# Patient Record
Sex: Female | Born: 1995 | Race: Black or African American | Hispanic: No | Smoking: Never smoker
Health system: Southern US, Community
[De-identification: ages and names within clinical notes are randomized; demographics above are authoritative.]

## PROBLEM LIST (undated history)

## (undated) ENCOUNTER — Ambulatory Visit (HOSPITAL_COMMUNITY): Admission: EM | Payer: Medicaid Other

## (undated) ENCOUNTER — Inpatient Hospital Stay (HOSPITAL_COMMUNITY): Payer: Self-pay

## (undated) DIAGNOSIS — Z8249 Family history of ischemic heart disease and other diseases of the circulatory system: Secondary | ICD-10-CM

## (undated) DIAGNOSIS — M549 Dorsalgia, unspecified: Secondary | ICD-10-CM

## (undated) DIAGNOSIS — F909 Attention-deficit hyperactivity disorder, unspecified type: Secondary | ICD-10-CM

## (undated) DIAGNOSIS — B009 Herpesviral infection, unspecified: Secondary | ICD-10-CM

## (undated) DIAGNOSIS — Z789 Other specified health status: Secondary | ICD-10-CM

## (undated) HISTORY — DX: Attention-deficit hyperactivity disorder, unspecified type: F90.9

## (undated) HISTORY — PX: NO PAST SURGERIES: SHX2092

## (undated) HISTORY — DX: Family history of ischemic heart disease and other diseases of the circulatory system: Z82.49

## (undated) HISTORY — DX: Dorsalgia, unspecified: M54.9

---
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
## 2016-05-01 ENCOUNTER — Encounter (HOSPITAL_COMMUNITY): Payer: Self-pay | Admitting: *Deleted

## 2016-05-01 ENCOUNTER — Emergency Department (HOSPITAL_COMMUNITY)
Admission: EM | Admit: 2016-05-01 | Discharge: 2016-05-01 | Disposition: A | Discharge status: Home / Self Care | Payer: Medicaid Other | Attending: Emergency Medicine | Admitting: Emergency Medicine

## 2016-05-01 DIAGNOSIS — N390 Urinary tract infection, site not specified: Secondary | ICD-10-CM

## 2016-05-01 DIAGNOSIS — Z3A01 Less than 8 weeks gestation of pregnancy: Secondary | ICD-10-CM | POA: Diagnosis not present

## 2016-05-01 DIAGNOSIS — O2341 Unspecified infection of urinary tract in pregnancy, first trimester: Secondary | ICD-10-CM | POA: Diagnosis not present

## 2016-05-01 DIAGNOSIS — O26891 Other specified pregnancy related conditions, first trimester: Secondary | ICD-10-CM | POA: Diagnosis present

## 2016-05-01 DIAGNOSIS — R531 Weakness: Secondary | ICD-10-CM | POA: Insufficient documentation

## 2016-05-01 DIAGNOSIS — Z3201 Encounter for pregnancy test, result positive: Secondary | ICD-10-CM | POA: Diagnosis not present

## 2016-05-01 LAB — CBC
HEMATOCRIT: 39.4 % (ref 36.0–46.0)
Hemoglobin: 13.1 g/dL (ref 12.0–15.0)
MCH: 28.4 pg (ref 26.0–34.0)
MCHC: 33.2 g/dL (ref 30.0–36.0)
MCV: 85.3 fL (ref 78.0–100.0)
Platelets: 249 10*3/uL (ref 150–400)
RBC: 4.62 MIL/uL (ref 3.87–5.11)
RDW: 14.3 % (ref 11.5–15.5)
WBC: 8.8 10*3/uL (ref 4.0–10.5)

## 2016-05-01 LAB — BASIC METABOLIC PANEL
Anion gap: 7 (ref 5–15)
BUN: 8 mg/dL (ref 6–20)
CO2: 22 mmol/L (ref 22–32)
Calcium: 9.5 mg/dL (ref 8.9–10.3)
Chloride: 105 mmol/L (ref 101–111)
Creatinine, Ser: 0.63 mg/dL (ref 0.44–1.00)
GFR calc Af Amer: >60 mL/min (ref >60)
GLUCOSE: 92 mg/dL (ref 65–99)
POTASSIUM: 3.6 mmol/L (ref 3.5–5.1)
Sodium: 134 mmol/L — ABNORMAL LOW (ref 135–145)

## 2016-05-01 LAB — URINALYSIS, ROUTINE W REFLEX MICROSCOPIC
BILIRUBIN URINE: NEGATIVE
GLUCOSE, UA: NEGATIVE mg/dL
HGB URINE DIPSTICK: NEGATIVE
KETONES UR: 15 mg/dL — AB
Nitrite: NEGATIVE
PH: 6.5 (ref 5.0–8.0)
Protein, ur: NEGATIVE mg/dL
Specific Gravity, Urine: 1.026 (ref 1.005–1.030)

## 2016-05-01 LAB — CBG MONITORING, ED: Glucose-Capillary: 92 mg/dL (ref 65–99)

## 2016-05-01 LAB — POC URINE PREG, ED: Preg Test, Ur: POSITIVE — AB

## 2016-05-01 LAB — URINE MICROSCOPIC-ADD ON: RBC / HPF: NONE SEEN RBC/hpf (ref 0–5)

## 2016-05-01 MED ORDER — CEPHALEXIN 500 MG PO CAPS: 500.0000 mg | ORAL_CAPSULE | Freq: Four times a day (QID) | ORAL | 0 refills | Status: DC

## 2016-05-01 MED ORDER — SODIUM CHLORIDE 0.9 % IV BOLUS (SEPSIS)
1000.0000 mL | Freq: Once | INTRAVENOUS | Status: AC
  Administered 2016-05-01: 1000 mL via INTRAVENOUS

## 2016-05-01 MED ORDER — DEXTROSE 5 % IV SOLN
1.0000 g | Freq: Once | INTRAVENOUS | Status: AC
  Administered 2016-05-01: 1 g via INTRAVENOUS
  Filled 2016-05-01: qty 10

## 2016-05-01 MED ORDER — ONDANSETRON HCL 4 MG/2ML IJ SOLN
4.0000 mg | Freq: Once | INTRAMUSCULAR | Status: AC
  Administered 2016-05-01: 4 mg via INTRAVENOUS
  Filled 2016-05-01: qty 2

## 2016-05-01 MED ORDER — PROMETHAZINE HCL 25 MG PO TABS: 25.0000 mg | ORAL_TABLET | Freq: Four times a day (QID) | ORAL | 0 refills | Status: DC | PRN

## 2016-05-01 NOTE — ED Notes (Signed)
Pt ambulatory and independent at discharge.  Verbalized understanding of discharge instructions 

## 2016-05-01 NOTE — Discharge Instructions (Signed)
Pregnancy test is positive. Follow-up with OB/GYN. Prescription for antibiotic for your urine. Also prescription for nausea medication.  Increase fluids.

## 2016-05-01 NOTE — ED Provider Notes (Signed)
[facility]-EMERGENCY DEPT Provider Note   CSN: [id number] Arrival date & time: 05/01/16  1742     History   Chief Complaint Chief Complaint  Patient presents with  . Weakness    HPI Chelsea Burgess is a 20 y.o. female.  Generalized weakness, blurred vision, mouth numbness, decreased appetite since yesterday. Last menstrual period September 17. No chronic medical conditions. She takes no medications. Patient reports no neurological deficits today. No fever, sweats, chills, vaginal bleeding, vaginal discharge, dysuria. Nothing makes symptoms better or worse.      History reviewed. No pertinent past medical history.  There are no active problems to display for this patient.   History reviewed. No pertinent surgical history.  OB History    No data available       Home Medications    Prior to Admission medications   Medication Sig Start Date End Date Taking? Authorizing Provider  cephALEXin (KEFLEX) 500 MG capsule Take 1 capsule (500 mg total) by mouth 4 (four) times daily. 05/01/16   Donnetta HutchingBrian Abria Vannostrand, MD  promethazine (PHENERGAN) 25 MG tablet Take 1 tablet (25 mg total) by mouth every 6 (six) hours as needed for nausea or vomiting. 05/01/16   Donnetta HutchingBrian Abdullahi Vallone, MD    Family History No family history on file.  Social History Social History  Substance Use Topics  . Smoking status: Never Smoker  . Smokeless tobacco: Never Used  . Alcohol use No     Allergies   Patient has no known allergies.   Review of Systems Review of Systems  All other systems reviewed and are negative.    Physical Exam Updated Vital Signs BP 110/82 (BP Location: Right Arm)   Pulse 76   Temp 97.9 F (36.6 C) (Oral)   Resp 21   Ht 5\' 7"  (1.702 m)   Wt 123 lb 6 oz (56 kg)   LMP 03/11/2016   SpO2 96%   BMI 19.32 kg/m   Physical Exam  Constitutional: She is oriented to person, place, and time. She appears well-developed and well-nourished.  Patient is not toxic appearing.  HENT:  Head:  Normocephalic and atraumatic.  Eyes: Conjunctivae are normal.  Neck: Neck supple.  Cardiovascular: Normal rate and regular rhythm.   Pulmonary/Chest: Effort normal and breath sounds normal.  Abdominal: Soft. Bowel sounds are normal.  Musculoskeletal: Normal range of motion.  Neurological: She is alert and oriented to person, place, and time.  Skin: Skin is warm and dry.  Psychiatric: She has a normal mood and affect. Her behavior is normal.  Nursing note and vitals reviewed.    ED Treatments / Results  Labs (all labs ordered are listed, but only abnormal results are displayed) Labs Reviewed  BASIC METABOLIC PANEL - Abnormal; Notable for the following:       Result Value   Sodium 134 (*)    All other components within normal limits  URINALYSIS, ROUTINE W REFLEX MICROSCOPIC (NOT AT [facility]) - Abnormal; Notable for the following:    APPearance CLOUDY (*)    Ketones, ur 15 (*)    Leukocytes, UA MODERATE (*)    All other components within normal limits  URINE MICROSCOPIC-ADD ON - Abnormal; Notable for the following:    Squamous Epithelial / LPF 0-5 (*)    Bacteria, UA FEW (*)    All other components within normal limits  POC URINE PREG, ED - Abnormal; Notable for the following:    Preg Test, Ur POSITIVE (*)    All other components  within normal limits  CBC  CBG MONITORING, ED    EKG  EKG Interpretation None       Radiology No results found.  Procedures Procedures (including critical care time)  Medications Ordered in ED Medications  sodium chloride 0.9 % bolus 1,000 mL (not administered)  ondansetron (ZOFRAN) injection 4 mg (4 mg Intravenous Given 05/01/16 2127)  cefTRIAXone (ROCEPHIN) 1 g in dextrose 5 % 50 mL IVPB (0 g Intravenous Stopped 05/01/16 2203)  sodium chloride 0.9 % bolus 1,000 mL (1,000 mLs Intravenous New Bag/Given 05/01/16 2128)     Initial Impression / Assessment and Plan / ED Course  I have reviewed the triage vital signs and the nursing  notes.  Pertinent labs & imaging results that were available during my care of the patient were reviewed by me and considered in my medical decision making (see chart for details).  Clinical Course    Patient is nontoxic-appearing. No neurological deficits. She feels better after IV fluids. Discussed her pregnancy test.  Discharge medications Keflex 500 mg qid and Phenergan 25 mg. She will get OB/GYN follow-up.   Final Clinical Impressions(s) / ED Diagnoses   Final diagnoses:  Less than [redacted] weeks gestation of pregnancy  Urinary tract infection without hematuria, site unspecified    New Prescriptions New Prescriptions   CEPHALEXIN (KEFLEX) 500 MG CAPSULE    Take 1 capsule (500 mg total) by mouth 4 (four) times daily.   PROMETHAZINE (PHENERGAN) 25 MG TABLET    Take 1 tablet (25 mg total) by mouth every 6 (six) hours as needed for nausea or vomiting.     Donnetta HutchingBrian Narcissus Detwiler, MD 05/01/16 2242

## 2016-05-01 NOTE — Progress Notes (Addendum)
Patient listed as having Medicaid insurance without a pcp.  Pcp listed on patient's insurance card is located at [facility], [phone]  [phone].  System updated.  EDCM spoke to patient at bedside who confirms her pcp is [facility] at [facility].

## 2016-05-01 NOTE — ED Triage Notes (Signed)
Pt reports she felt numbness in her right hand and entire mouth, blurred vision confused speech, and fatigue last night. Pt states she woke up today and her symptoms had resolved. Pt states she does feel weak and has a lack of appetite.

## 2016-10-03 ENCOUNTER — Encounter (HOSPITAL_COMMUNITY): Payer: Self-pay | Admitting: *Deleted

## 2016-10-03 ENCOUNTER — Emergency Department (HOSPITAL_COMMUNITY)
Admission: EM | Admit: 2016-10-03 | Discharge: 2016-10-03 | Disposition: A | Discharge status: Home / Self Care | Payer: Medicaid Other | Attending: Emergency Medicine | Admitting: Emergency Medicine

## 2016-10-03 DIAGNOSIS — Z79899 Other long term (current) drug therapy: Secondary | ICD-10-CM | POA: Insufficient documentation

## 2016-10-03 DIAGNOSIS — K0889 Other specified disorders of teeth and supporting structures: Secondary | ICD-10-CM | POA: Insufficient documentation

## 2016-10-03 MED ORDER — PENICILLIN V POTASSIUM 500 MG PO TABS: 500.0000 mg | ORAL_TABLET | Freq: Four times a day (QID) | ORAL | 0 refills | Status: AC

## 2016-10-03 MED ORDER — IBUPROFEN 800 MG PO TABS: 800.0000 mg | ORAL_TABLET | Freq: Three times a day (TID) | ORAL | 0 refills | Status: DC | PRN

## 2016-10-03 MED ORDER — PENICILLIN V POTASSIUM 500 MG PO TABS: 500.0000 mg | ORAL_TABLET | Freq: Once | ORAL | Status: DC

## 2016-10-03 NOTE — ED Provider Notes (Signed)
[facility]-EMERGENCY DEPT Provider Note   CSN: [id number] Arrival date & time: 10/03/16  2115  By signing my name below, I, Linna Darner, attest that this documentation has been prepared under the direction and in the presence of [facility], PA-C. Electronically Signed: Linna Darner, Scribe. 10/03/2016. 9:56 PM.  History   Chief Complaint Chief Complaint  Patient presents with  . Dental Pain    The history is provided by the patient. No language interpreter was used.     HPI Comments: Chelsea Burgess is a 21 y.o. female who presents to the Emergency Department complaining of constant, aching, throbbing, 8/10 dental pain beginning three days ago. She states she developed pain in her right lower teeth three days ago and pain in her right upper teeth yesterday. Patient tried Tylenol yesterday with no improvement of her pain and has not tried any medications or treatments today. She endorses pain exacerbation with applied pressure to several of her right teeth. No h/o the same. Her last dental cleaning was this past January and she was told she had no cavities. Pt states she is now without dental insurance and will not be able to attend her next scheduled cleaning appointment in three months. She is a non-smoker. Patient denies fevers, chills, trouble swallowing, or any other associated symptoms.  History reviewed. No pertinent past medical history.  There are no active problems to display for this patient.   History reviewed. No pertinent surgical history.  OB History    No data available       Home Medications    Prior to Admission medications   Medication Sig Start Date End Date Taking? Authorizing Provider  cephALEXin (KEFLEX) 500 MG capsule Take 1 capsule (500 mg total) by mouth 4 (four) times daily. 05/01/16   Donnetta Hutching, MD  ibuprofen (ADVIL,MOTRIN) 800 MG tablet Take 1 tablet (800 mg total) by mouth every 8 (eight) hours as needed. 10/03/16   Chase Picket Ward, PA-C    penicillin v potassium (VEETID) 500 MG tablet Take 1 tablet (500 mg total) by mouth 4 (four) times daily. 10/03/16 10/10/16  Chase Picket Ward, PA-C  promethazine (PHENERGAN) 25 MG tablet Take 1 tablet (25 mg total) by mouth every 6 (six) hours as needed for nausea or vomiting. 05/01/16   Donnetta Hutching, MD    Family History No family history on file.  Social History Social History  Substance Use Topics  . Smoking status: Never Smoker  . Smokeless tobacco: Never Used  . Alcohol use No     Allergies   Patient has no known allergies.   Review of Systems Review of Systems  Constitutional: Negative for chills and fever.  HENT: Positive for dental problem. Negative for trouble swallowing.    Physical Exam Updated Vital Signs BP 131/81 (BP Location: Right Arm)   Pulse 69   Temp 98.1 F (36.7 C) (Oral)   Resp 16   Ht  (1.702 m)   Wt 54.4 kg   SpO2 99%   BMI 18.79 kg/m   Physical Exam  Constitutional: She is oriented to person, place, and time. She appears well-developed and well-nourished. No distress.  HENT:  Head: Normocephalic and atraumatic.  Mouth/Throat:    Dental cavities and poor oral dentition noted, pain along tooth as depicted in image, midline uvula, no trismus, oropharynx moist and clear, no abscess noted, no oropharyngeal erythema or edema, neck supple and no tenderness. No facial edema  Cardiovascular: Normal rate, regular rhythm and normal heart  sounds.   No murmur heard. Pulmonary/Chest: Effort normal and breath sounds normal. No respiratory distress.  Musculoskeletal: She exhibits no edema.  Neurological: She is alert and oriented to person, place, and time.  Skin: Skin is warm and dry.  Nursing note and vitals reviewed.  ED Treatments / Results  Labs (all labs ordered are listed, but only abnormal results are displayed) Labs Reviewed - No data to display  EKG  EKG Interpretation None       Radiology No results  found.  Procedures Procedures (including critical care time)  DIAGNOSTIC STUDIES: Oxygen Saturation is 99% on RA, normal by my interpretation.    COORDINATION OF CARE: 10:00 PM Discussed treatment plan with pt at bedside and pt agreed to plan.  Medications Ordered in ED Medications  penicillin v potassium (VEETID) tablet 500 mg (not administered)     Initial Impression / Assessment and Plan / ED Course  I have reviewed the triage vital signs and the nursing notes.  Pertinent labs & imaging results that were available during my care of the patient were reviewed by me and considered in my medical decision making (see chart for details).    Patient with dentalgia. No abscess requiring immediate incision and drainage. Patient is afebrile, non toxic appearing, and swallowing secretions well. Exam not concerning for Ludwig's angina or pharyngeal abscess. Will treat with PenVK. I provided dental resource guide and stressed the importance of dental follow up for ultimate management of dental pain. Patient voices understanding and is agreeable to plan.   Final Clinical Impressions(s) / ED Diagnoses   Final diagnoses:  Pain, dental    New Prescriptions Discharge Medication List as of 10/03/2016 10:14 PM    START taking these medications   Details  ibuprofen (ADVIL,MOTRIN) 800 MG tablet Take 1 tablet (800 mg total) by mouth every 8 (eight) hours as needed., Starting Wed 10/03/2016, Print    penicillin v potassium (VEETID) 500 MG tablet Take 1 tablet (500 mg total) by mouth 4 (four) times daily., Starting Wed 10/03/2016, Until Wed 10/10/2016, Print      I personally performed the services described in this documentation, which was scribed in my presence. The recorded information has been reviewed and is accurate.    [facility], PA-C 10/03/16 2226    Nira Conn, MD 10/04/16 [phone]

## 2016-10-03 NOTE — ED Triage Notes (Signed)
Right upper and lower dental pain for 3 days. No meds just PTA

## 2016-10-03 NOTE — Discharge Instructions (Signed)
You have a dental infection. It is very important that you get evaluated by a dentist as soon as possible. Call tomorrow to schedule an appointment. Ibuprofen as needed for pain. Take your full course of antibiotics. Read the instructions below. ° °Eat a soft or liquid diet and rinse your mouth out after meals with warm water. You should see a dentist or return here at once if you have increased swelling, increased pain or uncontrolled bleeding from the site of your injury. ° °SEEK MEDICAL CARE IF:  °You have increased pain not controlled with medicines.  °You have swelling around your tooth, in your face or neck.  °You have bleeding which starts, continues, or gets worse.  °You have a fever >101 °If you are unable to open your mouth °

## 2017-05-17 ENCOUNTER — Other Ambulatory Visit: Payer: Self-pay

## 2017-05-17 ENCOUNTER — Emergency Department (HOSPITAL_COMMUNITY)
Admission: EM | Admit: 2017-05-17 | Discharge: 2017-05-17 | Disposition: A | Discharge status: Home / Self Care | Payer: Self-pay | Attending: Emergency Medicine | Admitting: Emergency Medicine

## 2017-05-17 ENCOUNTER — Encounter (HOSPITAL_COMMUNITY): Payer: Self-pay | Admitting: Emergency Medicine

## 2017-05-17 DIAGNOSIS — A6004 Herpesviral vulvovaginitis: Secondary | ICD-10-CM | POA: Insufficient documentation

## 2017-05-17 DIAGNOSIS — Z79899 Other long term (current) drug therapy: Secondary | ICD-10-CM | POA: Insufficient documentation

## 2017-05-17 LAB — URINALYSIS, ROUTINE W REFLEX MICROSCOPIC
Bilirubin Urine: NEGATIVE
Glucose, UA: NEGATIVE mg/dL
HGB URINE DIPSTICK: NEGATIVE
Ketones, ur: NEGATIVE mg/dL
NITRITE: NEGATIVE
Protein, ur: NEGATIVE mg/dL
SPECIFIC GRAVITY, URINE: 1.017 (ref 1.005–1.030)
pH: 5 (ref 5.0–8.0)

## 2017-05-17 LAB — PREGNANCY, URINE: PREG TEST UR: NEGATIVE

## 2017-05-17 MED ORDER — VALACYCLOVIR HCL 500 MG PO TABS
500.0000 mg | ORAL_TABLET | Freq: Once | ORAL | Status: AC
  Administered 2017-05-17: 500 mg via ORAL
  Filled 2017-05-17: qty 1

## 2017-05-17 MED ORDER — LIDOCAINE 5 % EX OINT: 1.0000 "application " | TOPICAL_OINTMENT | CUTANEOUS | 0 refills | Status: DC | PRN

## 2017-05-17 MED ORDER — VALACYCLOVIR HCL 500 MG PO TABS: 500.0000 mg | ORAL_TABLET | Freq: Two times a day (BID) | ORAL | 0 refills | Status: DC

## 2017-05-17 MED ORDER — TRAMADOL HCL 50 MG PO TABS: 50.0000 mg | ORAL_TABLET | Freq: Four times a day (QID) | ORAL | 0 refills | Status: DC | PRN

## 2017-05-17 NOTE — ED Provider Notes (Signed)
[facility] Provider Burgess   CSN: [id number] Arrival date & time: 05/17/17  0428     History   Chief Complaint Chief Complaint  Patient presents with  . Vaginal Pain    HPI Chelsea Burgess is a 21 y.o. female.  Patient presents with complaints of a 2-day history of painful blisters on her labia.  She reports a history of genital herpes with similar symptoms.  Patient denies any recent unprotected sex.  She has not had any vaginal discharge.  She reports constant pain at the site of the lesions.      History reviewed. No pertinent past medical history.  There are no active problems to display for this patient.   History reviewed. No pertinent surgical history.  OB History    No data available       Home Medications    Prior to Admission medications   Medication Sig Start Date End Date Taking? Authorizing Provider  cephALEXin (KEFLEX) 500 MG capsule Take 1 capsule (500 mg total) by mouth 4 (four) times daily. 05/01/16   Donnetta Hutchingook, Brian, MD  ibuprofen (ADVIL,MOTRIN) 800 MG tablet Take 1 tablet (800 mg total) by mouth every 8 (eight) hours as needed. 10/03/16   Ward, Chase PicketJaime Pilcher, PA-C  lidocaine (XYLOCAINE) 5 % ointment Apply 1 application topically as needed. For painful lesion 05/17/17   Gilda CreasePollina, Sherleen Pangborn J, MD  promethazine (PHENERGAN) 25 MG tablet Take 1 tablet (25 mg total) by mouth every 6 (six) hours as needed for nausea or vomiting. 05/01/16   Donnetta Hutchingook, Brian, MD  traMADol (ULTRAM) 50 MG tablet Take 1 tablet (50 mg total) by mouth every 6 (six) hours as needed. 05/17/17   Gilda CreasePollina, Donelle Baba J, MD  valACYclovir (VALTREX) 500 MG tablet Take 1 tablet (500 mg total) by mouth 2 (two) times daily. 05/17/17   Gilda CreasePollina, Kaysen Sefcik J, MD    Family History No family history on file.  Social History Social History   Tobacco Use  . Smoking status: Never Smoker  . Smokeless tobacco: Never Used  Substance Use Topics    . Alcohol use: No  . Drug use: No     Allergies   Patient has no known allergies.   Review of Systems Review of Systems  Genitourinary: Positive for genital sores.  All other systems reviewed and are negative.    Physical Exam Updated Vital Signs BP 126/73 (BP Location: Left Arm)   Pulse 80   Temp 97.6 F (36.4 C) (Oral)   Resp 18   LMP 05/07/2017   SpO2 100%   Physical Exam  Constitutional: She is oriented to person, place, and time. She appears well-developed and well-nourished. No distress.  HENT:  Head: Normocephalic and atraumatic.  Right Ear: Hearing normal.  Left Ear: Hearing normal.  Nose: Nose normal.  Mouth/Throat: Oropharynx is clear and moist and mucous membranes are normal.  Eyes: Conjunctivae and EOM are normal. Pupils are equal, round, and reactive to light.  Neck: Normal range of motion. Neck supple.  Cardiovascular: Regular rhythm, S1 normal and S2 normal. Exam reveals no gallop and no friction rub.  No murmur heard. Pulmonary/Chest: Effort normal and breath sounds normal. No respiratory distress. She exhibits no tenderness.  Abdominal: Soft. Normal appearance and bowel sounds are normal. There is no hepatosplenomegaly. There is no tenderness. There is no rebound, no guarding, no tenderness at McBurney's point and negative Murphy's sign. No hernia.  Genitourinary:  Genitourinary Comments: Physical examination reveals ulcerated area  of bilateral labia consistent with herpetic lesions.  Speculum exam deferred secondary to external pain.  Musculoskeletal: Normal range of motion.  Neurological: She is alert and oriented to person, place, and time. She has normal strength. No cranial nerve deficit or sensory deficit. Coordination normal. GCS eye subscore is 4. GCS verbal subscore is 5. GCS motor subscore is 6.  Skin: Skin is warm, dry and intact. No rash noted. No cyanosis.  Psychiatric: She has a normal mood and affect. Her speech is normal and behavior is  normal. Thought content normal.  Nursing Burgess and vitals reviewed.    ED Treatments / Results  Labs (all labs ordered are listed, but only abnormal results are displayed) Labs Reviewed  URINALYSIS, ROUTINE W REFLEX MICROSCOPIC - Abnormal; Notable for the following components:      Result Value   APPearance HAZY (*)    Leukocytes, UA SMALL (*)    Bacteria, UA RARE (*)    Squamous Epithelial / LPF 6-30 (*)    All other components within normal limits  PREGNANCY, URINE  GC/CHLAMYDIA PROBE AMP ([place]) NOT [facility]    EKG  EKG Interpretation None       Radiology No results found.  Procedures Procedures (including critical care time)  Medications Ordered in ED Medications  valACYclovir (VALTREX) tablet 500 mg (not administered)     Initial Impression / Assessment and Plan / ED Course  I have reviewed the triage vital signs and the nursing notes.  Pertinent labs & imaging results that were available during my care of the patient were reviewed by me and considered in my medical decision making (see chart for details).     Patient presents with genital lesions consistent with genital herpes.  Full examination not possible secondary to multiple lesions and pain.  She denies any recent sexual activity, no signs of PID or other STD.  Will add GC and chlamydia to urine collected.  Final Clinical Impressions(s) / ED Diagnoses   Final diagnoses:  Herpes simplex vulvovaginitis    ED Discharge Orders        Ordered    valACYclovir (VALTREX) 500 MG tablet  2 times daily     05/17/17 0634    traMADol (ULTRAM) 50 MG tablet  Every 6 hours PRN     05/17/17 0634    lidocaine (XYLOCAINE) 5 % ointment  As needed     05/17/17 0634       Gilda CreasePollina, Dhruvan Gullion J, MD 05/17/17 [phone]

## 2017-05-17 NOTE — ED Triage Notes (Signed)
Pt complaint of vaginal blisters for 2 days; verbalizes "I think I have an outbreak and need medicine."

## 2017-05-20 LAB — GC/CHLAMYDIA PROBE AMP (~~LOC~~) NOT AT ARMC
CHLAMYDIA, DNA PROBE: POSITIVE — AB
Neisseria Gonorrhea: NEGATIVE

## 2017-05-23 ENCOUNTER — Telehealth: Payer: Self-pay | Admitting: Student

## 2017-05-23 DIAGNOSIS — A749 Chlamydial infection, unspecified: Secondary | ICD-10-CM

## 2017-05-23 MED ORDER — AZITHROMYCIN 500 MG PO TABS: 1000.0000 mg | ORAL_TABLET | Freq: Once | ORAL | 0 refills | Status: AC

## 2017-05-23 NOTE — Telephone Encounter (Addendum)
Chelsea Burgess tested positive for  Chlamydia. Patient was called by RN and allergies and pharmacy confirmed. Rx sent to pharmacy of choice.   Judeth HornLawrence, Torsha Lemus, NP 05/23/2017 2:15 PM       ----- Message from Kathe BectonLori S Berdik, RN sent at 05/23/2017 11:38 AM EST ----- This patient tested positive for :  chlamydia   She :"has NKDA", I have informed the patient of her results and confirmed her pharmacy is correct in her chart. Please send Rx.   Thank you,   Kathe BectonBerdik, Lori S, RN   [facility]

## 2017-12-21 ENCOUNTER — Emergency Department (HOSPITAL_COMMUNITY): Payer: Self-pay

## 2017-12-21 ENCOUNTER — Emergency Department (HOSPITAL_COMMUNITY)
Admission: EM | Admit: 2017-12-21 | Discharge: 2017-12-21 | Disposition: A | Discharge status: Home / Self Care | Payer: Self-pay | Attending: Emergency Medicine | Admitting: Emergency Medicine

## 2017-12-21 ENCOUNTER — Other Ambulatory Visit: Payer: Self-pay

## 2017-12-21 ENCOUNTER — Encounter (HOSPITAL_COMMUNITY): Payer: Self-pay | Admitting: Emergency Medicine

## 2017-12-21 DIAGNOSIS — N390 Urinary tract infection, site not specified: Secondary | ICD-10-CM | POA: Insufficient documentation

## 2017-12-21 DIAGNOSIS — N83201 Unspecified ovarian cyst, right side: Secondary | ICD-10-CM | POA: Insufficient documentation

## 2017-12-21 DIAGNOSIS — R52 Pain, unspecified: Secondary | ICD-10-CM

## 2017-12-21 DIAGNOSIS — B373 Candidiasis of vulva and vagina: Secondary | ICD-10-CM | POA: Insufficient documentation

## 2017-12-21 DIAGNOSIS — B3731 Acute candidiasis of vulva and vagina: Secondary | ICD-10-CM

## 2017-12-21 DIAGNOSIS — N72 Inflammatory disease of cervix uteri: Secondary | ICD-10-CM | POA: Insufficient documentation

## 2017-12-21 LAB — URINALYSIS, ROUTINE W REFLEX MICROSCOPIC
Bilirubin Urine: NEGATIVE
GLUCOSE, UA: NEGATIVE mg/dL
KETONES UR: NEGATIVE mg/dL
NITRITE: POSITIVE — AB
PH: 6 (ref 5.0–8.0)
Protein, ur: NEGATIVE mg/dL
Specific Gravity, Urine: 1.012 (ref 1.005–1.030)

## 2017-12-21 LAB — COMPREHENSIVE METABOLIC PANEL
ALT: 12 U/L (ref 0–44)
AST: 14 U/L — AB (ref 15–41)
Albumin: 3.8 g/dL (ref 3.5–5.0)
Alkaline Phosphatase: 59 U/L (ref 38–126)
Anion gap: 7 (ref 5–15)
BILIRUBIN TOTAL: 0.2 mg/dL — AB (ref 0.3–1.2)
BUN: 8 mg/dL (ref 6–20)
CO2: 24 mmol/L (ref 22–32)
CREATININE: 0.69 mg/dL (ref 0.44–1.00)
Calcium: 9.4 mg/dL (ref 8.9–10.3)
Chloride: 107 mmol/L (ref 98–111)
GFR calc Af Amer: >60 mL/min (ref >60)
GFR calc non Af Amer: >60 mL/min (ref >60)
Glucose, Bld: 116 mg/dL — ABNORMAL HIGH (ref 70–99)
Potassium: 3.4 mmol/L — ABNORMAL LOW (ref 3.5–5.1)
Sodium: 138 mmol/L (ref 135–145)
TOTAL PROTEIN: 6.7 g/dL (ref 6.5–8.1)

## 2017-12-21 LAB — CBC WITH DIFFERENTIAL/PLATELET
ABS IMMATURE GRANULOCYTES: 0 10*3/uL (ref 0.0–0.1)
Basophils Absolute: 0 10*3/uL (ref 0.0–0.1)
Basophils Relative: 0 %
EOS ABS: 0.1 10*3/uL (ref 0.0–0.7)
Eosinophils Relative: 2 %
HEMATOCRIT: 41 % (ref 36.0–46.0)
Hemoglobin: 13.2 g/dL (ref 12.0–15.0)
IMMATURE GRANULOCYTES: 0 %
LYMPHS ABS: 1.7 10*3/uL (ref 0.7–4.0)
Lymphocytes Relative: 21 %
MCH: 28.6 pg (ref 26.0–34.0)
MCHC: 32.2 g/dL (ref 30.0–36.0)
MCV: 88.7 fL (ref 78.0–100.0)
MONO ABS: 0.6 10*3/uL (ref 0.1–1.0)
MONOS PCT: 7 %
NEUTROS PCT: 70 %
Neutro Abs: 5.7 10*3/uL (ref 1.7–7.7)
Platelets: 255 10*3/uL (ref 150–400)
RBC: 4.62 MIL/uL (ref 3.87–5.11)
RDW: 13 % (ref 11.5–15.5)
WBC: 8.1 10*3/uL (ref 4.0–10.5)

## 2017-12-21 LAB — POC URINE PREG, ED: Preg Test, Ur: NEGATIVE

## 2017-12-21 LAB — WET PREP, GENITAL
Sperm: NONE SEEN
Trich, Wet Prep: NONE SEEN

## 2017-12-21 LAB — LIPASE, BLOOD: LIPASE: 32 U/L (ref 11–51)

## 2017-12-21 MED ORDER — KETOROLAC TROMETHAMINE 60 MG/2ML IM SOLN
60.0000 mg | Freq: Once | INTRAMUSCULAR | Status: AC
  Administered 2017-12-21: 60 mg via INTRAMUSCULAR
  Filled 2017-12-21: qty 2

## 2017-12-21 MED ORDER — AZITHROMYCIN 250 MG PO TABS
500.0000 mg | ORAL_TABLET | Freq: Once | ORAL | Status: AC
  Administered 2017-12-21: 500 mg via ORAL
  Filled 2017-12-21: qty 2

## 2017-12-21 MED ORDER — CEFTRIAXONE SODIUM 250 MG IJ SOLR
250.0000 mg | Freq: Once | INTRAMUSCULAR | Status: AC
  Administered 2017-12-21: 250 mg via INTRAMUSCULAR
  Filled 2017-12-21: qty 250

## 2017-12-21 MED ORDER — IBUPROFEN 600 MG PO TABS: 600.0000 mg | ORAL_TABLET | Freq: Four times a day (QID) | ORAL | 0 refills | Status: DC | PRN

## 2017-12-21 MED ORDER — CEPHALEXIN 500 MG PO CAPS: 500.0000 mg | ORAL_CAPSULE | Freq: Three times a day (TID) | ORAL | 0 refills | Status: DC

## 2017-12-21 MED ORDER — METRONIDAZOLE 500 MG PO TABS: 500.0000 mg | ORAL_TABLET | Freq: Two times a day (BID) | ORAL | 0 refills | Status: DC

## 2017-12-21 MED ORDER — FLUCONAZOLE 150 MG PO TABS
150.0000 mg | ORAL_TABLET | Freq: Once | ORAL | Status: AC
  Administered 2017-12-21: 150 mg via ORAL
  Filled 2017-12-21: qty 1

## 2017-12-21 NOTE — ED Triage Notes (Signed)
Pt presents to ED for assessment of 3 days of right flank pain with some radiation to her right abdomen.  Denies changes in urination

## 2017-12-21 NOTE — ED Notes (Signed)
Pt ambulatory and verbalizes understanding of dc instructions. No s/sx distress noted.

## 2017-12-21 NOTE — ED Provider Notes (Signed)
[facility] EMERGENCY DEPARTMENT Provider Note   CSN: [id number] Arrival date & time: 12/21/17  1514     History   Chief Complaint Chief Complaint  Patient presents with  . Flank Pain    HPI Chelsea Burgess is a 22 y.o. female.  HPI Patient presents with 3 days of right-sided abdominal pain radiating to her right flank.  States the pain is been episodic but then became constant.  Not associated with eating.  No nausea or vomiting.  No vaginal discharge or bleeding.  Denies dysuria, hematuria, frequency or urgency.  No previous abdominal surgeries.  Patient is sexually active and is inconsistent with using barrier protection. History reviewed. No pertinent past medical history.  There are no active problems to display for this patient.   History reviewed. No pertinent surgical history.   OB History   None      Home Medications    Prior to Admission medications   Medication Sig Start Date End Date Taking? Authorizing Provider  cephALEXin (KEFLEX) 500 MG capsule Take 1 capsule (500 mg total) by mouth 3 (three) times daily. 12/21/17   Loren Racer, MD  ibuprofen (ADVIL,MOTRIN) 600 MG tablet Take 1 tablet (600 mg total) by mouth every 6 (six) hours as needed. 12/21/17   Loren Racer, MD  metroNIDAZOLE (FLAGYL) 500 MG tablet Take 1 tablet (500 mg total) by mouth 2 (two) times daily. One po bid x 7 days 12/21/17   Loren Racer, MD    Family History History reviewed. No pertinent family history.  Social History Social History   Tobacco Use  . Smoking status: Never Smoker  . Smokeless tobacco: Never Used  Substance Use Topics  . Alcohol use: Not Currently  . Drug use: Never     Allergies   Patient has no known allergies.   Review of Systems Review of Systems  Constitutional: Negative for chills and fever.  Eyes: Negative for visual disturbance.  Respiratory: Negative for cough and shortness of breath.   Cardiovascular: Negative for  chest pain.  Gastrointestinal: Positive for abdominal pain. Negative for constipation, diarrhea, nausea and vomiting.  Genitourinary: Positive for flank pain. Negative for difficulty urinating, dyspareunia, dysuria, frequency, menstrual problem, vaginal bleeding and vaginal discharge.  Musculoskeletal: Positive for back pain and myalgias. Negative for neck pain and neck stiffness.  Skin: Negative for rash and wound.  Neurological: Negative for dizziness, weakness, numbness and headaches.  All other systems reviewed and are negative.    Physical Exam Updated Vital Signs BP 133/87 (BP Location: Right Arm)   Pulse 67   Temp 98.5 F (36.9 C) (Oral)   Resp 18   LMP 12/09/2017   SpO2 100%   Physical Exam  Constitutional: She is oriented to person, place, and time. She appears well-developed and well-nourished.  HENT:  Head: Normocephalic and atraumatic.  Mouth/Throat: Oropharynx is clear and moist.  Eyes: Pupils are equal, round, and reactive to light. EOM are normal.  Neck: Normal range of motion. Neck supple.  Cardiovascular: Normal rate and regular rhythm.  Pulmonary/Chest: Effort normal and breath sounds normal.  Abdominal: Soft. Bowel sounds are normal. There is tenderness. There is no rebound and no guarding.  Patient with mild right upper and lower quadrant tenderness to palpation without rebound or guarding.    Genitourinary:  Genitourinary Comments: Red cervix with moderate amount of frothy yellow discharge.  No cervical motion tenderness.  No adnexal or fundal tenderness.  Musculoskeletal: Normal range of motion. She exhibits no edema  or tenderness.  Question mild left CVA tenderness.  No midline thoracic or lumbar tenderness.  No lower extremity swelling, asymmetry or tenderness.  Neurological: She is alert and oriented to person, place, and time.  Moves all extremities without focal deficit.  Sensation intact.  Skin: Skin is warm and dry. Capillary refill takes less than 2  seconds. No rash noted. No erythema.  Psychiatric: She has a normal mood and affect. Her behavior is normal.  Nursing note and vitals reviewed.    ED Treatments / Results  Labs (all labs ordered are listed, but only abnormal results are displayed) Labs Reviewed  WET PREP, GENITAL - Abnormal; Notable for the following components:      Result Value   Yeast Wet Prep HPF POC PRESENT (*)    Clue Cells Wet Prep HPF POC PRESENT (*)    WBC, Wet Prep HPF POC MANY (*)    All other components within normal limits  URINALYSIS, ROUTINE W REFLEX MICROSCOPIC - Abnormal; Notable for the following components:   APPearance HAZY (*)    Hgb urine dipstick SMALL (*)    Nitrite POSITIVE (*)    Leukocytes, UA MODERATE (*)    WBC, UA >50 (*)    Bacteria, UA FEW (*)    All other components within normal limits  COMPREHENSIVE METABOLIC PANEL - Abnormal; Notable for the following components:   Potassium 3.4 (*)    Glucose, Bld 116 (*)    AST 14 (*)    Total Bilirubin 0.2 (*)    All other components within normal limits  URINE CULTURE  CBC WITH DIFFERENTIAL/PLATELET  LIPASE, BLOOD  POC URINE PREG, ED  GC/CHLAMYDIA PROBE AMP ([place]    EKG None  Radiology [place] Pelvis Transvanginal Non-ob (tv Only)  Result Date: 12/21/2017 CLINICAL DATA:  22 year old female with right lower quadrant abdominal pain. EXAM: TRANSABDOMINAL AND TRANSVAGINAL ULTRASOUND OF PELVIS TECHNIQUE: Both transabdominal and transvaginal ultrasound examinations of the pelvis were performed. Transabdominal technique was performed for global imaging of the pelvis including uterus, ovaries, adnexal regions, and pelvic cul-de-sac. It was necessary to proceed with endovaginal exam following the transabdominal exam to visualize the endometrium and ovaries. COMPARISON:  Abdominal CT dated 12/21/2017 FINDINGS: Uterus Measurements: 7.1 x 4.2 x 4.7 cm. The uterus is retroverted. Normal echogenicity. No mass. Endometrium Thickness:  10 mm.  No focal abnormality visualized. Right ovary Measurements: 3.3 x 1.7 x 2.2 cm. There is a 1.4 x 0.8 x 1.3 cm hemorrhagic corpus luteum in the right ovary. Left ovary Measurements: 2.8 x 1.0 x 1.1 cm. Normal appearance/no adnexal mass. Other findings Small amount of minimally complex fluid within the pelvis IMPRESSION: 1. Right ovarian complex/hemorrhagic corpus luteum with a small free fluid within the pelvis. 2. Unremarkable uterus and the left ovary. Electronically Signed   By: Elgie CollardArash  Radparvar M.D.   On: 12/21/2017 21:16   [place] Pelvis Complete  Result Date: 12/21/2017 CLINICAL DATA:  22 year old female with right lower quadrant abdominal pain. EXAM: TRANSABDOMINAL AND TRANSVAGINAL ULTRASOUND OF PELVIS TECHNIQUE: Both transabdominal and transvaginal ultrasound examinations of the pelvis were performed. Transabdominal technique was performed for global imaging of the pelvis including uterus, ovaries, adnexal regions, and pelvic cul-de-sac. It was necessary to proceed with endovaginal exam following the transabdominal exam to visualize the endometrium and ovaries. COMPARISON:  Abdominal CT dated 12/21/2017 FINDINGS: Uterus Measurements: 7.1 x 4.2 x 4.7 cm. The uterus is retroverted. Normal echogenicity. No mass. Endometrium Thickness: 10 mm.  No focal  abnormality visualized. Right ovary Measurements: 3.3 x 1.7 x 2.2 cm. There is a 1.4 x 0.8 x 1.3 cm hemorrhagic corpus luteum in the right ovary. Left ovary Measurements: 2.8 x 1.0 x 1.1 cm. Normal appearance/no adnexal mass. Other findings Small amount of minimally complex fluid within the pelvis IMPRESSION: 1. Right ovarian complex/hemorrhagic corpus luteum with a small free fluid within the pelvis. 2. Unremarkable uterus and the left ovary. Electronically Signed   By: Elgie Collard M.D.   On: 12/21/2017 21:16   Ct Renal Stone Study  Result Date: 12/21/2017 CLINICAL DATA:  Right flank pain. EXAM: CT ABDOMEN AND PELVIS WITHOUT CONTRAST TECHNIQUE:  Multidetector CT imaging of the abdomen and pelvis was performed following the standard protocol without IV contrast. COMPARISON:  None. FINDINGS: Lower chest: No acute abnormality. Hepatobiliary: No focal liver abnormality is seen. No gallstones, gallbladder wall thickening, or biliary dilatation. Pancreas: Unremarkable. No pancreatic ductal dilatation or surrounding inflammatory changes. Spleen: Normal in size without focal abnormality. Adrenals/Urinary Tract: Adrenal glands are unremarkable. Kidneys are normal, without renal calculi, focal lesion, or hydronephrosis. Bladder is unremarkable. Stomach/Bowel: The stomach and small bowel are normal. The colon and visualized portions of the appendix are normal. No secondary evidence of appendicitis. Vascular/Lymphatic: No significant vascular findings are present. No enlarged abdominal or pelvic lymph nodes. Reproductive: The uterus and left ovary are normal. No right ovarian mass noted. There is a small amount of fluid posteriorly in the right pelvis, likely posterior to the right ovary. Other: There is a small amount of fluid in the pelvis, likely posterior to the ovary on the right. Musculoskeletal: No acute or significant osseous findings. IMPRESSION: 1. There is a small amount of fluid in the right posterior pelvis, likely posterior to the ovary. The patient's pain could be adnexal in the appropriate clinical setting. If clinically warranted, a pelvic ultrasound could better evaluate the ovaries. 2. Visualized portions of the appendix are normal. No evidence of appendicitis. 3. No other acute abnormalities. Electronically Signed   By: Gerome Sam III M.D   On: 12/21/2017 19:36    Procedures Procedures (including critical care time)  Medications Ordered in ED Medications  ketorolac (TORADOL) injection 60 mg (60 mg Intramuscular Given 12/21/17 1907)  cefTRIAXone (ROCEPHIN) injection 250 mg (250 mg Intramuscular Given 12/21/17 2252)  azithromycin  (ZITHROMAX) tablet 500 mg (500 mg Oral Given 12/21/17 2252)  fluconazole (DIFLUCAN) tablet 150 mg (150 mg Oral Given 12/21/17 2308)     Initial Impression / Assessment and Plan / ED Course  I have reviewed the triage vital signs and the nursing notes.  Pertinent labs & imaging results that were available during my care of the patient were reviewed by me and considered in my medical decision making (see chart for details).     CT abdomen pelvis with small amount of pelvic free fluid in the right pelvis.  No renal stone disease.  No evidence of appendicitis.  Pelvic ultrasound with right complex ovarian cyst.  Appears to have UTI.  Pelvic exam with moderate amount of yellow cervical discharge.  Wet prep with yeast, clue cells and white blood cells.  Given IM dose of ceftriaxone and single dose of azithromycin and Diflucan.  Will discharge home with a course of Keflex and Flagyl.  She understands all sexual partner to be evaluated and treated.  She will follow-up with the health department or at [facility] clinic.  Return precautions given. Final Clinical Impressions(s) / ED Diagnoses   Final diagnoses:  Cervicitis  Urinary tract infection without hematuria, site unspecified  Cyst of right ovary  Candida vaginitis    ED Discharge Orders        Ordered    cephALEXin (KEFLEX) 500 MG capsule  3 times daily     12/21/17 2242    metroNIDAZOLE (FLAGYL) 500 MG tablet  2 times daily     12/21/17 2242    ibuprofen (ADVIL,MOTRIN) 600 MG tablet  Every 6 hours PRN     12/21/17 2242       Loren Racer, MD 12/22/17 1646

## 2017-12-21 NOTE — ED Notes (Signed)
Patient made aware of need for urine sample.  States she just urinated.

## 2017-12-23 ENCOUNTER — Encounter (HOSPITAL_COMMUNITY): Payer: Self-pay | Admitting: Emergency Medicine

## 2017-12-23 LAB — GC/CHLAMYDIA PROBE AMP (~~LOC~~) NOT AT ARMC
CHLAMYDIA, DNA PROBE: NEGATIVE
Neisseria Gonorrhea: NEGATIVE

## 2017-12-24 LAB — URINE CULTURE: Culture: >=100000 — AB

## 2017-12-25 ENCOUNTER — Telehealth: Payer: Self-pay | Admitting: *Deleted

## 2017-12-25 NOTE — Telephone Encounter (Signed)
Post ED Visit - Positive Culture Follow-up  Culture report reviewed by antimicrobial stewardship pharmacist:  []  Chelsea Burgess, Pharm.D. []  Chelsea Burgess, Pharm.D., BCPS AQ-ID []  Chelsea Burgess, Pharm.D., BCPS []  Chelsea Burgess, Pharm.D., BCPS []  Chelsea Burgess, [address] BCPS, AAHIVP []  Chelsea Burgess, Pharm.D., BCPS, AAHIVP [x]  Chelsea Burgess, PharmD, BCPS []  Chelsea Burgess, PharmD, BCPS []  Chelsea Burgess, PharmD, BCPS []  Chelsea Burgess, PharmD  Positive urine culture Treated with Cephalexin, organism sensitive to the same and no further patient follow-up is required at this time.  Chelsea Burgess, [facility]ess [facility] 12/25/2017, 9:42 AM

## 2018-01-24 ENCOUNTER — Encounter (HOSPITAL_COMMUNITY): Payer: Self-pay | Admitting: *Deleted

## 2018-01-24 ENCOUNTER — Inpatient Hospital Stay (HOSPITAL_COMMUNITY)
Admission: AD | Admit: 2018-01-24 | Discharge: 2018-01-24 | Disposition: A | Discharge status: Home / Self Care | Payer: Medicaid Other | Source: Ambulatory Visit | Attending: Family Medicine | Admitting: Family Medicine

## 2018-01-24 DIAGNOSIS — Z6741 Type O blood, Rh negative: Secondary | ICD-10-CM | POA: Insufficient documentation

## 2018-01-24 DIAGNOSIS — O4690 Antepartum hemorrhage, unspecified, unspecified trimester: Secondary | ICD-10-CM

## 2018-01-24 DIAGNOSIS — O200 Threatened abortion: Secondary | ICD-10-CM | POA: Insufficient documentation

## 2018-01-24 DIAGNOSIS — Z3A01 Less than 8 weeks gestation of pregnancy: Secondary | ICD-10-CM

## 2018-01-24 DIAGNOSIS — Z6791 Unspecified blood type, Rh negative: Secondary | ICD-10-CM

## 2018-01-24 DIAGNOSIS — O4691 Antepartum hemorrhage, unspecified, first trimester: Secondary | ICD-10-CM

## 2018-01-24 HISTORY — DX: Other specified health status: Z78.9

## 2018-01-24 LAB — URINALYSIS, ROUTINE W REFLEX MICROSCOPIC
BILIRUBIN URINE: NEGATIVE
Glucose, UA: 50 mg/dL — AB
Ketones, ur: 20 mg/dL — AB
LEUKOCYTES UA: NEGATIVE
NITRITE: NEGATIVE
PH: 5 (ref 5.0–8.0)
PROTEIN: NEGATIVE mg/dL
Specific Gravity, Urine: 1.016 (ref 1.005–1.030)

## 2018-01-24 LAB — HCG, QUANTITATIVE, PREGNANCY: hCG, Beta Chain, Quant, S: 10 m[IU]/mL — ABNORMAL HIGH (ref <5)

## 2018-01-24 LAB — POCT PREGNANCY, URINE: Preg Test, Ur: NEGATIVE

## 2018-01-24 LAB — HCG, SERUM, QUALITATIVE: Preg, Serum: POSITIVE — AB

## 2018-01-24 MED ORDER — RHO D IMMUNE GLOBULIN 1500 UNIT/2ML IJ SOSY
300.0000 ug | PREFILLED_SYRINGE | Freq: Once | INTRAMUSCULAR | Status: AC
  Administered 2018-01-24: 300 ug via INTRAMUSCULAR
  Filled 2018-01-24: qty 2

## 2018-01-24 MED ORDER — IBUPROFEN 100 MG/5ML PO SUSP
600.0000 mg | Freq: Once | ORAL | Status: AC
  Administered 2018-01-24: 600 mg via ORAL
  Filled 2018-01-24: qty 30

## 2018-01-24 NOTE — Discharge Instructions (Signed)
Pelvic Rest Pelvic rest may be recommended if:  Your placenta is partially or completely covering the opening of your cervix (placenta previa).  There is bleeding between the wall of the uterus and the amniotic sac in the first trimester of pregnancy (subchorionic hemorrhage).  You went into labor too early (preterm labor).  Based on your overall health and the health of your baby, your health care provider will decide if pelvic rest is right for you. How do I rest my pelvis? For as long as told by your health care provider:  Do not have sex, sexual stimulation, or an orgasm.  Do not use tampons. Do not douche. Do not put anything in your vagina.  Do not lift anything that is heavier than 10 lb (4.5 kg).  Avoid activities that take a lot of effort (are strenuous).  Avoid any activity in which your pelvic muscles could become strained.  When should I seek medical care? Seek medical care if you have:  Cramping pain in your lower abdomen.  Vaginal discharge.  A low, dull backache.  Regular contractions.  Uterine tightening.  When should I seek immediate medical care? Seek immediate medical care if:  You have vaginal bleeding and you are pregnant.  This information is not intended to replace advice given to you by your health care provider. Make sure you discuss any questions you have with your health care provider. Document Released: 10/06/2010 Document Revised: 11/17/2015 Document Reviewed: 12/13/2014 Elsevier Interactive Patient Education  2018 [employer] Rh Incompatibility Rh incompatibility is a condition that occurs during pregnancy if a woman has Rh-negative blood and her baby has Rh-positive blood. "Rh-negative" and "Rh-positive" refer to whether or not the blood has an Rh factor. An Rh factor is a specific protein found on the surface of red blood cells. If a woman has Rh factor, she is Rh-positive. If she does not have an Rh factor, she is Rh-negative. Having or  not having an Rh factor does not affect the mothers general health. However, it can cause problems during pregnancy. What kind of problems can Rh incompatibility cause? During pregnancy, blood from the baby can cross into the mothers bloodstream, especially during delivery. If a mother is Rh-negative and the baby is Rh-positive, the mothers defense system will react to the baby's blood as if it was a foreign substance and will create proteins (antibodies). This is called sensitization. Once the mother is sensitized, her Rh antibodies will cross the placenta to the baby and attack the babys Rh-positive blood as if it is a harmful substance. Rh incompatibility can also happen if the Rh-negative pregnant woman is exposed to the Rh factor during a blood transfusion with Rh-positive blood. How does this condition affect my baby? The Rh antibodies that attack and destroy the babys red blood cells can lead to hemolytic disease in the baby. Hemolytic disease is when the red blood cells break down. This can cause:  Yellowing of the skin and eyes (jaundice).  The body to not have enough healthy red blood cells (anemia).  Brain damage.  Heart failure.  Death.  These antibodies usually do not cause problems during a first pregnancy. This is because the blood from the baby often times crosses into the mothers bloodstream during delivery, and the baby is born before many of the antibodies can develop. However, the antibodies stay in your body once they have formed. Because of this, Rh incompatibility is more likely to cause problems in second or later pregnancies (if  the baby is Rh-positive). How is this diagnosed? When a woman becomes pregnant, blood tests may be done to find out her blood type and Rh factor. If the woman is Rh-negative, she also may have another blood test called an antibody screen. The antibody screen shows whether she has Rh antibodies in her blood. If she does, it means she was  exposed to Rh-positive blood before, and she is at risk for Rh incompatibility. To find out whether the baby is developing hemolytic anemia and how serious it is, caregivers may use more advanced tests, such as ultrasonography (commonly known as ultrasound). How is Rh incompatibility treated? Rh incompatibility is treated with a shot of medicine called Rho (D) immune globulin. This medicine keeps the woman's body from making antibodies that can cause serious problems in the baby or future babies. Two shots will be given, one at around your seventh month of pregnancy and the other within 72 hours of your baby being born. If you are Rh-negative, you will need this medicine every time you have a baby with Rh-positive blood. If you already have antibodies in your blood, Rho (D) immune globulin will not help. Your doctor will not give you this medicine, but will watch your pregnancy closely for problems instead. This shot may also be given to an Rh-negative woman when the risk of blood transfer between the mom and baby is high. The risk is high with:  An amniocentesis.  A miscarriage or an abortion.  An ectopic pregnancy.  Any vaginal bleeding during pregnancy.  This information is not intended to replace advice given to you by your health care provider. Make sure you discuss any questions you have with your health care provider. Document Released: 12/01/2001 Document Revised: 11/17/2015 Document Reviewed: 09/23/2012 Elsevier Interactive Patient Education  2017 [employer]vier Inc.  Threatened Miscarriage A threatened miscarriage is when you have vaginal bleeding during your first 20 weeks of pregnancy but the pregnancy has not ended. Your doctor will do tests to make sure you are still pregnant. The cause of the bleeding may not be known. This condition does not mean your pregnancy will end. It does increase the risk of it ending (complete miscarriage). Follow these instructions at home:  Make sure you  keep all your doctor visits for prenatal care.  Get plenty of rest.  Do not have sex or use tampons if you have vaginal bleeding.  Do not douche.  Do not smoke or use drugs.  Do not drink alcohol.  Avoid caffeine. Contact a doctor if:  You have light bleeding from your vagina.  You have belly pain or cramping.  You have a fever. Get help right away if:  You have heavy bleeding from your vagina.  You have clots of blood coming from your vagina.  You have bad pain or cramps in your low back or belly.  You have fever, chills, and bad belly pain. This information is not intended to replace advice given to you by your health care provider. Make sure you discuss any questions you have with your health care provider. Document Released: 05/24/2008 Document Revised: 11/17/2015 Document Reviewed: 04/07/2013 Elsevier Interactive Patient Education  [employer].

## 2018-01-24 NOTE — MAU Note (Signed)
Positive upt yesterday. Spotting today and feels like going to start period with mild back pain.

## 2018-01-24 NOTE — MAU Provider Note (Signed)
S:   22 y.o. G1P0 @ 4778w5d by LMP presents to MAU for pregnancy confirmation.  She denies abdominal pain. She has been spotting as of yesterday. The spotting is similar to a menstrual cycle. Says she had 2 positive pregnancy tests at home yesterday.    O: BP (!) 116/52 (BP Location: Right Arm)   Pulse 63   Temp 98.3 F (36.8 C) (Oral)   Resp 18   Ht 5\' 7"  (1.702 m)   Wt 139 lb 0.1 oz (63.1 kg)   LMP 12/23/2017 (Approximate)   SpO2 98%   BMI 21.77 kg/m  Physical Examination: General appearance - alert, well appearing, and in no distress, oriented to person, place, and time and acyanotic, in no respiratory distress ABD: Soft, non tender  GU: Cervix closed, anterior. Small amount of dark red blood noted on exam glove.   Results for orders placed or performed during the hospital encounter of 01/24/18 (from the past 48 hour(s))  Urinalysis, Routine w reflex microscopic     Status: Abnormal   Collection Time: 01/24/18  5:01 PM  Result Value Ref Range   Color, Urine STRAW (A) YELLOW   APPearance CLEAR CLEAR   Specific Gravity, Urine 1.016 1.005 - 1.030   pH 5.0 5.0 - 8.0   Glucose, UA 50 (A) NEGATIVE mg/dL   Hgb urine dipstick MODERATE (A) NEGATIVE   Bilirubin Urine NEGATIVE NEGATIVE   Ketones, ur 20 (A) NEGATIVE mg/dL   Protein, ur NEGATIVE NEGATIVE mg/dL   Nitrite NEGATIVE NEGATIVE   Leukocytes, UA NEGATIVE NEGATIVE   RBC / HPF 0-5 0 - 5 RBC/hpf   WBC, UA 0-5 0 - 5 WBC/hpf   Bacteria, UA RARE (A) NONE SEEN   Squamous Epithelial / LPF 0-5 0 - 5    Comment: Performed at [facility], [address] [place], [place] [address]  Pregnancy, urine POC     Status: None   Collection Time: 01/24/18  5:08 PM  Result Value Ref Range   Preg Test, Ur NEGATIVE NEGATIVE    Comment:        THE SENSITIVITY OF THIS METHODOLOGY IS >24 mIU/mL   hCG, serum, qualitative     Status: Abnormal   Collection Time: 01/24/18  7:14 PM  Result Value Ref Range   Preg, Serum WEAK POSITIVE (A)  NEGATIVE    Comment: Weak positive results should be confirmed with second sample obtained after 48 hours.  For immediate confirmation, order quantitative hCG.        THE SENSITIVITY OF THIS METHODOLOGY IS >10 mIU/mL. Performed at [facility], [address] [place], [place] [address]   ABO/Rh     Status: None   Collection Time: 01/24/18  7:14 PM  Result Value Ref Range   ABO/RH(D)      [place] NEG Performed at [facility], [address] [place], [place] [address]   hCG, quantitative, pregnancy     Status: Abnormal   Collection Time: 01/24/18  7:14 PM  Result Value Ref Range   hCG, Beta Chain, Quant, S 10 (H) <5 mIU/mL    Comment:          GEST. AGE      CONC.  (mIU/mL)   <=1 WEEK        5 - 50     2 WEEKS       50 - 500     3 WEEKS       100 - 10,000  4 WEEKS     1,000 - 30,000     5 WEEKS     3,500 - 115,000   6-8 WEEKS     12,000 - 270,000    12 WEEKS     15,000 - 220,000        FEMALE AND NON-PREGNANT FEMALE:     LESS THAN 5 mIU/mL Performed at [facility], [address] [place], [place] [address]   Rh IG workup (includes ABO/Rh)     Status: None (Preliminary result)   Collection Time: 01/24/18  7:14 PM  Result Value Ref Range   Gestational Age(Wks) 4    ABO/RH(D) O NEG    Antibody Screen NEG    Unit Number [id number]    Blood Component Type RHIG    Unit division 00    Status of Unit ISSUED    Transfusion Status      OK TO TRANSFUSE Performed at [facility], [address] [place], [place] [address]    MDM: Ibuprofen given 600 mg PO O negative blood type Rhogam given  UA Quantitative blood draw   A:  1. Vaginal bleeding in pregnancy   2. Blood type, Rh negative   3. [redacted] weeks gestation of pregnancy   4. Threatened miscarriage     P: D/C home Pelvic rest Return to the [facility] on Monday at 0900 for repeat blood work.  Return to MAU sooner if symptoms worsen Bleeding precautions   Venia Carbon, NP 8:15 AM

## 2018-01-24 NOTE — Progress Notes (Signed)
Venia CarbonJennifer Rasch NP in earlier to discuss test results and d/c plan. Written and verbal d/c instructions given and understanding voiced. Understands to return Monday am at 0900 to clinic for repeat BHCG or sooner for concerns

## 2018-01-25 LAB — ABO/RH: ABO/RH(D): O NEG

## 2018-01-26 LAB — RH IG WORKUP (INCLUDES ABO/RH)
ABO/RH(D): O NEG
Antibody Screen: NEGATIVE
Gestational Age(Wks): 4
UNIT DIVISION: 0

## 2018-01-27 ENCOUNTER — Ambulatory Visit (INDEPENDENT_AMBULATORY_CARE_PROVIDER_SITE_OTHER): Payer: Self-pay | Admitting: General Practice

## 2018-01-27 DIAGNOSIS — O283 Abnormal ultrasonic finding on antenatal screening of mother: Secondary | ICD-10-CM

## 2018-01-27 DIAGNOSIS — O3680X0 Pregnancy with inconclusive fetal viability, not applicable or unspecified: Secondary | ICD-10-CM

## 2018-01-27 LAB — HCG, QUANTITATIVE, PREGNANCY: hCG, Beta Chain, Quant, S: 6 m[IU]/mL — ABNORMAL HIGH (ref <5)

## 2018-01-27 NOTE — Progress Notes (Signed)
Patient seen and assessed by nursing staff.  Agree with documentation and plan.  

## 2018-01-27 NOTE — Progress Notes (Signed)
Patient presents to office today for stat bhcg. Patient denies pain but reports continued spotting. Discussed with patient we are monitoring your bhcg levels today & asked she wait in lobby for results/updated plan of care. Patient verbalized understanding to all & had no questions at this time.  Reviewed results with Dr Shawnie PonsPratt who finds decreasing bhcg levels indicated SAB. Patient should return in a week for follow up bhcg.  Informed patient of results & recommended follow up. Patient verbalized understanding & had no questions.

## 2018-02-03 ENCOUNTER — Other Ambulatory Visit: Payer: Medicaid Other

## 2018-05-12 ENCOUNTER — Encounter (HOSPITAL_COMMUNITY): Payer: Self-pay | Admitting: Emergency Medicine

## 2018-05-12 ENCOUNTER — Inpatient Hospital Stay (HOSPITAL_COMMUNITY)
Admission: AD | Admit: 2018-05-12 | Discharge: 2018-05-12 | Disposition: A | Discharge status: Home / Self Care | Payer: Medicaid Other | Source: Ambulatory Visit | Attending: Obstetrics and Gynecology | Admitting: Obstetrics and Gynecology

## 2018-05-12 ENCOUNTER — Other Ambulatory Visit: Payer: Self-pay

## 2018-05-12 DIAGNOSIS — O26891 Other specified pregnancy related conditions, first trimester: Secondary | ICD-10-CM | POA: Insufficient documentation

## 2018-05-12 DIAGNOSIS — O210 Mild hyperemesis gravidarum: Secondary | ICD-10-CM | POA: Insufficient documentation

## 2018-05-12 DIAGNOSIS — R51 Headache: Secondary | ICD-10-CM | POA: Insufficient documentation

## 2018-05-12 DIAGNOSIS — Z3A11 11 weeks gestation of pregnancy: Secondary | ICD-10-CM | POA: Insufficient documentation

## 2018-05-12 LAB — URINALYSIS, ROUTINE W REFLEX MICROSCOPIC
BILIRUBIN URINE: NEGATIVE
Glucose, UA: NEGATIVE mg/dL
HGB URINE DIPSTICK: NEGATIVE
Ketones, ur: NEGATIVE mg/dL
NITRITE: NEGATIVE
PROTEIN: 30 mg/dL — AB
Specific Gravity, Urine: 1.02 (ref 1.005–1.030)
pH: 8 (ref 5.0–8.0)

## 2018-05-12 LAB — CBC
HCT: 40.6 % (ref 36.0–46.0)
HEMOGLOBIN: 13.6 g/dL (ref 12.0–15.0)
MCH: 29.2 pg (ref 26.0–34.0)
MCHC: 33.5 g/dL (ref 30.0–36.0)
MCV: 87.1 fL (ref 80.0–100.0)
Platelets: 216 10*3/uL (ref 150–400)
RBC: 4.66 MIL/uL (ref 3.87–5.11)
RDW: 13.2 % (ref 11.5–15.5)
WBC: 7.6 10*3/uL (ref 4.0–10.5)
nRBC: 0 % (ref 0.0–0.2)

## 2018-05-12 MED ORDER — ACETAMINOPHEN 500 MG PO TABS
1000.0000 mg | ORAL_TABLET | Freq: Four times a day (QID) | ORAL | Status: DC | PRN
  Administered 2018-05-12: 1000 mg via ORAL
  Filled 2018-05-12: qty 2

## 2018-05-12 MED ORDER — SCOPOLAMINE 1 MG/3DAYS TD PT72: 1.0000 | MEDICATED_PATCH | TRANSDERMAL | 0 refills | Status: DC

## 2018-05-12 MED ORDER — PROMETHAZINE HCL 25 MG PO TABS: 12.5000 mg | ORAL_TABLET | Freq: Four times a day (QID) | ORAL | 0 refills | Status: DC | PRN

## 2018-05-12 MED ORDER — DIPHENHYDRAMINE HCL 25 MG PO CAPS
25.0000 mg | ORAL_CAPSULE | Freq: Once | ORAL | Status: DC
  Filled 2018-05-12: qty 1

## 2018-05-12 MED ORDER — METOCLOPRAMIDE HCL 10 MG PO TABS
10.0000 mg | ORAL_TABLET | Freq: Once | ORAL | Status: DC
  Filled 2018-05-12: qty 1

## 2018-05-12 NOTE — MAU Note (Addendum)
Pt arrives to MAU with co a HA since last night that she rates a 6 on scale of 0-10.  Denies abd pain or bleeding. No burning w/urineation. Pt states that while she was on her way to work this morning her hands felt numb.   Pt also has co if feeling dizzy.   Pts states she did not take anything for her HA.  FHR 160 bpm

## 2018-05-12 NOTE — Discharge Instructions (Signed)

## 2018-05-12 NOTE — MAU Provider Note (Addendum)
History    CSN: [id number]  Arrival date and time: 05/12/18 1011   First Provider Initiated Contact with Patient 05/12/18 1039    Chief Complaint  Patient presents with  . Numbness  . Headache  . Dizziness   Suriya Kovarik is a 22 y/o G2P0 at [redacted]w[redacted]d with no significant PMHx who presented to MAU today with new onset diffuse headache with associated blurred vision that started this morning. She describes the HA as being diffuse, constant, worse with activity, and similar to "migraines" that she has had in the past. Has not tried taking any medications for symptomatic relief. She feels like lying down and closing her eyes would help her HA.   When pt was on her way to the MAU for evaluation, she began experiencing sudden onset bilateral finger numbness. She has never experienced this before and has no known history of CTS.   She has been experiencing intermittent N/V over the past several weeks that is worsened after drinking water, however, she has been able to maintain normal PO intake and has not noticed any decreased urine output.   Vision changes and bilateral finger numbness self resolved since admission to MAU.   Headache   This is a recurrent problem. The current episode started today. The problem occurs constantly. The problem has been unchanged. The pain is located in the bilateral region. The pain does not radiate. The pain quality is similar to prior headaches. The quality of the pain is described as aching and dull. Associated symptoms include nausea, numbness, photophobia and vomiting. Pertinent negatives include no abdominal pain, fever or neck pain. The symptoms are aggravated by activity and bright light. She has tried nothing for the symptoms.  Dizziness  Associated symptoms include fatigue, headaches, nausea, numbness and vomiting. Pertinent negatives include no abdominal pain, chills, diaphoresis, fever or neck pain.     Past Medical History:  Diagnosis Date  .  Medical history non-contributory     Past Surgical History:  Procedure Laterality Date  . NO PAST SURGERIES      History reviewed. No pertinent family history.  Social History   Tobacco Use  . Smoking status: Never Smoker  . Smokeless tobacco: Never Used  Substance Use Topics  . Alcohol use: Not Currently  . Drug use: Never    Allergies: No Known Allergies  Medications Prior to Admission  Medication Sig Dispense Refill Last Dose  . lidocaine (XYLOCAINE) 5 % ointment Apply 1 application topically as needed. For painful lesion 35.44 g 0 More than a month at Unknown time  . promethazine (PHENERGAN) 25 MG tablet Take 1 tablet (25 mg total) by mouth every 6 (six) hours as needed for nausea or vomiting. 30 tablet 0 More than a month at Unknown time  . valACYclovir (VALTREX) 500 MG tablet Take 1 tablet (500 mg total) by mouth 2 (two) times daily. 6 tablet 0 More than a month at Unknown time    Review of Systems  Constitutional: Positive for fatigue. Negative for appetite change, chills, diaphoresis and fever.  Eyes: Positive for photophobia and visual disturbance.  Gastrointestinal: Positive for nausea and vomiting. Negative for abdominal pain and rectal pain.  Endocrine: Negative for polyuria.  Genitourinary: Negative for decreased urine volume, difficulty urinating, dysuria, frequency, hematuria and urgency.  Musculoskeletal: Negative for neck pain.  Neurological: Positive for numbness and headaches.   Physical Exam   Blood pressure (!) 117/96, pulse 90, temperature 98.4 F (36.9 C), temperature source Oral, resp. rate 17,  height 5\' 7"  (1.702 m), weight 60.8 kg, last menstrual period 02/19/2018, SpO2 100 %, unknown if currently breastfeeding.  Physical Exam  Constitutional: She is oriented to person, place, and time. Vital signs are normal. No distress.  Eyes: Pupils are equal, round, and reactive to light. EOM are normal.  Neck: Normal range of motion. Neck supple.   Cardiovascular: Normal rate, regular rhythm and normal heart sounds.  No murmur heard. Respiratory: Effort normal and breath sounds normal.  GI: Soft. She exhibits no distension. There is no tenderness. There is no rebound and no guarding.  Neurological: She is alert and oriented to person, place, and time. No cranial nerve deficit. Coordination normal.  Skin: She is not diaphoretic.    MAU Course  Procedures  MDM Labs ordered and reviewed. Neuro exam intact. Planned to provide pt with HA cocktail for symptomatic relief but unable to provide 2/2 pt needs to drive and does not have other transportation. Prescribing anti-emetic. Orthostatic BP readings WNL. UA indicates many bacteria. Pt not experiencing any urinary symptoms. Reflex urine culture pending. HA resolved after Tylenol. CBC indicates Hgb 13.6. Stable for d/c home.  Assessment and Plan  Drema DallasKourtney Luera is a 22 y/o G2P0 at 2056w5d who presented to the MAU with new onset constant diffuse headache with associated vision changes that began earlier in the morning prior to presentation c/f migraine vs pregnancy HA vs dehydration vs symptomatic anemia.   Pregnancy Headache in first trimester: New onset headache that worsens with activity and improved after administration of Tylenol while in MAU c/w pregnancy headache during first trimester.   -- Tylenol PRN for symptomatic relief  Morning sickness: Several week history of intermittent N/V that is worsened with PO intake c/w morning sickness.  -- Promethazine 25 mg tablet q6hrs PRN and scopolamine PRN  Pregnancy care: -- Return cautions discussed with pt -- Continue to f[facility]   Hal NeerFrances G Marva Hendryx 05/12/2018, 11:23 AM

## 2018-05-12 NOTE — MAU Provider Note (Signed)
History     CSN: [id number]  Arrival date and time: 05/12/18 1011   First Provider Initiated Contact with Patient 05/12/18 1039      Chief Complaint  Patient presents with  . Numbness  . Headache  . Dizziness   G2P0 @11 .5 wks her with HA. Sx started this am. Associated sx are blurred vision and bilateral finger numbness. She did not take anything but lying down and closing eyes helped. Since her arrival the vision changes and numbness have resolved. Reports similar sx early in pregnancy but was worse then. Endorses intermittent N/V over last several weeks.    OB History    Gravida  2   Para      Term      Preterm      AB      Living  0     SAB      TAB      Ectopic      Multiple      Live Births              Past Medical History:  Diagnosis Date  . Medical history non-contributory     Past Surgical History:  Procedure Laterality Date  . NO PAST SURGERIES      History reviewed. No pertinent family history.  Social History   Tobacco Use  . Smoking status: Never Smoker  . Smokeless tobacco: Never Used  Substance Use Topics  . Alcohol use: Not Currently  . Drug use: Never    Allergies: No Known Allergies  No medications prior to admission.    Review of Systems  Eyes: Positive for visual disturbance.  Gastrointestinal: Positive for nausea and vomiting. Negative for abdominal pain.  Genitourinary: Negative for dysuria, frequency, urgency and vaginal bleeding.  Neurological: Positive for dizziness and headaches. Negative for syncope.   Physical Exam   Blood pressure 113/69, pulse 73, temperature 98.6 F (37 C), temperature source Oral, resp. rate 17, height 5\' 7"  (1.702 m), weight 60.8 kg, last menstrual period 02/19/2018, SpO2 100 %, unknown if currently breastfeeding.  Physical Exam  Constitutional: She is oriented to person, place, and time. She appears well-developed and well-nourished. No distress.  HENT:  Head: Normocephalic and  atraumatic.  Neck: Normal range of motion.  Cardiovascular: Normal rate.  Respiratory: Effort normal. No respiratory distress.  Musculoskeletal: Normal range of motion.  Neurological: She is alert and oriented to person, place, and time. No cranial nerve deficit.  Skin: Skin is warm and dry.  Psychiatric: She has a normal mood and affect.  FHT 160  Results for orders placed or performed during the hospital encounter of 05/12/18 (from the past 24 hour(s))  Urinalysis, Routine w reflex microscopic     Status: Abnormal   Collection Time: 05/12/18 10:37 AM  Result Value Ref Range   Color, Urine YELLOW YELLOW   APPearance HAZY (A) CLEAR   Specific Gravity, Urine 1.020 1.005 - 1.030   pH 8.0 5.0 - 8.0   Glucose, UA NEGATIVE NEGATIVE mg/dL   Hgb urine dipstick NEGATIVE NEGATIVE   Bilirubin Urine NEGATIVE NEGATIVE   Ketones, ur NEGATIVE NEGATIVE mg/dL   Protein, ur 30 (A) NEGATIVE mg/dL   Nitrite NEGATIVE NEGATIVE   Leukocytes, UA TRACE (A) NEGATIVE   RBC / HPF 0-5 0 - 5 RBC/hpf   WBC, UA 6-10 0 - 5 WBC/hpf   Bacteria, UA MANY (A) NONE SEEN   Squamous Epithelial / LPF 0-5 0 - 5   Mucus PRESENT  CBC     Status: None   Collection Time: 05/12/18 11:18 AM  Result Value Ref Range   WBC 7.6 4.0 - 10.5 K/uL   RBC 4.66 3.87 - 5.11 MIL/uL   Hemoglobin 13.6 12.0 - 15.0 g/dL   HCT 16.140.6 09.636.0 - 04.546.0 %   MCV 87.1 80.0 - 100.0 fL   MCH 29.2 26.0 - 34.0 pg   MCHC 33.5 30.0 - 36.0 g/dL   RDW 40.913.2 81.111.5 - 91.415.5 %   Platelets 216 150 - 400 K/uL   nRBC 0.0 0.0 - 0.2 %   MAU Course  Procedures Tylenol  MDM Labs ordered and reviewed. Unable to give HA cocktail d/t pt not having transportation home. Will provide Rx for antiemetic. Many bacteria on UA, pt asymptomatic, will send UC. HA resolved after Tylenol. Normal neuro exam, no thunderclap. Stable for discharge home.   Assessment and Plan   1. [redacted] weeks gestation of pregnancy   2. Pregnancy headache in first trimester   3. Morning sickness     Discharge home [facility] as [facility] Rx Phenergan Rx Scopolamine Tylenol prn Return precautions  Allergies as of 05/12/2018   No Known Allergies     Medication List    STOP taking these medications   lidocaine 5 % ointment Commonly known as:  XYLOCAINE   valACYclovir 500 MG tablet Commonly known as:  VALTREX     TAKE these medications   promethazine 25 MG tablet Commonly known as:  PHENERGAN Take 0.5-1 tablets (12.5-25 mg total) by mouth every 6 (six) hours as needed for nausea or vomiting. What changed:  how much to take   scopolamine 1 MG/3DAYS Commonly known as:  TRANSDERM-SCOP Place 1 patch (1.5 mg total) onto the skin every 3 (three) days.       Chelsea Burgess, CNM 05/12/2018, 1:09 PM

## 2018-05-14 LAB — CULTURE, OB URINE

## 2018-05-15 ENCOUNTER — Telehealth: Payer: Self-pay | Admitting: Advanced Practice Midwife

## 2018-05-15 MED ORDER — CEPHALEXIN 500 MG PO CAPS: 500.0000 mg | ORAL_CAPSULE | Freq: Four times a day (QID) | ORAL | 0 refills | Status: DC

## 2018-05-15 NOTE — Telephone Encounter (Signed)
+  UTI. Rx for Kelfex. Increase water intake. Finish all abx.

## 2018-06-19 ENCOUNTER — Ambulatory Visit (INDEPENDENT_AMBULATORY_CARE_PROVIDER_SITE_OTHER): Payer: BLUE CROSS/BLUE SHIELD | Admitting: Women's Health

## 2018-06-19 ENCOUNTER — Other Ambulatory Visit (HOSPITAL_COMMUNITY)
Admission: RE | Admit: 2018-06-19 | Discharge: 2018-06-19 | Disposition: A | Discharge status: Home / Self Care | Payer: Medicaid Other | Source: Ambulatory Visit | Attending: Obstetrics & Gynecology | Admitting: Obstetrics & Gynecology

## 2018-06-19 ENCOUNTER — Ambulatory Visit: Payer: BLUE CROSS/BLUE SHIELD | Admitting: *Deleted

## 2018-06-19 ENCOUNTER — Encounter: Payer: Self-pay | Admitting: Women's Health

## 2018-06-19 ENCOUNTER — Other Ambulatory Visit: Payer: Self-pay

## 2018-06-19 VITALS — BP 126/79 | HR 71 | Wt 132.0 lb

## 2018-06-19 DIAGNOSIS — B9689 Other specified bacterial agents as the cause of diseases classified elsewhere: Secondary | ICD-10-CM

## 2018-06-19 DIAGNOSIS — Z23 Encounter for immunization: Secondary | ICD-10-CM | POA: Diagnosis not present

## 2018-06-19 DIAGNOSIS — Z1151 Encounter for screening for human papillomavirus (HPV): Secondary | ICD-10-CM | POA: Insufficient documentation

## 2018-06-19 DIAGNOSIS — N898 Other specified noninflammatory disorders of vagina: Secondary | ICD-10-CM

## 2018-06-19 DIAGNOSIS — Z363 Encounter for antenatal screening for malformations: Secondary | ICD-10-CM

## 2018-06-19 DIAGNOSIS — N760 Acute vaginitis: Secondary | ICD-10-CM

## 2018-06-19 DIAGNOSIS — Z124 Encounter for screening for malignant neoplasm of cervix: Secondary | ICD-10-CM | POA: Insufficient documentation

## 2018-06-19 DIAGNOSIS — O26892 Other specified pregnancy related conditions, second trimester: Secondary | ICD-10-CM

## 2018-06-19 DIAGNOSIS — Z331 Pregnant state, incidental: Secondary | ICD-10-CM

## 2018-06-19 DIAGNOSIS — Z3A17 17 weeks gestation of pregnancy: Secondary | ICD-10-CM

## 2018-06-19 DIAGNOSIS — R87610 Atypical squamous cells of undetermined significance on cytologic smear of cervix (ASC-US): Secondary | ICD-10-CM | POA: Diagnosis not present

## 2018-06-19 DIAGNOSIS — Z1389 Encounter for screening for other disorder: Secondary | ICD-10-CM

## 2018-06-19 DIAGNOSIS — Z3482 Encounter for supervision of other normal pregnancy, second trimester: Secondary | ICD-10-CM

## 2018-06-19 DIAGNOSIS — Z3490 Encounter for supervision of normal pregnancy, unspecified, unspecified trimester: Secondary | ICD-10-CM | POA: Insufficient documentation

## 2018-06-19 DIAGNOSIS — Z1379 Encounter for other screening for genetic and chromosomal anomalies: Secondary | ICD-10-CM

## 2018-06-19 LAB — POCT URINALYSIS DIPSTICK OB
Blood, UA: NEGATIVE
GLUCOSE, UA: NEGATIVE
Ketones, UA: NEGATIVE
LEUKOCYTES UA: NEGATIVE
Nitrite, UA: NEGATIVE
PROTEIN: NEGATIVE

## 2018-06-19 LAB — POCT WET PREP (WET MOUNT)
Clue Cells Wet Prep Whiff POC: POSITIVE
Trichomonas Wet Prep HPF POC: ABSENT

## 2018-06-19 MED ORDER — METRONIDAZOLE 500 MG PO TABS: 500.0000 mg | ORAL_TABLET | Freq: Two times a day (BID) | ORAL | 0 refills | Status: DC

## 2018-06-19 NOTE — Progress Notes (Signed)
INITIAL OBSTETRICAL VISIT Patient name: Chelsea Burgess MRN [id number]  Date of birth: Oct 02, 1995 Chief Complaint:   Initial Prenatal Visit (transfer @ 17wk)  History of Present Illness:   Chelsea Burgess is a 22 y.o. 162P0010 African American female at 2574w1d by LMP c/w 7wk u/s, with an Estimated Date of Delivery: 11/26/18 being seen today for her initial obstetrical visit with us, [facility]   Her obstetrical history is significant for SAB x 1.   Today she reports malodorous d/c, no itching/irritation.  Patient's last menstrual period was 02/19/2018 (approximate). Last pap never. Results were: n/a Review of Systems:   Pertinent items are noted in HPI Denies cramping/contractions, leakage of fluid, vaginal bleeding, abnormal vaginal discharge w/ itching/odor/irritation, headaches, visual changes, shortness of breath, chest pain, abdominal pain, severe nausea/vomiting, or problems with urination or bowel movements unless otherwise stated above.  Pertinent History Reviewed:  Reviewed past medical,surgical, social, obstetrical and family history.  Reviewed problem list, medications and allergies. OB History  Gravida Para Term Preterm AB Living  2       1 0  SAB TAB Ectopic Multiple Live Births  1            # Outcome Date GA Lbr Len/2nd Weight Sex Delivery Anes PTL Lv  2 Current           1 SAB 2019           Physical Assessment:   Vitals:   06/19/18 1421  BP: 126/79  Pulse: 71  Weight: 132 lb (59.9 kg)  Body mass index is 20.67 kg/m.       Physical Examination:  General appearance - well appearing, and in no distress  Mental status - alert, oriented to person, place, and time  Psych:  She has a normal mood and affect  Skin - warm and dry, normal color, no suspicious lesions noted  Chest - effort normal, all lung fields clear to auscultation bilaterally  Heart - normal rate and regular rhythm  Abdomen - soft, nontender  Extremities:  No  swelling or varicosities noted  Pelvic - VULVA: normal appearing vulva with no masses, tenderness or lesions  VAGINA: normal appearing vagina with normal color and discharge, no lesions  CERVIX: normal appearing cervix without discharge or lesions, no CMT  Thin prep pap is done w/ reflex HR HPV cotesting  Fetal Heart Rate (bpm): 150 via doppler  Results for orders placed or performed in visit on 06/19/18 (from the past 24 hour(s))  POC Urinalysis Dipstick OB   Collection Time: 06/19/18  2:35 PM  Result Value Ref Range   Color, UA     Clarity, UA     Glucose, UA Negative Negative   Bilirubin, UA     Ketones, UA neg    Spec Grav, UA     Blood, UA neg    pH, UA     POC,PROTEIN,UA Negative Negative, Trace, Small (1+), Moderate (2+), Large (3+), 4+   Urobilinogen, UA     Nitrite, UA neg    Leukocytes, UA Negative Negative   Appearance     Odor    POCT Wet Prep [facility]([place] [place])   Collection Time: 06/19/18  2:57 PM  Result Value Ref Range   Source Wet Prep POC vaginal    WBC, Wet Prep HPF POC few    Bacteria Wet Prep HPF POC Few Few   BACTERIA WET PREP MORPHOLOGY POC     Clue  Cells Wet Prep HPF POC Many (A) None   Clue Cells Wet Prep Whiff POC Positive Whiff    Yeast Wet Prep HPF POC None None   KOH Wet Prep POC     Trichomonas Wet Prep HPF POC Absent Absent    Assessment & Plan:  1) Low-Risk Pregnancy G2P0010 at 7378w1d with an Estimated Date of Delivery: 11/26/18   2) Initial OB visit  3) BV> Rx metronidazole 500mg  BID x 7d for BV, no sex while taking   Meds:  Meds ordered this encounter  Medications  . metroNIDAZOLE (FLAGYL) 500 MG tablet    Sig: Take 1 tablet (500 mg total) by mouth 2 (two) times daily.    Dispense:  14 tablet    Refill:  0    Order Specific Question:   Supervising Provider    Answer:   Duane LopeEURE, LUTHER H [[id number]]    Initial labs obtained Continue prenatal vitamins Reviewed n/v relief measures and warning s/s to report Reviewed recommended weight gain based on  pre-gravid BMI Encouraged well-balanced diet Genetic Screening discussed Quad Screen: requested Cystic fibrosis screening discussed declined Ultrasound discussed; fetal survey: requested CCNC completed>[place] Flu shot today  Follow-up: Return in about 2 weeks (around 07/03/2018) for LROB, ZO:XWRUEAVS:Anatomy.   Orders Placed This Encounter  Procedures  . Urine Culture  . US OB Comp + 14 Wk  . Flu Vaccine QUAD 36+ mos IM  . Obstetric Panel, Including HIV  . Urinalysis, Routine w reflex microscopic  . Sickle cell screen  . Pain Management Screening Profile (10S)  . AFP TETRA  . POC Urinalysis Dipstick OB  . POCT Wet Prep [facility]([place] [place])    Cheral MarkerKimberly R Gaige Fussner CNM, [facility]NP-BC 06/19/2018 3:00 PM

## 2018-06-19 NOTE — Patient Instructions (Signed)
Chelsea Burgess, I greatly value your feedback.  If you receive a survey following your visit with Chelsea Burgess today, we appreciate you taking the time to fill it out.  Thanks, Chelsea Burgess, CNM, WHNP-BC   Second Trimester of Pregnancy The second trimester is from week 14 through week 27 (months 4 through 6). The second trimester is often a time when you feel your best. Your body has adjusted to being pregnant, and you begin to feel better physically. Usually, morning sickness has lessened or quit completely, you may have more energy, and you may have an increase in appetite. The second trimester is also a time when the fetus is growing rapidly. At the end of the sixth month, the fetus is about 9 inches long and weighs about 1 pounds. You will likely begin to feel the baby move (quickening) between 16 and 20 weeks of pregnancy. Body changes during your second trimester Your body continues to go through many changes during your second trimester. The changes vary from woman to woman.  Your weight will continue to increase. You will notice your lower abdomen bulging out.  You may begin to get stretch marks on your hips, abdomen, and breasts.  You may develop headaches that can be relieved by medicines. The medicines should be approved by your health care provider.  You may urinate more often because the fetus is pressing on your bladder.  You may develop or continue to have heartburn as a result of your pregnancy.  You may develop constipation because certain hormones are causing the muscles that push waste through your intestines to slow down.  You may develop hemorrhoids or swollen, bulging veins (varicose veins).  You may have back pain. This is caused by: ? Weight gain. ? Pregnancy hormones that are relaxing the joints in your pelvis. ? A shift in weight and the muscles that support your balance.  Your breasts will continue to grow and they will continue to become tender.  Your gums may bleed  and may be sensitive to brushing and flossing.  Dark spots or blotches (chloasma, mask of pregnancy) may develop on your face. This will likely fade after the baby is born.  A dark line from your belly button to the pubic area (linea nigra) may appear. This will likely fade after the baby is born.  You may have changes in your hair. These can include thickening of your hair, rapid growth, and changes in texture. Some women also have hair loss during or after pregnancy, or hair that feels dry or thin. Your hair will most likely return to normal after your baby is born.  What to expect at prenatal visits During a routine prenatal visit:  You will be weighed to make sure you and the fetus are growing normally.  Your blood pressure will be taken.  Your abdomen will be measured to track your baby's growth.  The fetal heartbeat will be listened to.  Any test results from the previous visit will be discussed.  Your health care provider may ask you:  How you are feeling.  If you are feeling the baby move.  If you have had any abnormal symptoms, such as leaking fluid, bleeding, severe headaches, or abdominal cramping.  If you are using any tobacco products, including cigarettes, chewing tobacco, and electronic cigarettes.  If you have any questions.  Other tests that may be performed during your second trimester include:  Blood tests that check for: ? Low iron levels (anemia). ? High  blood sugar that affects pregnant women (gestational diabetes) between 3 and 28 weeks. ? Rh antibodies. This is to check for a protein on red blood cells (Rh factor).  Urine tests to check for infections, diabetes, or protein in the urine.  An ultrasound to confirm the proper growth and development of the baby.  An amniocentesis to check for possible genetic problems.  Fetal screens for spina bifida and Down syndrome.  HIV (human immunodeficiency virus) testing. Routine prenatal testing includes  screening for HIV, unless you choose not to have this test.  Follow these instructions at home: Medicines  Follow your health care provider's instructions regarding medicine use. Specific medicines may be either safe or unsafe to take during pregnancy.  Take a prenatal vitamin that contains at least 600 micrograms (mcg) of folic acid.  If you develop constipation, try taking a stool softener if your health care provider approves. Eating and drinking  Eat a balanced diet that includes fresh fruits and vegetables, whole grains, good sources of protein such as meat, eggs, or tofu, and low-fat dairy. Your health care provider will help you determine the amount of weight gain that is right for you.  Avoid raw meat and uncooked cheese. These carry germs that can cause birth defects in the baby.  If you have low calcium intake from food, talk to your health care provider about whether you should take a daily calcium supplement.  Limit foods that are high in fat and processed sugars, such as fried and sweet foods.  To prevent constipation: ? Drink enough fluid to keep your urine clear or pale yellow. ? Eat foods that are high in fiber, such as fresh fruits and vegetables, whole grains, and beans. Activity  Exercise only as directed by your health care provider. Most women can continue their usual exercise routine during pregnancy. Try to exercise for 30 minutes at least 5 days a week. Stop exercising if you experience uterine contractions.  Avoid heavy lifting, wear low heel shoes, and practice good posture.  A sexual relationship may be continued unless your health care provider directs you otherwise. Relieving pain and discomfort  Wear a good support bra to prevent discomfort from breast tenderness.  Take warm sitz baths to soothe any pain or discomfort caused by hemorrhoids. Use hemorrhoid cream if your health care provider approves.  Rest with your legs elevated if you have leg cramps  or low back pain.  If you develop varicose veins, wear support hose. Elevate your feet for 15 minutes, 3-4 times a day. Limit salt in your diet. Prenatal Care  Write down your questions. Take them to your prenatal visits.  Keep all your prenatal visits as told by your health care provider. This is important. Safety  Wear your seat belt at all times when driving.  Make a list of emergency phone numbers, including numbers for family, friends, the hospital, and police and fire departments. General instructions  Ask your health care provider for a referral to a local prenatal education class. Begin classes no later than the beginning of month 6 of your pregnancy.  Ask for help if you have counseling or nutritional needs during pregnancy. Your health care provider can offer advice or refer you to specialists for help with various needs.  Do not use hot tubs, steam rooms, or saunas.  Do not douche or use tampons or scented sanitary pads.  Do not cross your legs for long periods of time.  Avoid cat litter boxes and soil  used by cats. These carry germs that can cause birth defects in the baby and possibly loss of the fetus by miscarriage or stillbirth.  Avoid all smoking, herbs, alcohol, and unprescribed drugs. Chemicals in these products can affect the formation and growth of the baby.  Do not use any products that contain nicotine or tobacco, such as cigarettes and e-cigarettes. If you need help quitting, ask your health care provider.  Visit your dentist if you have not gone yet during your pregnancy. Use a soft toothbrush to brush your teeth and be gentle when you floss. Contact a health care provider if:  You have dizziness.  You have mild pelvic cramps, pelvic pressure, or nagging pain in the abdominal area.  You have persistent nausea, vomiting, or diarrhea.  You have a bad smelling vaginal discharge.  You have pain when you urinate. Get help right away if:  You have a  fever.  You are leaking fluid from your vagina.  You have spotting or bleeding from your vagina.  You have severe abdominal cramping or pain.  You have rapid weight gain or weight loss.  You have shortness of breath with chest pain.  You notice sudden or extreme swelling of your face, hands, ankles, feet, or legs.  You have not felt your baby move in over an hour.  You have severe headaches that do not go away when you take medicine.  You have vision changes. Summary  The second trimester is from week 14 through week 27 (months 4 through 6). It is also a time when the fetus is growing rapidly.  Your body goes through many changes during pregnancy. The changes vary from woman to woman.  Avoid all smoking, herbs, alcohol, and unprescribed drugs. These chemicals affect the formation and growth your baby.  Do not use any tobacco products, such as cigarettes, chewing tobacco, and e-cigarettes. If you need help quitting, ask your health care provider.  Contact your health care provider if you have any questions. Keep all prenatal visits as told by your health care provider. This is important. This information is not intended to replace advice given to you by your health care provider. Make sure you discuss any questions you have with your health care provider. Document Released: 06/05/2001 Document Revised: 11/17/2015 Document Reviewed: 08/12/2012 Elsevier Interactive Patient Education  2017 [employer].

## 2018-06-20 ENCOUNTER — Encounter: Payer: Self-pay | Admitting: Women's Health

## 2018-06-20 DIAGNOSIS — O26899 Other specified pregnancy related conditions, unspecified trimester: Secondary | ICD-10-CM

## 2018-06-20 DIAGNOSIS — Z6791 Unspecified blood type, Rh negative: Secondary | ICD-10-CM | POA: Insufficient documentation

## 2018-06-20 LAB — OBSTETRIC PANEL, INCLUDING HIV
ANTIBODY SCREEN: NEGATIVE
BASOS: 0 %
Basophils Absolute: 0 10*3/uL (ref 0.0–0.2)
EOS (ABSOLUTE): 0.1 10*3/uL (ref 0.0–0.4)
EOS: 2 %
HEMATOCRIT: 37.8 % (ref 34.0–46.6)
HEMOGLOBIN: 13 g/dL (ref 11.1–15.9)
HEP B S AG: NEGATIVE
HIV SCREEN 4TH GENERATION: NONREACTIVE
Immature Grans (Abs): 0 10*3/uL (ref 0.0–0.1)
Immature Granulocytes: 1 %
Lymphocytes Absolute: 1.5 10*3/uL (ref 0.7–3.1)
Lymphs: 18 %
MCH: 29.4 pg (ref 26.6–33.0)
MCHC: 34.4 g/dL (ref 31.5–35.7)
MCV: 86 fL (ref 79–97)
Monocytes Absolute: 0.7 10*3/uL (ref 0.1–0.9)
Monocytes: 8 %
NEUTROS ABS: 6 10*3/uL (ref 1.4–7.0)
Neutrophils: 71 %
Platelets: 217 10*3/uL (ref 150–450)
RBC: 4.42 x10E6/uL (ref 3.77–5.28)
RDW: 13.1 % (ref 12.3–15.4)
RH TYPE: NEGATIVE
RPR: NONREACTIVE
Rubella Antibodies, IGG: 11.3 index (ref >0.99)
WBC: 8.4 10*3/uL (ref 3.4–10.8)

## 2018-06-20 LAB — PMP SCREEN PROFILE (10S), URINE
AMPHETAMINE SCREEN URINE: NEGATIVE ng/mL
BARBITURATE SCREEN URINE: NEGATIVE ng/mL
BENZODIAZEPINE SCREEN, URINE: NEGATIVE ng/mL
CANNABINOIDS UR QL SCN: NEGATIVE ng/mL
CREATININE(CRT), U: 142 mg/dL (ref 20.0–300.0)
Cocaine (Metab) Scrn, Ur: NEGATIVE ng/mL
METHADONE SCREEN, URINE: NEGATIVE ng/mL
OPIATE SCREEN URINE: NEGATIVE ng/mL
OXYCODONE+OXYMORPHONE UR QL SCN: NEGATIVE ng/mL
PHENCYCLIDINE QUANTITATIVE URINE: NEGATIVE ng/mL
PROPOXYPHENE SCREEN URINE: NEGATIVE ng/mL
Ph of Urine: 7.9 (ref 4.5–8.9)

## 2018-06-20 LAB — URINALYSIS, ROUTINE W REFLEX MICROSCOPIC
Bilirubin, UA: NEGATIVE
Glucose, UA: NEGATIVE
Ketones, UA: NEGATIVE
LEUKOCYTES UA: NEGATIVE
Nitrite, UA: NEGATIVE
PH UA: 7.5 (ref 5.0–7.5)
Protein, UA: NEGATIVE
RBC UA: NEGATIVE
Specific Gravity, UA: 1.021 (ref 1.005–1.030)
Urobilinogen, Ur: 1 mg/dL (ref 0.2–1.0)

## 2018-06-20 LAB — SICKLE CELL SCREEN: Sickle Cell Screen: NEGATIVE

## 2018-06-21 LAB — AFP TETRA
DIA Mom Value: 1.07
DIA Value (EIA): 190.27 pg/mL
DSR (BY AGE) 1 IN: 1094
DSR (Second Trimester) 1 IN: 5421
Gestational Age: 17.1 WEEKS
MSAFP MOM: 1.29
MSAFP: 59.3 ng/mL
MSHCG MOM: 1.13
MSHCG: 40438 m[IU]/mL
Maternal Age At EDD: 23.3 yr
Osb Risk: 9894
TEST RESULTS AFP: NEGATIVE
WEIGHT: 132 [lb_av]
uE3 Mom: 0.84
uE3 Value: 0.98 ng/mL

## 2018-06-21 LAB — URINE CULTURE: Organism ID, Bacteria: NO GROWTH

## 2018-06-25 NOTE — L&D Delivery Note (Signed)
LABOR COURSE Patient was admitted for SROM last night 11/28/18 at 2100. She progressed without augmentation. Total ruptured time 18 hours. Patient remained afebrile and without fetal tachycardia throughout labor course.  Delivery Note Called to room and patient was complete with urge to push. Head delivered ROT. Loose nuchal cord present x 1, resolved with somersault . Shoulder and body delivered in usual fashion. At 1447 a viable female was delivered via Vaginal, Spontaneous (Presentation: ROT; ROA).  Infant with spontaneous cry, placed on mother's abdomen, dried and stimulated. Cord clamped x 2 after two-minute delay, and cut by FOB. Cord blood drawn. Placenta delivered spontaneously with gentle cord traction. Appears intact. Fundus firm with massage and Pitocin. Labia, perineum, vagina, and cervix inspected with no lacerations noted.    APGAR: 8,9; weight [id number]  .   Cord: 3VC with the following complications: loose nuchal x 1.   Cord pH: not collected  Anesthesia:Epidural Episiotomy: None Lacerations: None Est. Blood Loss (mL): 50  Mom to postpartum.  Baby to Couplet care / Skin to Skin.  Mallie Snooks, CNM 11/29/18  5:25 PM

## 2018-06-27 ENCOUNTER — Encounter: Payer: Self-pay | Admitting: Women's Health

## 2018-06-27 DIAGNOSIS — R87619 Unspecified abnormal cytological findings in specimens from cervix uteri: Secondary | ICD-10-CM | POA: Insufficient documentation

## 2018-06-27 LAB — CYTOLOGY - PAP
Chlamydia: NEGATIVE
DIAGNOSIS: UNDETERMINED — AB
HPV: DETECTED — AB
NEISSERIA GONORRHEA: NEGATIVE

## 2018-07-01 ENCOUNTER — Telehealth: Payer: Self-pay | Admitting: *Deleted

## 2018-07-03 ENCOUNTER — Encounter: Payer: Self-pay | Admitting: Obstetrics & Gynecology

## 2018-07-03 ENCOUNTER — Ambulatory Visit: Payer: Self-pay | Admitting: *Deleted

## 2018-07-03 ENCOUNTER — Encounter: Payer: Self-pay | Admitting: Women's Health

## 2018-07-03 ENCOUNTER — Ambulatory Visit (INDEPENDENT_AMBULATORY_CARE_PROVIDER_SITE_OTHER): Payer: BLUE CROSS/BLUE SHIELD | Admitting: Obstetrics & Gynecology

## 2018-07-03 ENCOUNTER — Ambulatory Visit (INDEPENDENT_AMBULATORY_CARE_PROVIDER_SITE_OTHER): Payer: BLUE CROSS/BLUE SHIELD

## 2018-07-03 VITALS — BP 129/89 | HR 73 | Wt 132.0 lb

## 2018-07-03 DIAGNOSIS — R87810 Cervical high risk human papillomavirus (HPV) DNA test positive: Secondary | ICD-10-CM | POA: Diagnosis not present

## 2018-07-03 DIAGNOSIS — Z3A19 19 weeks gestation of pregnancy: Secondary | ICD-10-CM

## 2018-07-03 DIAGNOSIS — Z1389 Encounter for screening for other disorder: Secondary | ICD-10-CM

## 2018-07-03 DIAGNOSIS — Z363 Encounter for antenatal screening for malformations: Secondary | ICD-10-CM | POA: Diagnosis not present

## 2018-07-03 DIAGNOSIS — R87610 Atypical squamous cells of undetermined significance on cytologic smear of cervix (ASC-US): Secondary | ICD-10-CM | POA: Diagnosis not present

## 2018-07-03 DIAGNOSIS — Z3482 Encounter for supervision of other normal pregnancy, second trimester: Secondary | ICD-10-CM

## 2018-07-03 DIAGNOSIS — Z331 Pregnant state, incidental: Secondary | ICD-10-CM

## 2018-07-03 LAB — POCT URINALYSIS DIPSTICK OB
Blood, UA: NEGATIVE
Glucose, UA: NEGATIVE
Leukocytes, UA: NEGATIVE
NITRITE UA: NEGATIVE
PROTEIN: NEGATIVE

## 2018-07-03 MED ORDER — METRONIDAZOLE 0.75 % VA GEL: VAGINAL | 0 refills | Status: DC

## 2018-07-03 NOTE — Progress Notes (Signed)
[place] 19+1 wks,cephalic,fundal placenta gr 0,cx 2.5 cm,svp of fluid 4.3 cm,fhr 140 bpm,two LVEICF 1.4 mm,efw 261 g 29%,anatomy complete

## 2018-07-03 NOTE — Progress Notes (Signed)
LOW-RISK PREGNANCY VISIT Patient name: BRECKIN BRUN MRN [id number]  Date of birth: 23-Mar-1996 Chief Complaint:   Routine Prenatal Visit (colpo)  History of Present Illness:   Chelsea Burgess is a 23 y.o. G2P0010 female at [redacted]w[redacted]d with an Estimated Date of Delivery: 11/26/18 being seen today for ongoing management of a low-risk pregnancy.  Today she reports no complaints. Contractions: Not present. Vag. Bleeding: None.  Movement: Present. denies leaking of fluid. Review of Systems:   Pertinent items are noted in HPI Denies abnormal vaginal discharge w/ itching/odor/irritation, headaches, visual changes, shortness of breath, chest pain, abdominal pain, severe nausea/vomiting, or problems with urination or bowel movements unless otherwise stated above. Pertinent History Reviewed:  Reviewed past medical,surgical, social, obstetrical and family history.  Reviewed problem list, medications and allergies. Physical Assessment:   Vitals:   07/03/18 1509  BP: 129/89  Pulse: 73  Weight: 132 lb (59.9 kg)  Body mass index is 20.67 kg/m.        Physical Examination:   General appearance: Well appearing, and in no distress  Mental status: Alert, oriented to person, place, and time  Skin: Warm & dry  Cardiovascular: Normal heart rate noted  Respiratory: Normal respiratory effort, no distress  Abdomen: Soft, gravid, nontender  Pelvic: Cervical exam deferred         Extremities: Edema: None  Fetal Status:     Movement: Present    Results for orders placed or performed in visit on 07/03/18 (from the past 24 hour(s))  POC Urinalysis Dipstick OB   Collection Time: 07/03/18  3:07 PM  Result Value Ref Range   Color, UA     Clarity, UA     Glucose, UA Negative Negative   Bilirubin, UA     Ketones, UA mod    Spec Grav, UA     Blood, UA neg    pH, UA     POC,PROTEIN,UA Negative Negative, Trace, Small (1+), Moderate (2+), Large (3+), 4+   Urobilinogen, UA     Nitrite, UA neg    Leukocytes, UA Negative Negative   Appearance     Odor      Assessment & Plan:  1) Low-risk pregnancy G2P0010 at [redacted]w[redacted]d with an Estimated Date of Delivery: 11/26/18   2) LV EICF x 2, isolated, normal quad screen, no indication for re evaluation   Meds:  Meds ordered this encounter  Medications  . metroNIDAZOLE (METROGEL VAGINAL) 0.75 % vaginal gel    Sig: Nightly x 5 nights    Dispense:  70 g    Refill:  0   Labs/procedures today:   Plan:  Continue routine obstetrical care   Reviewed:  labor symptoms and general obstetric precautions including but not limited to vaginal bleeding, contractions, leaking of fluid and fetal movement were reviewed in detail with the patient.  All questions were answered  Follow-up: Return in about 4 weeks (around 07/31/2018) for LROB.  Orders Placed This Encounter  Procedures  . POC Urinalysis Dipstick OB   Lazaro Arms  07/03/2018 3:31 PM   Colposcopy Procedure Note:  Colposcopy Procedure Note  Indications: Pap smear 1 months ago showed: ASCUS with POSITIVE high risk HPV. The prior pap showed no abnormalities.  Prior cervical/vaginal disease: normal exam without visible pathology. Prior cervical treatment: no treatment.  Smoker:  No. New sexual partner:  No.  : time frame:    History of abnormal Pap: no  Procedure Details  The risks and benefits of the procedure  and Written informed consent obtained.  Speculum placed in vagina and excellent visualization of cervix achieved, cervix swabbed x 3 with acetic acid solution.  Findings: Cervix: no visible lesions, no mosaicism, no punctation and no abnormal vasculature; SCJ visualized 360 degrees without lesions and no biopsies taken. Vaginal inspection: normal without visible lesions. Vulvar colposcopy: vulvar colposcopy not performed.  Specimens: none  Complications: none.  Plan: Repeat Pap 1 year

## 2018-07-04 NOTE — Telephone Encounter (Signed)
error 

## 2018-07-31 ENCOUNTER — Encounter: Payer: BLUE CROSS/BLUE SHIELD | Admitting: Obstetrics and Gynecology

## 2018-08-05 ENCOUNTER — Encounter: Payer: BLUE CROSS/BLUE SHIELD | Admitting: Obstetrics and Gynecology

## 2018-08-12 ENCOUNTER — Encounter: Payer: Self-pay | Admitting: Women's Health

## 2018-08-12 ENCOUNTER — Ambulatory Visit (INDEPENDENT_AMBULATORY_CARE_PROVIDER_SITE_OTHER): Payer: BLUE CROSS/BLUE SHIELD | Admitting: Women's Health

## 2018-08-12 VITALS — BP 116/73 | HR 71 | Wt 133.0 lb

## 2018-08-12 DIAGNOSIS — Z3A24 24 weeks gestation of pregnancy: Secondary | ICD-10-CM

## 2018-08-12 DIAGNOSIS — Z3482 Encounter for supervision of other normal pregnancy, second trimester: Secondary | ICD-10-CM

## 2018-08-12 DIAGNOSIS — Z331 Pregnant state, incidental: Secondary | ICD-10-CM

## 2018-08-12 DIAGNOSIS — Z1389 Encounter for screening for other disorder: Secondary | ICD-10-CM

## 2018-08-12 LAB — POCT URINALYSIS DIPSTICK OB
Blood, UA: NEGATIVE
Glucose, UA: NEGATIVE
KETONES UA: NEGATIVE
Leukocytes, UA: NEGATIVE
NITRITE UA: NEGATIVE

## 2018-08-12 NOTE — Progress Notes (Signed)
   LOW-RISK PREGNANCY VISIT Patient name: Chelsea Burgess MRN [id number]  Date of birth: 1995/11/20 Chief Complaint:   Routine Prenatal Visit  History of Present Illness:   Chelsea Burgess is a 23 y.o. G66P0010 female at [redacted]w[redacted]d with an Estimated Date of Delivery: 11/26/18 being seen today for ongoing management of a low-risk pregnancy.  Today she reports no complaints. Contractions: Not present.  .  Movement: Present. denies leaking of fluid. Review of Systems:   Pertinent items are noted in HPI Denies abnormal vaginal discharge w/ itching/odor/irritation, headaches, visual changes, shortness of breath, chest pain, abdominal pain, severe nausea/vomiting, or problems with urination or bowel movements unless otherwise stated above. Pertinent History Reviewed:  Reviewed past medical,surgical, social, obstetrical and family history.  Reviewed problem list, medications and allergies. Physical Assessment:   Vitals:   08/12/18 0936  BP: 116/73  Pulse: 71  Weight: 133 lb (60.3 kg)  Body mass index is 20.83 kg/m.        Physical Examination:   General appearance: Well appearing, and in no distress  Mental status: Alert, oriented to person, place, and time  Skin: Warm & dry  Cardiovascular: Normal heart rate noted  Respiratory: Normal respiratory effort, no distress  Abdomen: Soft, gravid, nontender  Pelvic: Cervical exam deferred         Extremities: Edema: None  Fetal Status: Fetal Heart Rate (bpm): 152 Fundal Height: 22 cm Movement: Present    Results for orders placed or performed in visit on 08/12/18 (from the past 24 hour(s))  POC Urinalysis Dipstick OB   Collection Time: 08/12/18  9:38 AM  Result Value Ref Range   Color, UA     Clarity, UA     Glucose, UA Negative Negative   Bilirubin, UA     Ketones, UA neg    Spec Grav, UA     Blood, UA neg    pH, UA     POC,PROTEIN,UA Trace Negative, Trace, Small (1+), Moderate (2+), Large (3+), 4+   Urobilinogen, UA     Nitrite, UA  neg    Leukocytes, UA Negative Negative   Appearance     Odor      Assessment & Plan:  1) Low-risk pregnancy G2P0010 at [redacted]w[redacted]d with an Estimated Date of Delivery: 11/26/18    Meds: No orders of the defined types were placed in this encounter.  Labs/procedures today: none  Plan:  Continue routine obstetrical care   Reviewed: Preterm labor symptoms and general obstetric precautions including but not limited to vaginal bleeding, contractions, leaking of fluid and fetal movement were reviewed in detail with the patient.  All questions were answered  Follow-up: Return in about 22 days (around 09/03/2018) for LROB, PN2.  Orders Placed This Encounter  Procedures  . POC Urinalysis Dipstick OB   Cheral Marker CNM, [facility] 08/12/2018 10:03 AM

## 2018-08-12 NOTE — Patient Instructions (Addendum)
Chelsea Burgess, I greatly value your feedback.  If you receive a survey following your visit with us today, we appreciate you taking the time to fill it out.  Thanks, Joellyn HaffKim Ronald Londo, CNM, WHNP-BC   You will have your sugar test next visit.  Please do not eat or drink anything after midnight the night before you come, not even water.  You will be here for at least two hours.   [facility] to [facility] ([address] [place], [place] [address]) on Sunday August 17, 2018 at 5:00am It will be called the [facility], and it is located off of [address] DO NOT GO TO [address] (the current [facility]) on February 23rd or after, no one will be there!      Call the office [phone]) or go to [facility] if:  You begin to have strong, frequent contractions  Your water breaks.  Sometimes it is a big gush of fluid, sometimes it is just a trickle that keeps getting your panties wet or running down your legs  You have vaginal bleeding.  It is normal to have a small amount of spotting if your cervix was checked.   You don't feel your baby moving like normal.  If you don't, get you something to eat and drink and lay down and focus on feeling your baby move.   If your baby is still not moving like normal, you should call the office or go to [facility].  Second Trimester of Pregnancy The second trimester is from week 13 through week 28, months 4 through 6. The second trimester is often a time when you feel your best. Your body has also adjusted to being pregnant, and you begin to feel better physically. Usually, morning sickness has lessened or quit completely, you may have more energy, and you may have an increase in appetite. The second trimester is also a time when the fetus is growing rapidly. At the end of the sixth month, the fetus is about 9 inches long and weighs about 1 pounds. You will likely begin to feel the baby move  (quickening) between 18 and 20 weeks of the pregnancy. BODY CHANGES Your body goes through many changes during pregnancy. The changes vary from woman to woman.   Your weight will continue to increase. You will notice your lower abdomen bulging out.  You may begin to get stretch marks on your hips, abdomen, and breasts.  You may develop headaches that can be relieved by medicines approved by your health care provider.  You may urinate more often because the fetus is pressing on your bladder.  You may develop or continue to have heartburn as a result of your pregnancy.  You may develop constipation because certain hormones are causing the muscles that push waste through your intestines to slow down.  You may develop hemorrhoids or swollen, bulging veins (varicose veins).  You may have back pain because of the weight gain and pregnancy hormones relaxing your joints between the bones in your pelvis and as a result of a shift in weight and the muscles that support your balance.  Your breasts will continue to grow and be tender.  Your gums may bleed and may be sensitive to brushing and flossing.  Dark spots or blotches (chloasma, mask of pregnancy) may develop on your face. This will likely fade after the baby is born.  A dark line from your belly button  to the pubic area (linea nigra) may appear. This will likely fade after the baby is born.  You may have changes in your hair. These can include thickening of your hair, rapid growth, and changes in texture. Some women also have hair loss during or after pregnancy, or hair that feels dry or thin. Your hair will most likely return to normal after your baby is born. WHAT TO EXPECT AT YOUR PRENATAL VISITS During a routine prenatal visit:  You will be weighed to make sure you and the fetus are growing normally.  Your blood pressure will be taken.  Your abdomen will be measured to track your baby's growth.  The fetal heartbeat will be  listened to.  Any test results from the previous visit will be discussed. Your health care provider may ask you:  How you are feeling.  If you are feeling the baby move.  If you have had any abnormal symptoms, such as leaking fluid, bleeding, severe headaches, or abdominal cramping.  If you have any questions. Other tests that may be performed during your second trimester include:  Blood tests that check for:  Low iron levels (anemia).  Gestational diabetes (between 24 and 28 weeks).  Rh antibodies.  Urine tests to check for infections, diabetes, or protein in the urine.  An ultrasound to confirm the proper growth and development of the baby.  An amniocentesis to check for possible genetic problems.  Fetal screens for spina bifida and Down syndrome. HOME CARE INSTRUCTIONS   Avoid all smoking, herbs, alcohol, and unprescribed drugs. These chemicals affect the formation and growth of the baby.  Follow your health care provider's instructions regarding medicine use. There are medicines that are either safe or unsafe to take during pregnancy.  Exercise only as directed by your health care provider. Experiencing uterine cramps is a good sign to stop exercising.  Continue to eat regular, healthy meals.  Wear a good support bra for breast tenderness.  Do not use hot tubs, steam rooms, or saunas.  Wear your seat belt at all times when driving.  Avoid raw meat, uncooked cheese, cat litter boxes, and soil used by cats. These carry germs that can cause birth defects in the baby.  Take your prenatal vitamins.  Try taking a stool softener (if your health care provider approves) if you develop constipation. Eat more high-fiber foods, such as fresh vegetables or fruit and whole grains. Drink plenty of fluids to keep your urine clear or pale yellow.  Take warm sitz baths to soothe any pain or discomfort caused by hemorrhoids. Use hemorrhoid cream if your health care provider  approves.  If you develop varicose veins, wear support hose. Elevate your feet for 15 minutes, 3-4 times a day. Limit salt in your diet.  Avoid heavy lifting, wear low heel shoes, and practice good posture.  Rest with your legs elevated if you have leg cramps or low back pain.  Visit your dentist if you have not gone yet during your pregnancy. Use a soft toothbrush to brush your teeth and be gentle when you floss.  A sexual relationship may be continued unless your health care provider directs you otherwise.  Continue to go to all your prenatal visits as directed by your health care provider. SEEK MEDICAL CARE IF:   You have dizziness.  You have mild pelvic cramps, pelvic pressure, or nagging pain in the abdominal area.  You have persistent nausea, vomiting, or diarrhea.  You have a bad smelling vaginal discharge.  You have pain with urination. SEEK IMMEDIATE MEDICAL CARE IF:   You have a fever.  You are leaking fluid from your vagina.  You have spotting or bleeding from your vagina.  You have severe abdominal cramping or pain.  You have rapid weight gain or loss.  You have shortness of breath with chest pain.  You notice sudden or extreme swelling of your face, hands, ankles, feet, or legs.  You have not felt your baby move in over an hour.  You have severe headaches that do not go away with medicine.  You have vision changes. Document Released: 06/05/2001 Document Revised: 06/16/2013 Document Reviewed: 08/12/2012 [facility] Patient Information 2015 [place], [place]. This information is not intended to replace advice given to you by your health care provider. Make sure you discuss any questions you have with your health care provider.  AREA PEDIATRIC/FAMILY PRACTICE PHYSICIANS  [facility] [address] [place], [place] [address] Phone - [phone]   Fax - [phone]  JACK AMOS [address] [place], [place]  [address] Phone -  [phone]   Fax - [phone]  [facility] [address] [place], [place]  [address] Phone - [phone]   Fax - [phone]  [facility] [address] [place], [place]  [address] Phone - [phone]   Fax - [phone]  [facility] [address] [place], [place]  [address] Phone - [phone]   Fax - [phone]  [facility] [address] [place], [place]  [address] Phone - [phone]   Fax - [phone]  [facility] [place] [address] [place], [place]  [address] Phone - [phone]   Fax - [phone]  [facility] AT [facility] [address] [place], [address] [place], [place]  [address] Phone - [phone]   Fax - [phone]  [facility] AT [facility] [address] [place], [place]  [address] Phone - [phone]   Fax - [phone] [facility] AT [place] [address] [place], [place]  [address] Phone - [phone]   Fax - [phone]  [facility] [address] [place], [place]  [address] Phone - [phone]   Fax - [phone]  [facility] [address] [place], [place]  [address] Phone - [phone]   Fax - [phone]  [facility] [place] [address] [place], [place]  [address] Phone - [phone]   Fax - [phone]  [facility] [address], [address] [place], [place]  [address] Phone - [phone]  [facility] [address] [place], [place]  [address] Phone - [phone]   Fax - [phone]  [facility] [address] [place], [place]  [address] Phone - [phone]   Fax - [phone]  [facility] [address] [place], [place]  [address] Phone - [phone]   Fax - [phone]  [facility] [address] [place], [place]  [address] Phone - [phone]   Fax - [phone]   [facility] [address] [place], [place]  [address] Phone - [phone]   Fax - [phone]  [facility] [address] [place], [place]  [address] Phone - [phone]   Fax - [phone]  DAVID RUBIN [address] [place], [place]  [address] Phone - [phone]  Fax - [phone]  [facility] [address] [place], [place]  [address] Phone - [phone]   Fax - [phone]  [place] [facility] Alita Chyle [address] [place], [place]  [address] Phone - [phone]   Fax - [phone] Gerarda Fraction [address] [place], [place]  [address] Phone - [phone]   Fax - [phone]  [facility] [address] [place], [place]  [address] Phone - [phone]   Fax - [phone]  [facility] [place] [address] [place], [place]  [address] Phone - [phone]   Fax - [phone]

## 2018-09-03 ENCOUNTER — Ambulatory Visit (INDEPENDENT_AMBULATORY_CARE_PROVIDER_SITE_OTHER): Payer: Medicaid Other | Admitting: Obstetrics and Gynecology

## 2018-09-03 ENCOUNTER — Other Ambulatory Visit: Payer: Self-pay

## 2018-09-03 ENCOUNTER — Other Ambulatory Visit: Payer: Medicaid Other

## 2018-09-03 ENCOUNTER — Encounter: Payer: Self-pay | Admitting: Obstetrics and Gynecology

## 2018-09-03 VITALS — BP 115/73 | HR 112 | Wt 135.2 lb

## 2018-09-03 DIAGNOSIS — Z3483 Encounter for supervision of other normal pregnancy, third trimester: Secondary | ICD-10-CM

## 2018-09-03 DIAGNOSIS — Z3403 Encounter for supervision of normal first pregnancy, third trimester: Secondary | ICD-10-CM

## 2018-09-03 DIAGNOSIS — Z3A28 28 weeks gestation of pregnancy: Secondary | ICD-10-CM

## 2018-09-03 DIAGNOSIS — Z331 Pregnant state, incidental: Secondary | ICD-10-CM

## 2018-09-03 DIAGNOSIS — Z1389 Encounter for screening for other disorder: Secondary | ICD-10-CM

## 2018-09-03 LAB — POCT URINALYSIS DIPSTICK OB
GLUCOSE, UA: NEGATIVE
Ketones, UA: NEGATIVE
Nitrite, UA: NEGATIVE
RBC UA: NEGATIVE

## 2018-09-03 NOTE — Progress Notes (Signed)
Patient ID: Chelsea Burgess, female   DOB: Apr 21, 1996, 23 y.o.   MRN: [id number]    LOW-RISK PREGNANCY VISIT Patient name: Chelsea Burgess MRN [id number]  Date of birth: 05-Sep-1995 Chief Complaint:   Routine Prenatal Visit (PN2 today)  History of Present Illness:   Chelsea Burgess is a 23 y.o. G2P0010 female at [redacted]w[redacted]d with an Estimated Date of Delivery: 11/26/18 being seen today for ongoing management of a low-risk pregnancy. Both her and FOB are very excited about pregnancy. Today she reports no complaints. Contractions: Not present. Vag. Bleeding: None.  Movement: Present. denies leaking of fluid. Review of Systems:   Pertinent items are noted in HPI Denies abnormal vaginal discharge w/ itching/odor/irritation, headaches, visual changes, shortness of breath, chest pain, abdominal pain, severe nausea/vomiting, or problems with urination or bowel movements unless otherwise stated above. Pertinent History Reviewed:  Reviewed past medical,surgical, social, obstetrical and family history.  Reviewed problem list, medications and allergies. Physical Assessment:   Vitals:   09/03/18 1022 09/03/18 1027  BP: (!) 142/87 115/73  Pulse: (!) 123 (!) 112  Weight: 135 lb 3.2 oz (61.3 kg)   Body mass index is 21.18 kg/m.        Physical Examination:   General appearance: Well appearing, and in no distress  Mental status: Alert, oriented to person, place, and time  Skin: Warm & dry  Cardiovascular: Normal heart rate noted  Respiratory: Normal respiratory effort, no distress  Abdomen: Soft, gravid, nontender  Pelvic: Cervical exam deferred         Extremities: Edema: None  Fetal Status: Fetal Heart Rate (bpm): 152 Fundal Height: 26 cm Movement: Present    Results for orders placed or performed in visit on 09/03/18 (from the past 24 hour(s))  POC Urinalysis Dipstick OB   Collection Time: 09/03/18 10:23 AM  Result Value Ref Range   Color, UA     Clarity, UA     Glucose, UA Negative  Negative   Bilirubin, UA     Ketones, UA neg    Spec Grav, UA     Blood, UA neg    pH, UA     POC,PROTEIN,UA Trace Negative, Trace, Small (1+), Moderate (2+), Large (3+), 4+   Urobilinogen, UA     Nitrite, UA neg    Leukocytes, UA Trace (A) Negative   Appearance     Odor      Assessment & Plan:  1) Low-risk pregnancy G2P0010 at [redacted]w[redacted]d with an Estimated Date of Delivery: 11/26/18    Meds: No orders of the defined types were placed in this encounter.  Labs/procedures today: PN2  Plan:   1. Continue routine obstetrical care  2. F/u in 4 weeks 3. Has discussed child-birthing classes with plans to attend. 4. T-dap next visit  Follow-up: Return in about 2 weeks (around 09/17/2018).  Orders Placed This Encounter  Procedures  . POC Urinalysis Dipstick OB   By signing my name below, I, Arnette Norris, attest that this documentation has been prepared under the direction and in the presence of Tilda Burrow, MD. Electronically Signed: Arnette Norris Medical Scribe. 09/03/18. 10:55 AM.  I personally performed the services described in this documentation, which was SCRIBED in my presence. The recorded information has been reviewed and considered accurate. It has been edited as necessary during review. Tilda Burrow, MD

## 2018-09-04 LAB — CBC
HEMATOCRIT: 36.4 % (ref 34.0–46.6)
HEMOGLOBIN: 12.3 g/dL (ref 11.1–15.9)
MCH: 29.8 pg (ref 26.6–33.0)
MCHC: 33.8 g/dL (ref 31.5–35.7)
MCV: 88 fL (ref 79–97)
Platelets: 203 10*3/uL (ref 150–450)
RBC: 4.13 x10E6/uL (ref 3.77–5.28)
RDW: 13.1 % (ref 11.7–15.4)
WBC: 9.5 10*3/uL (ref 3.4–10.8)

## 2018-09-04 LAB — RPR: RPR: NONREACTIVE

## 2018-09-04 LAB — GLUCOSE TOLERANCE, 2 HOURS W/ 1HR
GLUCOSE, 2 HOUR: 63 mg/dL — AB (ref 65–152)
GLUCOSE, FASTING: 74 mg/dL (ref 65–91)
Glucose, 1 hour: 122 mg/dL (ref 65–179)

## 2018-09-04 LAB — ANTIBODY SCREEN: ANTIBODY SCREEN: NEGATIVE

## 2018-09-04 LAB — HIV ANTIBODY (ROUTINE TESTING W REFLEX): HIV Screen 4th Generation wRfx: NONREACTIVE

## 2018-09-09 ENCOUNTER — Telehealth: Payer: Self-pay | Admitting: Obstetrics and Gynecology

## 2018-09-09 NOTE — Telephone Encounter (Signed)
LMOVM returning patient's call.  

## 2018-09-09 NOTE — Telephone Encounter (Signed)
Patient called, is requesting a note for work stating that she is high risk pregnancy.  [phone]

## 2018-09-10 NOTE — Telephone Encounter (Signed)
Patient states she is needing a note for work because she is high risk and cannot work.  I asked the patient why she is high risk and she stated she read on the Internet that pregnant women were at high risk.  I informed that patient that at this time there are not any restrictions for pregnant patients and should practice standard precautions such as social distancing, hand washing, etc. Her pregnancy is not considered high risk at this time either so she is fine to continue working. Pt verbalized understanding.

## 2018-09-15 ENCOUNTER — Telehealth: Payer: Self-pay | Admitting: *Deleted

## 2018-09-15 ENCOUNTER — Telehealth: Payer: Self-pay | Admitting: Advanced Practice Midwife

## 2018-09-15 NOTE — Telephone Encounter (Signed)
Patient's mom called stating that her daughter is pregnant and she is due in June. Pt's mom states that her Job is giving her daughter 2 weeks off because of the covid-19 and she is pregnant, but they are needing a letter stating that it is okay and recommended do to the Virus. Please contact pt

## 2018-09-15 NOTE — Telephone Encounter (Signed)
Pt requesting note for work d/t coronavirus. Advised that a note would be available to pick up at her convenience.

## 2018-09-30 ENCOUNTER — Telehealth: Payer: Self-pay | Admitting: *Deleted

## 2018-09-30 NOTE — Telephone Encounter (Signed)
Erroneous encounter

## 2018-10-01 ENCOUNTER — Ambulatory Visit (INDEPENDENT_AMBULATORY_CARE_PROVIDER_SITE_OTHER): Payer: Medicaid Other | Admitting: Advanced Practice Midwife

## 2018-10-01 ENCOUNTER — Other Ambulatory Visit: Payer: Self-pay

## 2018-10-01 ENCOUNTER — Encounter: Payer: Self-pay | Admitting: Advanced Practice Midwife

## 2018-10-01 VITALS — BP 113/79 | HR 88 | Temp 98.3°F | Wt 135.5 lb

## 2018-10-01 DIAGNOSIS — O360130 Maternal care for anti-D [Rh] antibodies, third trimester, not applicable or unspecified: Secondary | ICD-10-CM

## 2018-10-01 DIAGNOSIS — Z6791 Unspecified blood type, Rh negative: Secondary | ICD-10-CM

## 2018-10-01 DIAGNOSIS — Z23 Encounter for immunization: Secondary | ICD-10-CM

## 2018-10-01 DIAGNOSIS — O26893 Other specified pregnancy related conditions, third trimester: Secondary | ICD-10-CM

## 2018-10-01 DIAGNOSIS — Z1389 Encounter for screening for other disorder: Secondary | ICD-10-CM

## 2018-10-01 DIAGNOSIS — Z3483 Encounter for supervision of other normal pregnancy, third trimester: Secondary | ICD-10-CM

## 2018-10-01 DIAGNOSIS — Z3A32 32 weeks gestation of pregnancy: Secondary | ICD-10-CM

## 2018-10-01 DIAGNOSIS — Z331 Pregnant state, incidental: Secondary | ICD-10-CM

## 2018-10-01 LAB — POCT URINALYSIS DIPSTICK OB
Blood, UA: NEGATIVE
Glucose, UA: NEGATIVE
Ketones, UA: NEGATIVE
Nitrite, UA: NEGATIVE

## 2018-10-01 MED ORDER — METRONIDAZOLE 0.75 % VA GEL: 1.0000 | Freq: Every day | VAGINAL | 0 refills | Status: AC

## 2018-10-01 MED ORDER — BLOOD PRESSURE MONITOR AUTOMAT DEVI: 0 refills | Status: DC

## 2018-10-01 NOTE — Progress Notes (Signed)
White vaginal discharge with odor.  

## 2018-10-01 NOTE — Addendum Note (Signed)
Addended by: Colen Darling on: 10/01/2018 03:37 PM   Modules accepted: Orders

## 2018-10-01 NOTE — Progress Notes (Signed)
  G2P0010 [redacted]w[redacted]d Estimated Date of Delivery: 11/26/18  Blood pressure 113/79, pulse 88, temperature 98.3 F (36.8 C), weight 135 lb 8 oz (61.5 kg), last menstrual period 02/19/2018, unknown if currently breastfeeding.   BP weight and urine results all reviewed and noted.  Please refer to the obstetrical flow sheet for the fundal height and fetal heart rate documentation:  Patient reports good fetal movement, denies any bleeding and no rupture of membranes symptoms or regular contractions. Patient c/o foul vaginal odor.  All questions were answered.   Physical Assessment:   Vitals:   10/01/18 1452  BP: 113/79  Pulse: 88  Temp: 98.3 F (36.8 C)  Weight: 135 lb 8 oz (61.5 kg)  Body mass index is 21.22 kg/m.        Physical Examination:   General appearance: Well appearing, and in no distres  Mental status: Alert, oriented to person, place, and time  Skin: Warm & dry  Cardiovascular: Normal heart rate noted  Respiratory: Normal respiratory effort, no distress  Abdomen: Soft, gravid, nontender  Pelvic: SSE:  Frothy white discharge, wet prep WBC, no trich or yeast, few clue         Extremities: Edema: None  Fetal Status:     Movement: Present    Results for orders placed or performed in visit on 10/01/18 (from the past 24 hour(s))  POC Urinalysis Dipstick OB   Collection Time: 10/01/18  2:53 PM  Result Value Ref Range   Color, UA     Clarity, UA     Glucose, UA Negative Negative   Bilirubin, UA     Ketones, UA neg    Spec Grav, UA     Blood, UA neg    pH, UA     POC,PROTEIN,UA Trace Negative, Trace, Small (1+), Moderate (2+), Large (3+), 4+   Urobilinogen, UA     Nitrite, UA neg    Leukocytes, UA Moderate (2+) (A) Negative   Appearance     Odor       Orders Placed This Encounter  Procedures  . RHO (D) Immune Globulin  . POC Urinalysis Dipstick OB    Plan:  Continued routine obstetrical care, rx metrogel   TDAP/rhogam today.  BP cuff and mcaid prior auth sent in  w/rx  Return in about 2 weeks (around 10/15/2018) for web ex visit.

## 2018-10-01 NOTE — Patient Instructions (Signed)
Chelsea Burgess, I greatly value your feedback.  If you receive a survey following your visit with [place] today, we appreciate you taking the time to fill it out.  Thanks, Cathie Beams, CNM   Call the office [phone]) or go to [facility] if:  You begin to have strong, frequent contractions  Your water breaks.  Sometimes it is a big gush of fluid, sometimes it is just a trickle that keeps getting your panties wet or running down your legs  You have vaginal bleeding.  It is normal to have a small amount of spotting if your cervix was checked.   You don't feel your baby moving like normal.  If you don't, get you something to eat and drink and lay down and focus on feeling your baby move.  You should feel at least 10 movements in 2 hours.  If you don't, you should call the office or go to [facility].    Tdap Vaccine  It is recommended that you get the Tdap vaccine during the third trimester of EACH pregnancy to help protect your baby from getting pertussis (whooping cough)  27-36 weeks is the BEST time to do this so that you can pass the protection on to your baby. During pregnancy is better than after pregnancy, but if you are unable to get it during pregnancy it will be offered at the hospital.   You will be offered this vaccine in the office after 27 weeks. If you do not have health insurance, you can get this vaccine at the health department or your family doctor  Everyone who will be around your baby should also be up-to-date on their vaccines. Adults (who are not pregnant) only need 1 dose of Tdap during adulthood.   Third Trimester of Pregnancy The third trimester is from week 29 through week 42, months 7 through 9. The third trimester is a time when the fetus is growing rapidly. At the end of the ninth month, the fetus is about 20 inches in length and weighs 6-10 pounds.  BODY CHANGES Your body goes through many changes during pregnancy. The changes vary from woman  to woman.   Your weight will continue to increase. You can expect to gain 25-35 pounds (11-16 kg) by the end of the pregnancy.  You may begin to get stretch marks on your hips, abdomen, and breasts.  You may urinate more often because the fetus is moving lower into your pelvis and pressing on your bladder.  You may develop or continue to have heartburn as a result of your pregnancy.  You may develop constipation because certain hormones are causing the muscles that push waste through your intestines to slow down.  You may develop hemorrhoids or swollen, bulging veins (varicose veins).  You may have pelvic pain because of the weight gain and pregnancy hormones relaxing your joints between the bones in your pelvis. Backaches may result from overexertion of the muscles supporting your posture.  You may have changes in your hair. These can include thickening of your hair, rapid growth, and changes in texture. Some women also have hair loss during or after pregnancy, or hair that feels dry or thin. Your hair will most likely return to normal after your baby is born.  Your breasts will continue to grow and be tender. A yellow discharge may leak from your breasts called colostrum.  Your belly button may stick out.  You may feel short of breath because of your expanding uterus.  You may notice the fetus "dropping," or moving lower in your abdomen.  You may have a bloody mucus discharge. This usually occurs a few days to a week before labor begins.  Your cervix becomes thin and soft (effaced) near your due date. WHAT TO EXPECT AT YOUR PRENATAL EXAMS  You will have prenatal exams every 2 weeks until week 36. Then, you will have weekly prenatal exams. During a routine prenatal visit:  You will be weighed to make sure you and the fetus are growing normally.  Your blood pressure is taken.  Your abdomen will be measured to track your baby's growth.  The fetal heartbeat will be listened  to.  Any test results from the previous visit will be discussed.  You may have a cervical check near your due date to see if you have effaced. At around 36 weeks, your caregiver will check your cervix. At the same time, your caregiver will also perform a test on the secretions of the vaginal tissue. This test is to determine if a type of bacteria, Group B streptococcus, is present. Your caregiver will explain this further. Your caregiver may ask you:  What your birth plan is.  How you are feeling.  If you are feeling the baby move.  If you have had any abnormal symptoms, such as leaking fluid, bleeding, severe headaches, or abdominal cramping.  If you have any questions. Other tests or screenings that may be performed during your third trimester include:  Blood tests that check for low iron levels (anemia).  Fetal testing to check the health, activity level, and growth of the fetus. Testing is done if you have certain medical conditions or if there are problems during the pregnancy. FALSE LABOR You may feel small, irregular contractions that eventually go away. These are called Braxton Hicks contractions, or false labor. Contractions may last for hours, days, or even weeks before true labor sets in. If contractions come at regular intervals, intensify, or become painful, it is best to be seen by your caregiver.  SIGNS OF LABOR   Menstrual-like cramps.  Contractions that are 5 minutes apart or less.  Contractions that start on the top of the uterus and spread down to the lower abdomen and back.  A sense of increased pelvic pressure or back pain.  A watery or bloody mucus discharge that comes from the vagina. If you have any of these signs before the 37th week of pregnancy, call your caregiver right away. You need to go to the hospital to get checked immediately. HOME CARE INSTRUCTIONS   Avoid all smoking, herbs, alcohol, and unprescribed drugs. These chemicals affect the  formation and growth of the baby.  Follow your caregiver's instructions regarding medicine use. There are medicines that are either safe or unsafe to take during pregnancy.  Exercise only as directed by your caregiver. Experiencing uterine cramps is a good sign to stop exercising.  Continue to eat regular, healthy meals.  Wear a good support bra for breast tenderness.  Do not use hot tubs, steam rooms, or saunas.  Wear your seat belt at all times when driving.  Avoid raw meat, uncooked cheese, cat litter boxes, and soil used by cats. These carry germs that can cause birth defects in the baby.  Take your prenatal vitamins.  Try taking a stool softener (if your caregiver approves) if you develop constipation. Eat more high-fiber foods, such as fresh vegetables or fruit and whole grains. Drink plenty of fluids to keep your urine  clear or pale yellow.  Take warm sitz baths to soothe any pain or discomfort caused by hemorrhoids. Use hemorrhoid cream if your caregiver approves.  If you develop varicose veins, wear support hose. Elevate your feet for 15 minutes, 3-4 times a day. Limit salt in your diet.  Avoid heavy lifting, wear low heal shoes, and practice good posture.  Rest a lot with your legs elevated if you have leg cramps or low back pain.  Visit your dentist if you have not gone during your pregnancy. Use a soft toothbrush to brush your teeth and be gentle when you floss.  A sexual relationship may be continued unless your caregiver directs you otherwise.  Do not travel far distances unless it is absolutely necessary and only with the approval of your caregiver.  Take prenatal classes to understand, practice, and ask questions about the labor and delivery.  Make a trial run to the hospital.  Pack your hospital bag.  Prepare the baby's nursery.  Continue to go to all your prenatal visits as directed by your caregiver. SEEK MEDICAL CARE IF:  You are unsure if you are in  labor or if your water has broken.  You have dizziness.  You have mild pelvic cramps, pelvic pressure, or nagging pain in your abdominal area.  You have persistent nausea, vomiting, or diarrhea.  You have a bad smelling vaginal discharge.  You have pain with urination. SEEK IMMEDIATE MEDICAL CARE IF:   You have a fever.  You are leaking fluid from your vagina.  You have spotting or bleeding from your vagina.  You have severe abdominal cramping or pain.  You have rapid weight loss or gain.  You have shortness of breath with chest pain.  You notice sudden or extreme swelling of your face, hands, ankles, feet, or legs.  You have not felt your baby move in over an hour.  You have severe headaches that do not go away with medicine.  You have vision changes. Document Released: 06/05/2001 Document Revised: 06/16/2013 Document Reviewed: 08/12/2012 [facility] Patient Information 2015 [place], [place]. This information is not intended to replace advice given to you by your health care provider. Make sure you discuss any questions you have with your health care provider.

## 2018-10-02 ENCOUNTER — Encounter: Payer: Self-pay | Admitting: *Deleted

## 2018-10-03 ENCOUNTER — Encounter: Payer: Self-pay | Admitting: Advanced Practice Midwife

## 2018-10-06 ENCOUNTER — Encounter: Payer: Self-pay | Admitting: *Deleted

## 2018-10-09 ENCOUNTER — Encounter: Payer: Self-pay | Admitting: *Deleted

## 2018-10-14 ENCOUNTER — Ambulatory Visit (INDEPENDENT_AMBULATORY_CARE_PROVIDER_SITE_OTHER): Payer: Medicaid Other | Admitting: Women's Health

## 2018-10-14 ENCOUNTER — Encounter: Payer: Self-pay | Admitting: Women's Health

## 2018-10-14 ENCOUNTER — Other Ambulatory Visit: Payer: Self-pay

## 2018-10-14 VITALS — BP 119/77 | HR 78

## 2018-10-14 DIAGNOSIS — Z3A33 33 weeks gestation of pregnancy: Secondary | ICD-10-CM | POA: Diagnosis not present

## 2018-10-14 DIAGNOSIS — Z3483 Encounter for supervision of other normal pregnancy, third trimester: Secondary | ICD-10-CM

## 2018-10-14 NOTE — Patient Instructions (Addendum)
Chelsea Burgess, I greatly value your feedback.  If you receive a survey following your visit with us today, we appreciate you taking the time to fill it out.  Thanks, [name], CNM, [facility]  [facility] It is now [facility] at [facility] ([address] [place], [place] [address]) Entrance located off of [address] Free 24/7 valet parking    Call the office [phone]) or go to [facility] if:  You begin to have strong, frequent contractions  Your water breaks.  Sometimes it is a big gush of fluid, sometimes it is just a trickle that keeps getting your panties wet or running down your legs  You have vaginal bleeding.  It is normal to have a small amount of spotting if your cervix was checked.   You don't feel your baby moving like normal.  If you don't, get you something to eat and drink and lay down and focus on feeling your baby move.  You should feel at least 10 movements in 2 hours.  If you don't, you should call the office or go to [facility].    Home Blood Pressure Monitoring for Patients   Your provider has recommended that you check your blood pressure (BP) at least once a week at home. If you do not have a blood pressure cuff at home, one will be provided for you. Contact your provider if you have not received your monitor within 1 week.   Helpful Tips for Accurate Home Blood Pressure Checks   Don't smoke, exercise, or drink caffeine 30 minutes before checking your BP  Use the restroom before checking your BP (a full bladder can raise your pressure)  Relax in a comfortable upright chair  Feet on the ground  Left arm resting comfortably on a flat surface at the level of your heart  Legs uncrossed  Back supported  Sit quietly and don't talk  Place the cuff on your bare arm  Adjust snuggly, so that only two fingertips can fit between your skin and the top of the cuff  Check 2 readings separated by at least one  minute  Keep a log of your BP readings  For a visual, please reference this diagram: [email or web address].care/bpdiagram  Provider Name: [facility]     Phone: [phone]  Zone 1: ALL CLEAR  Continue to monitor your symptoms:   BP reading is less than 140 (top number) or less than 90 (bottom number)   No right upper stomach pain  No headaches or seeing spots  No feeling nauseated or throwing up  No swelling in face and hands  Zone 2: CAUTION Call your doctor's office for any of the following:   BP reading is greater than 140 (top number) or greater than 90 (bottom number)   Stomach pain under your ribs in the middle or right side  Headaches or seeing spots  Feeling nauseated or throwing up  Swelling in face and hands  Zone 3: EMERGENCY  Seek immediate medical care if you have any of the following:   BP reading is greater than160 (top number) or greater than 110 (bottom number)  Severe headaches not improving with Tylenol  Serious difficulty catching your breath  Any worsening symptoms from Zone 2   AREA PEDIATRIC/FAMILY PRACTICE PHYSICIANS  [facility] [place] [address] [place], [place] [address] Phone - [phone]   Fax - [phone]  JACK AMOS [address]  [place], [place]  [address] Phone - [phone]   Fax - [phone]  [facility] [address] [place], [place]  [address] Phone - [phone]   Fax - [phone]  [facility] [address] [place], [place]  [address] Phone - [phone]   Fax - [phone]  [facility] [address] [place], [place]  [address] Phone - [phone]   Fax - [phone]  [facility] [address] [place], [place]  [address] Phone - [phone]   Fax - [phone]  [facility] [place] [address] [place], [place]  [address] Phone - [phone]   Fax -  [phone]  [facility] AT [facility] [address] [place] [place], [place]  [address] Phone - [phone]   Fax - [phone]  [facility] AT [facility] [address] [place], [place]  [address] Phone - [phone]   Fax - [phone] [facility] AT [place] [address] [place], [place]  [address] Phone - [phone]   Fax - [phone]  [facility] [address] [place], [place]  [address] Phone - [phone]   Fax - [phone]  [facility] [address] [place], [place]  [address] Phone - [phone]   Fax - [phone]  [facility] [place] [address] [place], [place]  [address] Phone - [phone]   Fax - [phone]  [facility] [address], [address] [place], [place]  [address] Phone - [phone]  [facility] [address] [place], [place]  [address] Phone - [phone]   Fax - [phone]  [facility] [address] [place], [place]  [address] Phone - [phone]   Fax - [phone]  [facility] [address] [place], [place]  [address] Phone - [phone]   Fax - [phone]  [facility] [address] [place], [place]  [address] Phone - [phone]   Fax - [phone]   [facility] [address] [place], [place]  [address] Phone - [phone]   Fax - [phone]  [facility] [address] [place], [place]  [address] Phone - [phone]   Fax - [phone]  DAVID RUBIN [address] [place], [place]  [address] Phone - [phone]   Fax - [phone]  [facility] [address] [place], [place]  [address] Phone - [phone]   Fax - [phone]  [place] [facility] Alita Chyle [address] [place], [place]   [address] Phone - [phone]   Fax - [phone] Gerarda Fraction [address] [place], [place]  [address] Phone - [phone]   Fax - [phone]  [facility] [address] [place], [place]  [address] Phone - [phone]   Fax - [phone]  [facility] - [place] [address] [place], [place]  [address] Phone - [phone]   Fax - [phone]     Preterm Labor and Birth Information  The normal length of a pregnancy is 39-41 weeks. Preterm labor is when labor starts before 37 completed weeks of pregnancy. What are the risk factors for preterm labor? Preterm labor is more likely to occur in women who:  Have certain infections during pregnancy such as a bladder infection, sexually transmitted infection, or infection inside the  uterus (chorioamnionitis).  Have a shorter-than-normal cervix.  Have gone into preterm labor before.  Have had surgery on their cervix.  Are younger than age 59 or older than age 19.  Are African American.  Are pregnant with twins or multiple babies (multiple gestation).  Take street drugs or smoke while pregnant.  Do not gain enough weight while pregnant.  Became pregnant shortly after having been pregnant. What are the symptoms of preterm labor? Symptoms of preterm labor include:  Cramps similar to those that can happen during a menstrual period. The cramps may happen with diarrhea.  Pain in the abdomen or lower back.  Regular uterine contractions that may feel like tightening of the abdomen.  A feeling of increased pressure in the pelvis.  Increased watery or bloody mucus discharge from the vagina.  Water breaking (ruptured amniotic sac). Why is it important to recognize signs of preterm labor? It is important to recognize signs of preterm labor because babies who are born prematurely may not be fully developed. This can put them at an increased risk for:  Long-term  (chronic) heart and lung problems.  Difficulty immediately after birth with regulating body systems, including blood sugar, body temperature, heart rate, and breathing rate.  Bleeding in the brain.  Cerebral palsy.  Learning difficulties.  Death. These risks are highest for babies who are born before 34 weeks of pregnancy. How is preterm labor treated? Treatment depends on the length of your pregnancy, your condition, and the health of your baby. It may involve:  Having a stitch (suture) placed in your cervix to prevent your cervix from opening too early (cerclage).  Taking or being given medicines, such as: ? Hormone medicines. These may be given early in pregnancy to help support the pregnancy. ? Medicine to stop contractions. ? Medicines to help mature the babys lungs. These may be prescribed if the risk of delivery is high. ? Medicines to prevent your baby from developing cerebral palsy. If the labor happens before 34 weeks of pregnancy, you may need to stay in the hospital. What should I do if I think I am in preterm labor? If you think that you are going into preterm labor, call your health care provider right away. How can I prevent preterm labor in future pregnancies? To increase your chance of having a full-term pregnancy:  Do not use any tobacco products, such as cigarettes, chewing tobacco, and e-cigarettes. If you need help quitting, ask your health care provider.  Do not use street drugs or medicines that have not been prescribed to you during your pregnancy.  Talk with your health care provider before taking any herbal supplements, even if you have been taking them regularly.  Make sure you gain a healthy amount of weight during your pregnancy.  Watch for infection. If you think that you might have an infection, get it checked right away.  Make sure to tell your health care provider if you have gone into preterm labor before. This information is not intended to  replace advice given to you by your health care provider. Make sure you discuss any questions you have with your health care provider. Document Released: 09/01/2003 Document Revised: 11/22/2015 Document Reviewed: 11/02/2015 Elsevier Interactive Patient Education  2019 [employer].  Coronavirus (COVID-19) Are you at risk?  Are you at risk for the Coronavirus (COVID-19)?  To be considered HIGH RISK for Coronavirus (COVID-19), you have to meet the following criteria:   Traveled to [place], [place], [place], [place] or  [place]; or in the [place] to [place], [place], [place], or [place]; and have fever, cough, and shortness of breath within the last 2 weeks of travel OR  Been in close contact with a person diagnosed with COVID-19 within the last 2 weeks and have fever, cough, and shortness of breath  IF YOU DO NOT MEET THESE CRITERIA, YOU ARE CONSIDERED LOW RISK FOR COVID-19.  What to do if you are HIGH RISK for COVID-19?   If you are having a medical emergency, call 911.  Seek medical care right away. Before you go to a doctors office, urgent care or emergency department, call ahead and tell them about your recent travel, contact with someone diagnosed with COVID-19, and your symptoms. You should receive instructions from your physicians office regarding next steps of care.   When you arrive at healthcare provider, tell the healthcare staff immediately you have returned from visiting [place], [place], [place], [place] or [place]; or traveled in the [place] to [place], [place], [place], or [place]; in the last two weeks or you have been in close contact with a person diagnosed with COVID-19 in the last 2 weeks.    Tell the health care staff about your symptoms: fever, cough and shortness of breath.  After you have been seen by a medical provider, you will be either: o Tested for (COVID-19) and discharged home on quarantine except to seek medical care if symptoms  worsen, and asked to  - Stay home and avoid contact with others until you get your results (4-5 days)  - Avoid travel on public transportation if possible (such as bus, train, or airplane) or o Sent to the Emergency Department by EMS for evaluation, COVID-19 testing, and possible admission depending on your condition and test results.  What to do if you are LOW RISK for COVID-19?  Reduce your risk of any infection by using the same precautions used for avoiding the common cold or flu:   Wash your hands often with soap and warm water for at least 20 seconds.  If soap and water are not readily available, use an alcohol-based hand sanitizer with at least 60% alcohol.   If coughing or sneezing, cover your mouth and nose by coughing or sneezing into the elbow areas of your shirt or coat, into a tissue or into your sleeve (not your hands).  Avoid shaking hands with others and consider head nods or verbal greetings only.  Avoid touching your eyes, nose, or mouth with unwashed hands.   Avoid close contact with people who are sick.  Avoid places or events with large numbers of people in one location, like concerts or sporting events.  Carefully consider travel plans you have or are making.  If you are planning any travel outside or inside the [place], visit the [facility] webpage for the latest health notices.  If you have some symptoms but not all symptoms, continue to monitor at home and seek medical attention if your symptoms worsen.  If you are having a medical emergency, call 911.   ADDITIONAL HEALTHCARE OPTIONS FOR PATIENTS  [facility] / e-Visit: [email or web address]         [facility]: [phone]  [facility]: [phone]                   [facility]: [phone]

## 2018-10-14 NOTE — Progress Notes (Signed)
   TELEHEALTH VIRTUAL OBSTETRICS VISIT ENCOUNTER NOTE Patient name: Chelsea Burgess MRN [id number]  Date of birth: 11-03-1995  I connected with patient on 10/14/18 at 10:00 AM EDT by [facility] and verified that I am speaking with the correct person using two identifiers. Due to COVID-19 recommendations, pt is not currently in our office.    I discussed the limitations, risks, security and privacy concerns of performing an evaluation and management service by telephone and the availability of in person appointments. I also discussed with the patient that there may be a patient responsible charge related to this service. The patient expressed understanding and agreed to proceed.  Chief Complaint:   Routine Prenatal Visit  History of Present Illness:   Chelsea Burgess is a 23 y.o. G2P0010 female at [redacted]w[redacted]d with an Estimated Date of Delivery: 11/26/18 being evaluated today for ongoing management of a low-risk pregnancy.  Today she reports no complaints. Contractions: Not present. Vag. Bleeding: None.  Movement: Present. denies leaking of fluid. Review of Systems:   Pertinent items are noted in HPI Denies abnormal vaginal discharge w/ itching/odor/irritation, headaches, visual changes, shortness of breath, chest pain, abdominal pain, severe nausea/vomiting, or problems with urination or bowel movements unless otherwise stated above. Pertinent History Reviewed:  Reviewed past medical,surgical, social, obstetrical and family history.  Reviewed problem list, medications and allergies. Physical Assessment:   Vitals:   10/14/18 0945  BP: 119/77  Pulse: 78  There is no height or weight on file to calculate BMI.        Physical Examination:   General:  Alert, oriented and cooperative.   Mental Status: Normal mood and affect perceived. Normal judgment and thought content.  Rest of physical exam deferred due to type of encounter  No results found for this or any previous visit (from the past 24  hour(s)).  Assessment & Plan:  1) Pregnancy G2P0010 at [redacted]w[redacted]d with an Estimated Date of Delivery: 11/26/18   Meds: No orders of the defined types were placed in this encounter.  Labs/procedures today: none  Plan:  Continue routine obstetrical care. Has home BP cuff. Check bp weekly, let us know if >140/90.   Reviewed: Preterm labor symptoms and general obstetric precautions including but not limited to vaginal bleeding, contractions, leaking of fluid and fetal movement were reviewed in detail with the patient. The patient was advised to call back or seek an in-person office evaluation/go to MAU at [facility] for any urgent or concerning symptoms. All questions were answered. Please refer to After Visit Summary for other counseling recommendations.   I provided 8 minutes of face-to-face time during this encounter.  Follow-up: Return in about 2 weeks (around 10/28/2018) for LROB Webex.  No orders of the defined types were placed in this encounter.  Cheral Marker CNM, [facility] 10/14/2018 10:01 AM

## 2018-10-15 ENCOUNTER — Encounter: Payer: Medicaid Other | Admitting: Obstetrics and Gynecology

## 2018-10-27 ENCOUNTER — Encounter: Payer: Self-pay | Admitting: *Deleted

## 2018-10-28 ENCOUNTER — Ambulatory Visit (INDEPENDENT_AMBULATORY_CARE_PROVIDER_SITE_OTHER): Payer: Medicaid Other | Admitting: Women's Health

## 2018-10-28 ENCOUNTER — Other Ambulatory Visit: Payer: Self-pay

## 2018-10-28 ENCOUNTER — Encounter: Payer: Self-pay | Admitting: Women's Health

## 2018-10-28 VITALS — BP 120/73 | HR 71

## 2018-10-28 DIAGNOSIS — Z3A35 35 weeks gestation of pregnancy: Secondary | ICD-10-CM

## 2018-10-28 DIAGNOSIS — Z3483 Encounter for supervision of other normal pregnancy, third trimester: Secondary | ICD-10-CM

## 2018-10-28 NOTE — Progress Notes (Signed)
   TELEHEALTH VIRTUAL OBSTETRICS VISIT ENCOUNTER NOTE Patient name: Chelsea Burgess MRN [id number]  Date of birth: [birth year]  I connected with patient on 10/28/18 at 10:30 AM EDT by [facility] and verified that I am speaking with the correct person using two identifiers. Due to COVID-19 recommendations, pt is not currently in our office.    I discussed the limitations, risks, security and privacy concerns of performing an evaluation and management service by telephone and the availability of in person appointments. I also discussed with the patient that there may be a patient responsible charge related to this service. The patient expressed understanding and agreed to proceed.  Chief Complaint:   Routine Prenatal Visit  History of Present Illness:   Chelsea Burgess is a 23 y.o. G2P0010 female at [redacted]w[redacted]d with an Estimated Date of Delivery: 11/26/18 being evaluated today for ongoing management of a low-risk pregnancy.  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  May be interested in cutting back hours. Contractions: Not present. Vag. Bleeding: None.  Movement: Present. denies leaking of fluid. Review of Systems:   Pertinent items are noted in HPI Denies abnormal vaginal discharge w/ itching/odor/irritation, headaches, visual changes, shortness of breath, chest pain, abdominal pain, severe nausea/vomiting, or problems with urination or bowel movements unless otherwise stated above. Pertinent History Reviewed:  Reviewed past medical,surgical, social, obstetrical and family history.  Reviewed problem list, medications and allergies. Physical Assessment:   Vitals:   10/28/18 1036  BP: 120/73  Pulse: 71  There is no height or weight on file to calculate BMI.        Physical Examination:   General:  Alert, oriented and cooperative.   Mental Status: Normal mood and affect perceived. Normal judgment and thought  content.  Rest of physical exam deferred due to type of encounter  No results found for this or any previous visit (from the past 24 hour(s)).  Assessment & Plan:  1) Pregnancy G2P0010 at [redacted]w[redacted]d with an Estimated Date of Delivery: 11/26/18    Meds: No orders of the defined types were placed in this encounter.  La/procedures today: none  Plan:  Continue routine obstetrical care. Has BP cuff. Check bp weekly, let us know if >140/90.   Reviewed: Preterm labor symptoms and general obstetric precautions including but not limited to vaginal bleeding, contractions, leaking of fluid and fetal movement were reviewed in detail with the patient. The patient was advised to call back or seek an in-person office evaluation/go to MAU at [facility] for any urgent or concerning symptoms. All questions were answered. Please refer to After Visit Summary for other counseling recommendations.   I provided 10 minutes of non-face-to-face time during this encounter.  Follow-up: Return in about 1 week (around 11/04/2018) for LROB in office, gbs.  No orders of the defined types were placed in this encounter.  Cheral Marker CNM, [facility] 10/28/2018 10:53 AM

## 2018-10-28 NOTE — Patient Instructions (Signed)
Chelsea Burgess, I greatly value your feedback.  If you receive a survey following your visit with [place] today, we appreciate you taking the time to fill it out.  Thanks, Chelsea Burgess, CNM, [facility]  [facility] HAS MOVED!!! It is now [facility] ([address] [place], [place] [address]) Entrance located off of [address] Free 24/7 valet parking   Home Blood Pressure Monitoring for Patients   Your provider has recommended that you check your blood pressure (BP) at least once a week at home. If you do not have a blood pressure cuff at home, one will be provided for you. Contact your provider if you have not received your monitor within 1 week.   Helpful Tips for Accurate Home Blood Pressure Checks  . Don't smoke, exercise, or drink caffeine 30 minutes before checking your BP . Use the restroom before checking your BP (a full bladder can raise your pressure) . Relax in a comfortable upright chair . Feet on the ground . Left arm resting comfortably on a flat surface at the level of your heart . Legs uncrossed . Back supported . Sit quietly and don't talk . Place the cuff on your bare arm . Adjust snuggly, so that only two fingertips can fit between your skin and the top of the cuff . Check 2 readings separated by at least one minute . Keep a log of your BP readings . For a visual, please reference this diagram: http://ccnc.care/bpdiagram  Provider Name: [facility]     Phone: [phone]  Zone 1: ALL CLEAR  Continue to monitor your symptoms:  . BP reading is less than 140 (top number) or less than 90 (bottom number)  . No right upper stomach pain . No headaches or seeing spots . No feeling nauseated or throwing up . No swelling in face and hands  Zone 2: CAUTION Call your doctor's office for any of the following:  . BP reading is greater than 140 (top number) or greater than 90 (bottom number)  . Stomach pain under your ribs in the  middle or right side . Headaches or seeing spots . Feeling nauseated or throwing up . Swelling in face and hands  Zone 3: EMERGENCY  Seek immediate medical care if you have any of the following:  . BP reading is greater than160 (top number) or greater than 110 (bottom number) . Severe headaches not improving with Tylenol . Serious difficulty catching your breath . Any worsening symptoms from Zone 2    Call the office [phone]) or go to [facility] if:  You begin to have strong, frequent contractions  Your water breaks.  Sometimes it is a big gush of fluid, sometimes it is just a trickle that keeps getting your panties wet or running down your legs  You have vaginal bleeding.  It is normal to have a small amount of spotting if your cervix was checked.   You don't feel your baby moving like normal.  If you don't, get you something to eat and drink and lay down and focus on feeling your baby move.  You should feel at least 10 movements in 2 hours.  If you don't, you should call the office or go to [facility].    Preterm Labor and Birth Information  The normal length of a pregnancy is 39-41 weeks. Preterm labor is when labor starts before 37 completed weeks of pregnancy. What are the risk factors for  preterm labor? Preterm labor is more likely to occur in women who:  Have certain infections during pregnancy such as a bladder infection, sexually transmitted infection, or infection inside the uterus (chorioamnionitis).  Have a shorter-than-normal cervix.  Have gone into preterm labor before.  Have had surgery on their cervix.  Are younger than age 23 or older than age 23.  Are African American.  Are pregnant with twins or multiple babies (multiple gestation).  Take street drugs or smoke while pregnant.  Do not gain enough weight while pregnant.  Became pregnant shortly after having been pregnant. What are the symptoms of preterm labor? Symptoms of preterm labor  include:  Cramps similar to those that can happen during a menstrual period. The cramps may happen with diarrhea.  Pain in the abdomen or lower back.  Regular uterine contractions that may feel like tightening of the abdomen.  A feeling of increased pressure in the pelvis.  Increased watery or bloody mucus discharge from the vagina.  Water breaking (ruptured amniotic sac). Why is it important to recognize signs of preterm labor? It is important to recognize signs of preterm labor because babies who are born prematurely may not be fully developed. This can put them at an increased risk for:  Long-term (chronic) heart and lung problems.  Difficulty immediately after birth with regulating body systems, including blood sugar, body temperature, heart rate, and breathing rate.  Bleeding in the brain.  Cerebral palsy.  Learning difficulties.  Death. These risks are highest for babies who are born before 34 weeks of pregnancy. How is preterm labor treated? Treatment depends on the length of your pregnancy, your condition, and the health of your baby. It may involve:  Having a stitch (suture) placed in your cervix to prevent your cervix from opening too early (cerclage).  Taking or being given medicines, such as: ? Hormone medicines. These may be given early in pregnancy to help support the pregnancy. ? Medicine to stop contractions. ? Medicines to help mature the baby's lungs. These may be prescribed if the risk of delivery is high. ? Medicines to prevent your baby from developing cerebral palsy. If the labor happens before 34 weeks of pregnancy, you may need to stay in the hospital. What should I do if I think I am in preterm labor? If you think that you are going into preterm labor, call your health care provider right away. How can I prevent preterm labor in future pregnancies? To increase your chance of having a full-term pregnancy:  Do not use any tobacco products, such as  cigarettes, chewing tobacco, and e-cigarettes. If you need help quitting, ask your health care provider.  Do not use street drugs or medicines that have not been prescribed to you during your pregnancy.  Talk with your health care provider before taking any herbal supplements, even if you have been taking them regularly.  Make sure you gain a healthy amount of weight during your pregnancy.  Watch for infection. If you think that you might have an infection, get it checked right away.  Make sure to tell your health care provider if you have gone into preterm labor before. This information is not intended to replace advice given to you by your health care provider. Make sure you discuss any questions you have with your health care provider. Document Released: 09/01/2003 Document Revised: 11/22/2015 Document Reviewed: 11/02/2015 Elsevier Interactive Patient Education  2019 [employer]  Coronavirus (COVID-19) Are you at risk?  Are you at risk  for the Coronavirus (COVID-19)?  To be considered HIGH RISK for Coronavirus (COVID-19), you have to meet the following criteria:  . Traveled to [place], [place], [place], [place] or [place]; or in the [place] to [place], [place], [place], or [place]; and have fever, cough, and shortness of breath within the last 2 weeks of travel OR . Been in close contact with a person diagnosed with COVID-19 within the last 2 weeks and have fever, cough, and shortness of breath . IF YOU DO NOT MEET THESE CRITERIA, YOU ARE CONSIDERED LOW RISK FOR COVID-19.  What to do if you are HIGH RISK for COVID-19?  Marland Kitchen If you are having a medical emergency, call 911. . Seek medical care right away. Before you go to a doctor's office, urgent care or emergency department, call ahead and tell them about your recent travel, contact with someone diagnosed with COVID-19, and your symptoms. You should receive instructions from your physician's office regarding next steps of  care.  . When you arrive at healthcare provider, tell the healthcare staff immediately you have returned from visiting [place], [place], [place], [place] or [place]; or traveled in the [place] to [place], [place], [place], or [place]; in the last two weeks or you have been in close contact with a person diagnosed with COVID-19 in the last 2 weeks.   . Tell the health care staff about your symptoms: fever, cough and shortness of breath. . After you have been seen by a medical provider, you will be either: o Tested for (COVID-19) and discharged home on quarantine except to seek medical care if symptoms worsen, and asked to  - Stay home and avoid contact with others until you get your results (4-5 days)  - Avoid travel on public transportation if possible (such as bus, train, or airplane) or o Sent to the Emergency Department by EMS for evaluation, COVID-19 testing, and possible admission depending on your condition and test results.  What to do if you are LOW RISK for COVID-19?  Reduce your risk of any infection by using the same precautions used for avoiding the common cold or flu:  Marland Kitchen Wash your hands often with soap and warm water for at least 20 seconds.  If soap and water are not readily available, use an alcohol-based hand sanitizer with at least 60% alcohol.  . If coughing or sneezing, cover your mouth and nose by coughing or sneezing into the elbow areas of your shirt or coat, into a tissue or into your sleeve (not your hands). . Avoid shaking hands with others and consider head nods or verbal greetings only. . Avoid touching your eyes, nose, or mouth with unwashed hands.  . Avoid close contact with people who are sick. . Avoid places or events with large numbers of people in one location, like concerts or sporting events. . Carefully consider travel plans you have or are making. . If you are planning any travel outside or inside the [place], visit the CDC's Travelers' Health webpage  for the latest health notices. . If you have some symptoms but not all symptoms, continue to monitor at home and seek medical attention if your symptoms worsen. . If you are having a medical emergency, call 911.   ADDITIONAL HEALTHCARE OPTIONS FOR PATIENTS  [facility] / e-Visit: [email or web address]         [facility]: [phone]  [facility]: [phone]  [facility]: [phone]

## 2018-11-03 ENCOUNTER — Encounter: Payer: Self-pay | Admitting: *Deleted

## 2018-11-04 ENCOUNTER — Ambulatory Visit (INDEPENDENT_AMBULATORY_CARE_PROVIDER_SITE_OTHER): Payer: Medicaid Other | Admitting: Obstetrics & Gynecology

## 2018-11-04 ENCOUNTER — Encounter: Payer: Self-pay | Admitting: Obstetrics & Gynecology

## 2018-11-04 ENCOUNTER — Other Ambulatory Visit: Payer: Self-pay

## 2018-11-04 VITALS — BP 93/65 | HR 90 | Temp 98.3°F | Wt 141.8 lb

## 2018-11-04 DIAGNOSIS — Z331 Pregnant state, incidental: Secondary | ICD-10-CM

## 2018-11-04 DIAGNOSIS — Z3483 Encounter for supervision of other normal pregnancy, third trimester: Secondary | ICD-10-CM

## 2018-11-04 DIAGNOSIS — Z1389 Encounter for screening for other disorder: Secondary | ICD-10-CM

## 2018-11-04 DIAGNOSIS — Z3A36 36 weeks gestation of pregnancy: Secondary | ICD-10-CM

## 2018-11-04 LAB — POCT URINALYSIS DIPSTICK OB
Blood, UA: NEGATIVE
Glucose, UA: NEGATIVE
Ketones, UA: NEGATIVE
Nitrite, UA: NEGATIVE
POC,PROTEIN,UA: NEGATIVE

## 2018-11-04 NOTE — Progress Notes (Signed)
   LOW-RISK PREGNANCY VISIT Patient name: Chelsea Burgess MRN [id number]  Date of birth: 04-27-1996 Chief Complaint:   Routine Prenatal Visit (gbs-gc-chl)  History of Present Illness:   Chelsea Burgess is a 23 y.o. G20P0010 female at [redacted]w[redacted]d with an Estimated Date of Delivery: 11/26/18 being seen today for ongoing management of a low-risk pregnancy.  Today she reports no complaints. Contractions: Irregular.  .  Movement: Present. denies leaking of fluid. Review of Systems:   Pertinent items are noted in HPI Denies abnormal vaginal discharge w/ itching/odor/irritation, headaches, visual changes, shortness of breath, chest pain, abdominal pain, severe nausea/vomiting, or problems with urination or bowel movements unless otherwise stated above. Pertinent History Reviewed:  Reviewed past medical,surgical, social, obstetrical and family history.  Reviewed problem list, medications and allergies. Physical Assessment:   Vitals:   11/04/18 1233  BP: 93/65  Pulse: 90  Temp: 98.3 F (36.8 C)  Weight: 141 lb 12.8 oz (64.3 kg)  Body mass index is 22.21 kg/m.        Physical Examination:   General appearance: Well appearing, and in no distress  Mental status: Alert, oriented to person, place, and time  Skin: Warm & dry  Cardiovascular: Normal heart rate noted  Respiratory: Normal respiratory effort, no distress  Abdomen: Soft, gravid, nontender  Pelvic: Cervical exam performed  Dilation: Closed Effacement (%): Thick Station: -3  Extremities: Edema: None  Fetal Status: Fetal Heart Rate (bpm): 145 Fundal Height: 33 cm Movement: Present Presentation: Vertex  Results for orders placed or performed in visit on 11/04/18 (from the past 24 hour(s))  POC Urinalysis Dipstick OB   Collection Time: 11/04/18 12:36 PM  Result Value Ref Range   Color, UA     Clarity, UA     Glucose, UA Negative Negative   Bilirubin, UA     Ketones, UA neg    Spec Grav, UA     Blood, UA neg    pH, UA     POC,PROTEIN,UA Negative Negative, Trace, Small (1+), Moderate (2+), Large (3+), 4+   Urobilinogen, UA     Nitrite, UA neg    Leukocytes, UA Moderate (2+) (A) Negative   Appearance     Odor      Assessment & Plan:  1) Low-risk pregnancy G2P0010 at [redacted]w[redacted]d with an Estimated Date of Delivery: 11/26/18   2) cultures done today   Meds: No orders of the defined types were placed in this encounter.  Labs/procedures today: cultures  Plan:  Continue routine obstetrical care   Reviewed: Term labor symptoms and general obstetric precautions including but not limited to vaginal bleeding, contractions, leaking of fluid and fetal movement were reviewed in detail with the patient.  All questions were answered  Follow-up: Return in about 1 week (around 11/11/2018) for webex, LROB, 2 weeks in office visit.  Orders Placed This Encounter  Procedures  . Strep Gp B NAA  . GC/Chlamydia Probe Amp  . POC Urinalysis Dipstick OB   Lazaro Arms  11/04/2018 12:50 PM

## 2018-11-06 LAB — STREP GP B NAA: Strep Gp B NAA: POSITIVE — AB

## 2018-11-08 LAB — GC/CHLAMYDIA PROBE AMP
Chlamydia trachomatis, NAA: NEGATIVE
Neisseria Gonorrhoeae by PCR: NEGATIVE

## 2018-11-10 ENCOUNTER — Encounter: Payer: Self-pay | Admitting: *Deleted

## 2018-11-11 ENCOUNTER — Encounter: Payer: Self-pay | Admitting: Women's Health

## 2018-11-11 ENCOUNTER — Other Ambulatory Visit: Payer: Self-pay

## 2018-11-11 ENCOUNTER — Ambulatory Visit (INDEPENDENT_AMBULATORY_CARE_PROVIDER_SITE_OTHER): Payer: Medicaid Other | Admitting: Women's Health

## 2018-11-11 VITALS — BP 120/71 | HR 72

## 2018-11-11 DIAGNOSIS — Z3A37 37 weeks gestation of pregnancy: Secondary | ICD-10-CM | POA: Diagnosis not present

## 2018-11-11 DIAGNOSIS — Z3483 Encounter for supervision of other normal pregnancy, third trimester: Secondary | ICD-10-CM

## 2018-11-11 NOTE — Progress Notes (Signed)
   TELEHEALTH VIRTUAL OBSTETRICS VISIT ENCOUNTER NOTE Patient name: Chelsea Burgess MRN [id number]  Date of birth: 1995/10/24  I connected with patient on 11/11/18 at  9:45 AM EDT by [facility] and verified that I am speaking with the correct person using two identifiers. Due to COVID-19 recommendations, pt is not currently in our office.    I discussed the limitations, risks, security and privacy concerns of performing an evaluation and management service by telephone and the availability of in person appointments. I also discussed with the patient that there may be a patient responsible charge related to this service. The patient expressed understanding and agreed to proceed.  Chief Complaint:   Routine Prenatal Visit  History of Present Illness:   Chelsea Burgess is a 23 y.o. G9P0010 female [redacted]w[redacted]d with an Estimated Date of Delivery: 11/26/18 being evaluated today for ongoing management of a low-risk pregnancy.  Today she reports cramping at night. Contractions: Irritability. Vag. Bleeding: None.  Movement: Present. denies leaking of fluid. Review of Systems:   Pertinent items are noted in HPI Denies abnormal vaginal discharge w/ itching/odor/irritation, headaches, visual changes, shortness of breath, chest pain, abdominal pain, severe nausea/vomiting, or problems with urination or bowel movements unless otherwise stated above. Pertinent History Reviewed:  Reviewed past medical,surgical, social, obstetrical and family history.  Reviewed problem list, medications and allergies. Physical Assessment:   Vitals:   11/11/18 1008  BP: 120/71  Pulse: 72  There is no height or weight on file to calculate BMI.        Physical Examination:   General:  Alert, oriented and cooperative.   Mental Status: Normal mood and affect perceived. Normal judgment and thought content.  Rest of physical exam deferred due to type of encounter  No results found for this or any previous visit (from the past 24  hour(s)).  Assessment & Plan:  1) Pregnancy G2P0010 [redacted]w[redacted]d with an Estimated Date of Delivery: 11/26/18    Meds: No orders of the defined types were placed in this encounter.   Labs/procedures today: none  Plan:  Continue routine obstetrical care. Has BP cuff. Check bp weekly, let us know if >140/90.   Reviewed: Term labor symptoms and general obstetric precautions including but not limited to vaginal bleeding, contractions, leaking of fluid and fetal movement were reviewed in detail with the patient. The patient was advised to call back or seek an in-person office evaluation/go to MAU at [facility] for any urgent or concerning symptoms. All questions were answered. Please refer to After Visit Summary for other counseling recommendations.   I provided 10 minutes of non-face-to-face time during this encounter.  Follow-up: Return in about 1 week (around 11/18/2018) for LROB webex.  No orders of the defined types were placed in this encounter.  Cheral Marker CNM, [facility] 11/11/2018 10:17 AM

## 2018-11-11 NOTE — Patient Instructions (Signed)
Chelsea Burgess, I greatly value your feedback.  If you receive a survey following your visit with [place] today, we appreciate you taking the time to fill it out.  Thanks, [name], CNM, [facility]  [facility] HAS MOVED!!! It is now [facility] ([address] [place], [place] [address]) Entrance located off of [address] Free 24/7 valet parking   Home Blood Pressure Monitoring for Patients   Your provider has recommended that you check your blood pressure (BP) at least once a week at home. If you do not have a blood pressure cuff at home, one will be provided for you. Contact your provider if you have not received your monitor within 1 week.   Helpful Tips for Accurate Home Blood Pressure Checks  . Don't smoke, exercise, or drink caffeine 30 minutes before checking your BP . Use the restroom before checking your BP (a full bladder can raise your pressure) . Relax in a comfortable upright chair . Feet on the ground . Left arm resting comfortably on a flat surface at the level of your heart . Legs uncrossed . Back supported . Sit quietly and don't talk . Place the cuff on your bare arm . Adjust snuggly, so that only two fingertips can fit between your skin and the top of the cuff . Check 2 readings separated by at least one minute . Keep a log of your BP readings . For a visual, please reference this diagram: http://ccnc.care/bpdiagram  Provider Name: [facility]     Phone: [phone]  Zone 1: ALL CLEAR  Continue to monitor your symptoms:  . BP reading is less than 140 (top number) or less than 90 (bottom number)  . No right upper stomach pain . No headaches or seeing spots . No feeling nauseated or throwing up . No swelling in face and hands  Zone 2: CAUTION Call your doctor's office for any of the following:  . BP reading is greater than 140 (top number) or greater than 90 (bottom number)  . Stomach pain under your ribs in the  middle or right side . Headaches or seeing spots . Feeling nauseated or throwing up . Swelling in face and hands  Zone 3: EMERGENCY  Seek immediate medical care if you have any of the following:  . BP reading is greater than160 (top number) or greater than 110 (bottom number) . Severe headaches not improving with Tylenol . Serious difficulty catching your breath . Any worsening symptoms from Zone 2     Call the office [phone]) or go to [facility] if:  You begin to have strong, frequent contractions  Your water breaks.  Sometimes it is a big gush of fluid, sometimes it is just a trickle that keeps getting your panties wet or running down your legs  You have vaginal bleeding.  It is normal to have a small amount of spotting if your cervix was checked.   You don't feel your baby moving like normal.  If you don't, get you something to eat and drink and lay down and focus on feeling your baby move.  You should feel at least 10 movements in 2 hours.  If you don't, you should call the office or go to [facility].   [facility] Contractions Contractions of the uterus can occur throughout pregnancy, but they are not always a sign that you are in labor. You may have practice contractions called Braxton Hicks contractions. These  false labor contractions are sometimes confused with true labor. What are Deberah PeltonBraxton Hicks contractions? Braxton Hicks contractions are tightening movements that occur in the muscles of the uterus before labor. Unlike true labor contractions, these contractions do not result in opening (dilation) and thinning of the cervix. Toward the end of pregnancy (32-34 weeks), Braxton Hicks contractions can happen more often and may become stronger. These contractions are sometimes difficult to tell apart from true labor because they can be very uncomfortable. You should not feel embarrassed if you go to the hospital with false labor. Sometimes, the only way to tell if you  are in true labor is for your health care provider to look for changes in the cervix. The health care provider will do a physical exam and may monitor your contractions. If you are not in true labor, the exam should show that your cervix is not dilating and your water has not broken. If there are no other health problems associated with your pregnancy, it is completely safe for you to be sent home with false labor. You may continue to have Braxton Hicks contractions until you go into true labor. How to tell the difference between true labor and false labor True labor Contractions last 30-70 seconds. Contractions become very regular. Discomfort is usually felt in the top of the uterus, and it spreads to the lower abdomen and low back. Contractions do not go away with walking. Contractions usually become more intense and increase in frequency. The cervix dilates and gets thinner. False labor Contractions are usually shorter and not as strong as true labor contractions. Contractions are usually irregular. Contractions are often felt in the front of the lower abdomen and in the groin. Contractions may go away when you walk around or change positions while lying down. Contractions get weaker and are shorter-lasting as time goes on. The cervix usually does not dilate or become thin. Follow these instructions at home:  Take over-the-counter and prescription medicines only as told by your health care provider. Keep up with your usual exercises and follow other instructions from your health care provider. Eat and drink lightly if you think you are going into labor. If Braxton Hicks contractions are making you uncomfortable: Change your position from lying down or resting to walking, or change from walking to resting. Sit and rest in a tub of warm water. Drink enough fluid to keep your urine pale yellow. Dehydration may cause these contractions. Do slow and deep breathing several times an hour. Keep  all follow-up prenatal visits as told by your health care provider. This is important. Contact a health care provider if: You have a fever. You have continuous pain in your abdomen. Get help right away if: Your contractions become stronger, more regular, and closer together. You have fluid leaking or gushing from your vagina. You pass blood-tinged mucus (bloody show). You have bleeding from your vagina. You have low back pain that you never had before. You feel your baby's head pushing down and causing pelvic pressure. Your baby is not moving inside you as much as it used to. Summary Contractions that occur before labor are called Braxton Hicks contractions, false labor, or practice contractions. Braxton Hicks contractions are usually shorter, weaker, farther apart, and less regular than true labor contractions. True labor contractions usually become progressively stronger and regular, and they become more frequent. Manage discomfort from [facility] contractions by changing position, resting in a warm bath, drinking plenty of water, or practicing deep breathing. This information  is not intended to replace advice given to you by your health care provider. Make sure you discuss any questions you have with your health care provider. Document Released: 10/25/2016 Document Revised: 03/26/2017 Document Reviewed: 10/25/2016 Elsevier Interactive Patient Education  2019 [employer].  Coronavirus (COVID-19) Are you at risk?  Are you at risk for the Coronavirus (COVID-19)?  To be considered HIGH RISK for Coronavirus (COVID-19), you have to meet the following criteria:  . Traveled to [place], [place], [place], [place] or [place]; or in the [place] to [place], [place], [place], or [place]; and have fever, cough, and shortness of breath within the last 2 weeks of travel OR . Been in close contact with a person diagnosed with COVID-19 within the last 2 weeks and have fever, cough, and  shortness of breath . IF YOU DO NOT MEET THESE CRITERIA, YOU ARE CONSIDERED LOW RISK FOR COVID-19.  What to do if you are HIGH RISK for COVID-19?  Marland Kitchen If you are having a medical emergency, call 911. . Seek medical care right away. Before you go to a doctor's office, urgent care or emergency department, call ahead and tell them about your recent travel, contact with someone diagnosed with COVID-19, and your symptoms. You should receive instructions from your physician's office regarding next steps of care.  . When you arrive at healthcare provider, tell the healthcare staff immediately you have returned from visiting [place], [place], [place], [place] or [place]; or traveled in the [place] to [place], [place], [place], or [place]; in the last two weeks or you have been in close contact with a person diagnosed with COVID-19 in the last 2 weeks.   . Tell the health care staff about your symptoms: fever, cough and shortness of breath. . After you have been seen by a medical provider, you will be either: o Tested for (COVID-19) and discharged home on quarantine except to seek medical care if symptoms worsen, and asked to  - Stay home and avoid contact with others until you get your results (4-5 days)  - Avoid travel on public transportation if possible (such as bus, train, or airplane) or o Sent to the Emergency Department by EMS for evaluation, COVID-19 testing, and possible admission depending on your condition and test results.  What to do if you are LOW RISK for COVID-19?  Reduce your risk of any infection by using the same precautions used for avoiding the common cold or flu:  Marland Kitchen Wash your hands often with soap and warm water for at least 20 seconds.  If soap and water are not readily available, use an alcohol-based hand sanitizer with at least 60% alcohol.  . If coughing or sneezing, cover your mouth and nose by coughing or sneezing into the elbow areas of your shirt or coat, into a  tissue or into your sleeve (not your hands). . Avoid shaking hands with others and consider head nods or verbal greetings only. . Avoid touching your eyes, nose, or mouth with unwashed hands.  . Avoid close contact with people who are sick. . Avoid places or events with large numbers of people in one location, like concerts or sporting events. . Carefully consider travel plans you have or are making. . If you are planning any travel outside or inside the [place], visit the CDC's Travelers' Health webpage for the latest health notices. . If you have some symptoms but not all symptoms, continue to monitor at home and seek  medical attention if your symptoms worsen. . If you are having a medical emergency, call 911.   ADDITIONAL HEALTHCARE OPTIONS FOR PATIENTS  [facility] / e-Visit: [email or web address]         [facility]: [phone]  [facility]: [phone]                   [facility]: [phone]

## 2018-11-18 ENCOUNTER — Encounter: Payer: Medicaid Other | Admitting: Women's Health

## 2018-11-19 ENCOUNTER — Encounter: Payer: Self-pay | Admitting: Women's Health

## 2018-11-19 ENCOUNTER — Other Ambulatory Visit: Payer: Self-pay

## 2018-11-19 ENCOUNTER — Ambulatory Visit (INDEPENDENT_AMBULATORY_CARE_PROVIDER_SITE_OTHER): Payer: Medicaid Other | Admitting: Women's Health

## 2018-11-19 VITALS — BP 110/81 | HR 76

## 2018-11-19 DIAGNOSIS — Z3A39 39 weeks gestation of pregnancy: Secondary | ICD-10-CM

## 2018-11-19 DIAGNOSIS — Z3483 Encounter for supervision of other normal pregnancy, third trimester: Secondary | ICD-10-CM

## 2018-11-19 DIAGNOSIS — O480 Post-term pregnancy: Secondary | ICD-10-CM | POA: Diagnosis not present

## 2018-11-19 NOTE — Progress Notes (Signed)
   TELEHEALTH VIRTUAL OBSTETRICS VISIT ENCOUNTER NOTE Patient name: Chelsea Burgess MRN [id number]  Date of birth: 03/17/1996  I connected with patient on 11/19/18 at  2:45 PM EDT by [facility] and verified that I am speaking with the correct person using two identifiers. Due to COVID-19 recommendations, pt is not currently in our office.    I discussed the limitations, risks, security and privacy concerns of performing an evaluation and management service by telephone and the availability of in person appointments. I also discussed with the patient that there may be a patient responsible charge related to this service. The patient expressed understanding and agreed to proceed.  Chief Complaint:   Routine Prenatal Visit  History of Present Illness:   Chelsea Burgess is a 23 y.o. G2P0010 female at [redacted]w[redacted]d with an Estimated Date of Delivery: 11/26/18 being evaluated today for ongoing management of a low-risk pregnancy.  Today she reports no complaints. Contractions: Irritability. Vag. Bleeding: None.  Movement: Present. denies leaking of fluid. Review of Systems:   Pertinent items are noted in HPI Denies abnormal vaginal discharge w/ itching/odor/irritation, headaches, visual changes, shortness of breath, chest pain, abdominal pain, severe nausea/vomiting, or problems with urination or bowel movements unless otherwise stated above. Pertinent History Reviewed:  Reviewed past medical,surgical, social, obstetrical and family history.  Reviewed problem list, medications and allergies. Physical Assessment:   Vitals:   11/19/18 1422  BP: 110/81  Pulse: 76  There is no height or weight on file to calculate BMI.        Physical Examination:   General:  Alert, oriented and cooperative.   Mental Status: Normal mood and affect perceived. Normal judgment and thought content.  Rest of physical exam deferred due to type of encounter  No results found for this or any previous visit (from the past 24  hour(s)).  Assessment & Plan:  1) Pregnancy G2P0010 at [redacted]w[redacted]d with an Estimated Date of Delivery: 11/26/18    Meds: No orders of the defined types were placed in this encounter.  Labs/procedures today: none  Plan:  Continue routine obstetrical care. Has BP cuff. Check bp weekly, let us know if >140/90.   Reviewed: Term labor symptoms and general obstetric precautions including but not limited to vaginal bleeding, contractions, leaking of fluid and fetal movement were reviewed in detail with the patient. The patient was advised to call back or seek an in-person office evaluation/go to [facility] at [facility] for any urgent or concerning symptoms. All questions were answered. Please refer to After Visit Summary for other counseling recommendations.   I provided 10 minutes of non-face-to-face time during this encounter.  Follow-up: Return in about 1 week (around 11/26/2018) for LROB, US:BPP.  Orders Placed This Encounter  Procedures  . US FETAL BPP WO NON STRESS   Cheral Marker CNM, [facility] 11/19/2018 2:36 PM

## 2018-11-19 NOTE — Patient Instructions (Signed)
[name], I greatly value your feedback.  If you receive a survey following your visit with [place] today, we appreciate you taking the time to fill it out.  Thanks, [name], CNM, [facility]  [facility]OSP[facility] HAS MOVED!!! It is now [facility] ([address] [place], [place] [address]) Entrance located off of [address] Free 24/7 valet parking    Call the office [phone]) or go to [facility] if:  You begin to have strong, frequent contractions  Your water breaks.  Sometimes it is a big gush of fluid, sometimes it is just a trickle that keeps getting your panties wet or running down your legs  You have vaginal bleeding.  It is normal to have a small amount of spotting if your cervix was checked.   You don't feel your baby moving like normal.  If you don't, get you something to eat and drink and lay down and focus on feeling your baby move.  You should feel at least 10 movements in 2 hours.  If you don't, you should call the office or go to [facility].    Home Blood Pressure Monitoring for Patients   Your provider has recommended that you check your blood pressure (BP) at least once a week at home. If you do not have a blood pressure cuff at home, one will be provided for you. Contact your provider if you have not received your monitor within 1 week.   Helpful Tips for Accurate Home Blood Pressure Checks   Don't smoke, exercise, or drink caffeine 30 minutes before checking your BP  Use the restroom before checking your BP (a full bladder can raise your pressure)  Relax in a comfortable upright chair  Feet on the ground  Left arm resting comfortably on a flat surface at the level of your heart  Legs uncrossed  Back supported  Sit quietly and don't talk  Place the cuff on your bare arm  Adjust snuggly, so that only two fingertips can fit between your skin and the top of the cuff  Check 2 readings separated by at least one  minute  Keep a log of your BP readings  For a visual, please reference this diagram: http://ccnc.care/bpdiagram  Provider Name: [facility]     Phone: [phone]  Zone 1: ALL CLEAR  Continue to monitor your symptoms:   BP reading is less than 140 (top number) or less than 90 (bottom number)   No right upper stomach pain  No headaches or seeing spots  No feeling nauseated or throwing up  No swelling in face and hands  Zone 2: CAUTION Call your doctor's office for any of the following:   BP reading is greater than 140 (top number) or greater than 90 (bottom number)   Stomach pain under your ribs in the middle or right side  Headaches or seeing spots  Feeling nauseated or throwing up  Swelling in face and hands  Zone 3: EMERGENCY  Seek immediate medical care if you have any of the following:   BP reading is greater than160 (top number) or greater than 110 (bottom number)  Severe headaches not improving with Tylenol  Serious difficulty catching your breath  Any worsening symptoms from Zone 2   Braxton Hicks Contractions Contractions of the uterus can occur throughout pregnancy, but they are not always a sign that you are in labor. You may have practice contractions called Braxton Hicks contractions. These  false labor contractions are sometimes confused with true labor. What are Deberah PeltonBraxton Hicks contractions? Braxton Hicks contractions are tightening movements that occur in the muscles of the uterus before labor. Unlike true labor contractions, these contractions do not result in opening (dilation) and thinning of the cervix. Toward the end of pregnancy (32-34 weeks), Braxton Hicks contractions can happen more often and may become stronger. These contractions are sometimes difficult to tell apart from true labor because they can be very uncomfortable. You should not feel embarrassed if you go to the hospital with false labor. Sometimes, the only way to tell if you  are in true labor is for your health care provider to look for changes in the cervix. The health care provider will do a physical exam and may monitor your contractions. If you are not in true labor, the exam should show that your cervix is not dilating and your water has not broken. If there are no other health problems associated with your pregnancy, it is completely safe for you to be sent home with false labor. You may continue to have Braxton Hicks contractions until you go into true labor. How to tell the difference between true labor and false labor True labor  Contractions last 30-70 seconds.  Contractions become very regular.  Discomfort is usually felt in the top of the uterus, and it spreads to the lower abdomen and low back.  Contractions do not go away with walking.  Contractions usually become more intense and increase in frequency.  The cervix dilates and gets thinner. False labor  Contractions are usually shorter and not as strong as true labor contractions.  Contractions are usually irregular.  Contractions are often felt in the front of the lower abdomen and in the groin.  Contractions may go away when you walk around or change positions while lying down.  Contractions get weaker and are shorter-lasting as time goes on.  The cervix usually does not dilate or become thin. Follow these instructions at home:   Take over-the-counter and prescription medicines only as told by your health care provider.  Keep up with your usual exercises and follow other instructions from your health care provider.  Eat and drink lightly if you think you are going into labor.  If Braxton Hicks contractions are making you uncomfortable: ? Change your position from lying down or resting to walking, or change from walking to resting. ? Sit and rest in a tub of warm water. ? Drink enough fluid to keep your urine pale yellow. Dehydration may cause these contractions. ? Do slow and  deep breathing several times an hour.  Keep all follow-up prenatal visits as told by your health care provider. This is important. Contact a health care provider if:  You have a fever.  You have continuous pain in your abdomen. Get help right away if:  Your contractions become stronger, more regular, and closer together.  You have fluid leaking or gushing from your vagina.  You pass blood-tinged mucus (bloody show).  You have bleeding from your vagina.  You have low back pain that you never had before.  You feel your babys head pushing down and causing pelvic pressure.  Your baby is not moving inside you as much as it used to. Summary  Contractions that occur before labor are called Braxton Hicks contractions, false labor, or practice contractions.  Braxton Hicks contractions are usually shorter, weaker, farther apart, and less regular than true labor contractions. True labor contractions usually become progressively  stronger and regular, and they become more frequent.  Manage discomfort from [facility] contractions by changing position, resting in a warm bath, drinking plenty of water, or practicing deep breathing. This information is not intended to replace advice given to you by your health care provider. Make sure you discuss any questions you have with your health care provider. Document Released: 10/25/2016 Document Revised: 03/26/2017 Document Reviewed: 10/25/2016 Elsevier Interactive Patient Education  2019 [employer].

## 2018-11-25 ENCOUNTER — Encounter: Payer: Self-pay | Admitting: *Deleted

## 2018-11-26 ENCOUNTER — Ambulatory Visit (INDEPENDENT_AMBULATORY_CARE_PROVIDER_SITE_OTHER): Payer: Medicaid Other

## 2018-11-26 ENCOUNTER — Ambulatory Visit (INDEPENDENT_AMBULATORY_CARE_PROVIDER_SITE_OTHER): Payer: Medicaid Other | Admitting: Obstetrics and Gynecology

## 2018-11-26 ENCOUNTER — Other Ambulatory Visit: Payer: Self-pay

## 2018-11-26 ENCOUNTER — Encounter: Payer: Self-pay | Admitting: Obstetrics and Gynecology

## 2018-11-26 VITALS — BP 114/76 | HR 80 | Temp 98.2°F | Wt 141.0 lb

## 2018-11-26 DIAGNOSIS — Z1389 Encounter for screening for other disorder: Secondary | ICD-10-CM

## 2018-11-26 DIAGNOSIS — Z3A40 40 weeks gestation of pregnancy: Secondary | ICD-10-CM

## 2018-11-26 DIAGNOSIS — Z3483 Encounter for supervision of other normal pregnancy, third trimester: Secondary | ICD-10-CM

## 2018-11-26 DIAGNOSIS — Z331 Pregnant state, incidental: Secondary | ICD-10-CM

## 2018-11-26 DIAGNOSIS — O480 Post-term pregnancy: Secondary | ICD-10-CM

## 2018-11-26 LAB — POCT URINALYSIS DIPSTICK OB
Blood, UA: NEGATIVE
Glucose, UA: NEGATIVE
Ketones, UA: NEGATIVE
Nitrite, UA: NEGATIVE
POC,PROTEIN,UA: NEGATIVE

## 2018-11-26 NOTE — Progress Notes (Signed)
Subjective:  Chelsea Burgess is a 23 y.o. G2P0010 at [redacted]w[redacted]d being seen today for ongoing prenatal care.  She is currently monitored for the following issues for this low-risk pregnancy and has Supervision of normal pregnancy; Rh negative state in antepartum period; and Abnormal Pap smear of cervix on their problem list.  Patient reports general dsicomforts of pregnancy.  Contractions: Not present. Vag. Bleeding: None.  Movement: Present. Denies leaking of fluid.   The following portions of the patient's history were reviewed and updated as appropriate: allergies, current medications, past family history, past medical history, past social history, past surgical history and problem list. Problem list updated.  Objective:   Vitals:   11/26/18 1501  BP: 114/76  Pulse: 80  Temp: 98.2 F (36.8 C)  Weight: 141 lb (64 kg)    Fetal Status:     Movement: Present     General:  Alert, oriented and cooperative. Patient is in no acute distress.  Skin: Skin is warm and dry. No rash noted.   Cardiovascular: Normal heart rate noted  Respiratory: Normal respiratory effort, no problems with respiration noted  Abdomen: Soft, gravid, appropriate for gestational age. Pain/Pressure: Present     Pelvic:  Cervical exam performed        Extremities: Normal range of motion.  Edema: None  Mental Status: Normal mood and affect. Normal behavior. Normal judgment and thought content.   Urinalysis:      Assessment and Plan:  Pregnancy: G2P0010 at [redacted]w[redacted]d  1. Encounter for supervision of other normal pregnancy in third trimester Stable BPP 8/8 IOL scheduled for next Thursday' Labor precautions GBS tx while in labor 2. Screening for genitourinary condition  - POC Urinalysis [facility]  3. Pregnant state, incidental  - POC Urinalysis [facility]  Term labor symptoms and general obstetric precautions including but not limited to vaginal bleeding, contractions, leaking of fluid and fetal movement were  reviewed in detail with the patient. Please refer to After Visit Summary for other counseling recommendations.  No follow-ups on file.   Hermina Staggers, MD

## 2018-11-26 NOTE — Patient Instructions (Signed)

## 2018-11-26 NOTE — Progress Notes (Signed)
[place] 40 wks,cephalic,posterior placenta gr 3,bilat adnexa's wnl,fhr 142 bpm,afi 7.3 cm,BPP 8/8

## 2018-11-27 ENCOUNTER — Telehealth (HOSPITAL_COMMUNITY): Payer: Self-pay | Admitting: *Deleted

## 2018-11-27 NOTE — Telephone Encounter (Signed)
Preadmission screen  

## 2018-11-28 ENCOUNTER — Other Ambulatory Visit: Payer: Self-pay | Admitting: Advanced Practice Midwife

## 2018-11-28 ENCOUNTER — Encounter (HOSPITAL_COMMUNITY): Payer: Self-pay | Admitting: *Deleted

## 2018-11-28 ENCOUNTER — Telehealth (HOSPITAL_COMMUNITY): Payer: Self-pay | Admitting: *Deleted

## 2018-11-28 NOTE — Telephone Encounter (Signed)
Preadmission screen  

## 2018-11-29 ENCOUNTER — Inpatient Hospital Stay (HOSPITAL_COMMUNITY): Payer: Medicaid Other | Admitting: Anesthesiology

## 2018-11-29 ENCOUNTER — Other Ambulatory Visit: Payer: Self-pay

## 2018-11-29 ENCOUNTER — Inpatient Hospital Stay (HOSPITAL_COMMUNITY)
Admission: AD | Admit: 2018-11-29 | Discharge: 2018-12-01 | DRG: 807 | Disposition: A | Discharge status: Home / Self Care | Payer: Medicaid Other | Attending: Obstetrics and Gynecology | Admitting: Obstetrics and Gynecology

## 2018-11-29 ENCOUNTER — Encounter (HOSPITAL_COMMUNITY): Payer: Self-pay | Admitting: *Deleted

## 2018-11-29 DIAGNOSIS — O99824 Streptococcus B carrier state complicating childbirth: Secondary | ICD-10-CM | POA: Diagnosis present

## 2018-11-29 DIAGNOSIS — Z1159 Encounter for screening for other viral diseases: Secondary | ICD-10-CM

## 2018-11-29 DIAGNOSIS — Z6791 Unspecified blood type, Rh negative: Secondary | ICD-10-CM | POA: Diagnosis not present

## 2018-11-29 DIAGNOSIS — O4202 Full-term premature rupture of membranes, onset of labor within 24 hours of rupture: Secondary | ICD-10-CM

## 2018-11-29 DIAGNOSIS — Z3403 Encounter for supervision of normal first pregnancy, third trimester: Secondary | ICD-10-CM

## 2018-11-29 DIAGNOSIS — Z3A40 40 weeks gestation of pregnancy: Secondary | ICD-10-CM | POA: Diagnosis not present

## 2018-11-29 DIAGNOSIS — O480 Post-term pregnancy: Secondary | ICD-10-CM | POA: Diagnosis present

## 2018-11-29 DIAGNOSIS — A491 Streptococcal infection, unspecified site: Secondary | ICD-10-CM

## 2018-11-29 DIAGNOSIS — O26893 Other specified pregnancy related conditions, third trimester: Secondary | ICD-10-CM | POA: Diagnosis present

## 2018-11-29 HISTORY — DX: Herpesviral infection, unspecified: B00.9

## 2018-11-29 LAB — CBC
HCT: 38.1 % (ref 36.0–46.0)
Hemoglobin: 12.4 g/dL (ref 12.0–15.0)
MCH: 28.1 pg (ref 26.0–34.0)
MCHC: 32.5 g/dL (ref 30.0–36.0)
MCV: 86.2 fL (ref 80.0–100.0)
Platelets: 175 10*3/uL (ref 150–400)
RBC: 4.42 MIL/uL (ref 3.87–5.11)
RDW: 13.9 % (ref 11.5–15.5)
WBC: 11.7 10*3/uL — ABNORMAL HIGH (ref 4.0–10.5)
nRBC: 0 % (ref 0.0–0.2)

## 2018-11-29 LAB — SARS CORONAVIRUS 2: SARS Coronavirus 2: NOT DETECTED

## 2018-11-29 LAB — POCT FERN TEST: POCT Fern Test: POSITIVE

## 2018-11-29 MED ORDER — EPHEDRINE 5 MG/ML INJ: 10.0000 mg | INTRAVENOUS | Status: DC | PRN

## 2018-11-29 MED ORDER — COCONUT OIL OIL: 1.0000 "application " | TOPICAL_OIL | Status: DC | PRN

## 2018-11-29 MED ORDER — OXYTOCIN 40 UNITS IN NORMAL SALINE INFUSION - SIMPLE MED
2.5000 [IU]/h | INTRAVENOUS | Status: DC
  Filled 2018-11-29: qty 1000

## 2018-11-29 MED ORDER — FENTANYL-BUPIVACAINE-NACL 0.5-0.125-0.9 MG/250ML-% EP SOLN: 12.0000 mL/h | EPIDURAL | Status: DC | PRN

## 2018-11-29 MED ORDER — ACETAMINOPHEN 325 MG PO TABS: 650.0000 mg | ORAL_TABLET | ORAL | Status: DC | PRN

## 2018-11-29 MED ORDER — FERROUS SULFATE 325 (65 FE) MG PO TABS
325.0000 mg | ORAL_TABLET | Freq: Two times a day (BID) | ORAL | Status: DC
  Administered 2018-11-30: 325 mg via ORAL
  Filled 2018-11-29 (×2): qty 1

## 2018-11-29 MED ORDER — IBUPROFEN 600 MG PO TABS
600.0000 mg | ORAL_TABLET | Freq: Four times a day (QID) | ORAL | Status: DC
  Filled 2018-11-29: qty 1

## 2018-11-29 MED ORDER — OXYCODONE-ACETAMINOPHEN 5-325 MG PO TABS: 2.0000 | ORAL_TABLET | ORAL | Status: DC | PRN

## 2018-11-29 MED ORDER — OXYCODONE-ACETAMINOPHEN 5-325 MG PO TABS: 1.0000 | ORAL_TABLET | ORAL | Status: DC | PRN

## 2018-11-29 MED ORDER — LIDOCAINE HCL (PF) 1 % IJ SOLN
INTRAMUSCULAR | Status: DC | PRN
  Administered 2018-11-29: 5 mL via EPIDURAL

## 2018-11-29 MED ORDER — DIPHENHYDRAMINE HCL 50 MG/ML IJ SOLN: 12.5000 mg | INTRAMUSCULAR | Status: DC | PRN

## 2018-11-29 MED ORDER — LACTATED RINGERS IV SOLN
500.0000 mL | Freq: Once | INTRAVENOUS | Status: AC
  Administered 2018-11-29: 500 mL via INTRAVENOUS

## 2018-11-29 MED ORDER — COMPLETENATE 29-1 MG PO CHEW: 1.0000 | CHEWABLE_TABLET | Freq: Every day | ORAL | Status: DC

## 2018-11-29 MED ORDER — PHENYLEPHRINE 40 MCG/ML (10ML) SYRINGE FOR IV PUSH (FOR BLOOD PRESSURE SUPPORT)
80.0000 ug | PREFILLED_SYRINGE | INTRAVENOUS | Status: DC | PRN
  Filled 2018-11-29: qty 10

## 2018-11-29 MED ORDER — OXYTOCIN BOLUS FROM INFUSION
500.0000 mL | Freq: Once | INTRAVENOUS | Status: AC
  Administered 2018-11-29: 500 mL via INTRAVENOUS

## 2018-11-29 MED ORDER — LIDOCAINE HCL (PF) 1 % IJ SOLN: 30.0000 mL | INTRAMUSCULAR | Status: DC | PRN

## 2018-11-29 MED ORDER — WITCH HAZEL-GLYCERIN EX PADS: 1.0000 "application " | MEDICATED_PAD | CUTANEOUS | Status: DC | PRN

## 2018-11-29 MED ORDER — LACTATED RINGERS IV SOLN: 500.0000 mL | INTRAVENOUS | Status: DC | PRN

## 2018-11-29 MED ORDER — MAGNESIUM HYDROXIDE 400 MG/5ML PO SUSP: 30.0000 mL | ORAL | Status: DC | PRN

## 2018-11-29 MED ORDER — FENTANYL-BUPIVACAINE-NACL 0.5-0.125-0.9 MG/250ML-% EP SOLN
12.0000 mL/h | EPIDURAL | Status: DC | PRN
  Filled 2018-11-29: qty 250

## 2018-11-29 MED ORDER — DIPHENHYDRAMINE HCL 25 MG PO CAPS: 25.0000 mg | ORAL_CAPSULE | Freq: Four times a day (QID) | ORAL | Status: DC | PRN

## 2018-11-29 MED ORDER — SIMETHICONE 80 MG PO CHEW: 80.0000 mg | CHEWABLE_TABLET | ORAL | Status: DC | PRN

## 2018-11-29 MED ORDER — PRENATAL MULTIVITAMIN CH: 1.0000 | ORAL_TABLET | Freq: Every day | ORAL | Status: DC

## 2018-11-29 MED ORDER — SOD CITRATE-CITRIC ACID 500-334 MG/5ML PO SOLN
30.0000 mL | ORAL | Status: DC | PRN
  Administered 2018-11-29: 30 mL via ORAL
  Filled 2018-11-29: qty 30

## 2018-11-29 MED ORDER — ONDANSETRON HCL 4 MG/2ML IJ SOLN: 4.0000 mg | Freq: Four times a day (QID) | INTRAMUSCULAR | Status: DC | PRN

## 2018-11-29 MED ORDER — BENZOCAINE-MENTHOL 20-0.5 % EX AERO: 1.0000 "application " | INHALATION_SPRAY | CUTANEOUS | Status: DC | PRN

## 2018-11-29 MED ORDER — LACTATED RINGERS IV SOLN
INTRAVENOUS | Status: DC
  Administered 2018-11-29 (×2): via INTRAVENOUS

## 2018-11-29 MED ORDER — LACTATED RINGERS IV SOLN: 500.0000 mL | Freq: Once | INTRAVENOUS | Status: DC

## 2018-11-29 MED ORDER — FENTANYL CITRATE (PF) 100 MCG/2ML IJ SOLN
50.0000 ug | INTRAMUSCULAR | Status: DC | PRN
  Administered 2018-11-29 (×2): 100 ug via INTRAVENOUS
  Filled 2018-11-29 (×2): qty 2

## 2018-11-29 MED ORDER — SODIUM CHLORIDE (PF) 0.9 % IJ SOLN
INTRAMUSCULAR | Status: DC | PRN
  Administered 2018-11-29: 12 mL/h via EPIDURAL

## 2018-11-29 MED ORDER — PENICILLIN G 3 MILLION UNITS IVPB - SIMPLE MED
3.0000 10*6.[IU] | INTRAVENOUS | Status: DC
  Administered 2018-11-29 (×2): 3 10*6.[IU] via INTRAVENOUS
  Filled 2018-11-29 (×2): qty 100

## 2018-11-29 MED ORDER — PHENYLEPHRINE 40 MCG/ML (10ML) SYRINGE FOR IV PUSH (FOR BLOOD PRESSURE SUPPORT): 80.0000 ug | PREFILLED_SYRINGE | INTRAVENOUS | Status: DC | PRN

## 2018-11-29 MED ORDER — ONDANSETRON HCL 4 MG PO TABS: 4.0000 mg | ORAL_TABLET | ORAL | Status: DC | PRN

## 2018-11-29 MED ORDER — IBUPROFEN 100 MG/5ML PO SUSP
600.0000 mg | Freq: Four times a day (QID) | ORAL | Status: DC
  Administered 2018-11-29 – 2018-12-01 (×7): 600 mg via ORAL
  Filled 2018-11-29 (×14): qty 30

## 2018-11-29 MED ORDER — ONDANSETRON HCL 4 MG/2ML IJ SOLN: 4.0000 mg | INTRAMUSCULAR | Status: DC | PRN

## 2018-11-29 MED ORDER — SODIUM CHLORIDE 0.9 % IV SOLN
5.0000 10*6.[IU] | Freq: Once | INTRAVENOUS | Status: AC
  Administered 2018-11-29: 05:00:00 5 10*6.[IU] via INTRAVENOUS
  Filled 2018-11-29: qty 5

## 2018-11-29 MED ORDER — DIBUCAINE (PERIANAL) 1 % EX OINT: 1.0000 "application " | TOPICAL_OINTMENT | CUTANEOUS | Status: DC | PRN

## 2018-11-29 NOTE — Lactation Note (Signed)
This note was copied from a baby's chart. Lactation Consultation Note  Patient Name: Chelsea Burgess XHFSF'[id number] Date: 11/29/2018 Reason for consult: Initial assessment;Term;Infant < 6lbs;Primapara;1st time breastfeeding;Other (Comment)(DAT (+))  P1, 8 hour female infant.  LC entered room per nurse mom wants help with latching infant to breast. LC entered room, per mom , she  tried earlier to latch infant to breast and infant would not latch. Infant was  showing hunger cues, per mom , she doesn't want to breastfeed infant at this time she wants to give formula feed him tonight,  she is very tired and wants to sleep. Mom doesn't want to pump tonight either, LC discussed importance of pumping to help with breast stimulation and establishing milk supply. Per dad, he has been doing STS with infant and need help with feeding infant Similac Neosure 22 kcal, bad is using yellow slow flow bottle nipple, LC help Dad with pace feeding infant and Dad had given 5 ml was still feeding infant when Brunson left room.  Dad know to offer 5-15 ml if formula feeding only to an infant less than 24 hours of life. Mom knows if she changes her mind to breastfeed infant tonight she can ask for [facility] services.  Maternal Data Formula Feeding for Exclusion: Yes Reason for exclusion: Mother's choice to formula and breast feed on admission Has patient been taught Hand Expression?: Yes Does the patient have breastfeeding experience prior to this delivery?: No  Feeding Feeding Type: Breast Milk with Formula added  LATCH Score                   Interventions Interventions: Breast feeding basics reviewed;Skin to skin;Hand express;Breast compression;Breast massage;DEBP;Shells  Lactation Tools Discussed/Used Tools: Pump;Shells Shell Type: Inverted Breast pump type: Double-Electric Breast Pump WIC Program: Yes Pump Review: Setup, frequency, and cleaning;Milk Storage Initiated by:: RN Vania Rea and MPeck (adjusted junctures  on pump) Date initiated:: 11/29/18   Consult Status Consult Status: Follow-up Date: 11/30/18 Follow-up type: In-patient    [name] 11/29/2018, 11:18 PM

## 2018-11-29 NOTE — MAU Note (Signed)
PT SAYS UC STRONG SINCE [place]- PNC WITH  [facility] - VE IN OFFICE  LAST WED - 2 CM .  HAS HX  OF HSV - NO OUTBREAK DURING PREG-  DENIES ANY S/S NOW.   NOT TAKING VALTREX.   GBS- POSITIVE

## 2018-11-29 NOTE — Anesthesia Preprocedure Evaluation (Signed)
Anesthesia Evaluation  Patient identified by MRN, date of birth, ID band Patient awake    Reviewed: Allergy & Precautions, Patient's Chart, lab work & pertinent test results  Airway Mallampati: I       Dental no notable dental hx.    Pulmonary    Pulmonary exam normal        Cardiovascular negative cardio ROS Normal cardiovascular exam     Neuro/Psych    GI/Hepatic negative GI ROS, Neg liver ROS,   Endo/Other  negative endocrine ROS  Renal/GU negative Renal ROS     Musculoskeletal   Abdominal   Peds  Hematology   Anesthesia Other Findings   Reproductive/Obstetrics (+) Pregnancy                             Anesthesia Physical Anesthesia Plan  ASA: II  Anesthesia Plan: Epidural   Post-op Pain Management:    Induction:   PONV Risk Score and Plan:   Airway Management Planned:   Additional Equipment: None  Intra-op Plan:   Post-operative Plan:   Informed Consent: I have reviewed the patients History and Physical, chart, labs and discussed the procedure including the risks, benefits and alternatives for the proposed anesthesia with the patient or authorized representative who has indicated his/her understanding and acceptance.       Plan Discussed with:   Anesthesia Plan Comments: (Lab Results      Component                Value               Date                      WBC                      11.7 (H)            11/29/2018                HGB                      12.4                11/29/2018                HCT                      38.1                11/29/2018                MCV                      86.2                11/29/2018                PLT                      175                 11/29/2018            COVID-19 Labs  No results for input(s): DDIMER, FERRITIN, LDH, CRP in the last 72 hours.  Lab Results      Component  Value               Date                       [id number]              NOT DETECTED        11/29/2018            )        Anesthesia Quick Evaluation

## 2018-11-29 NOTE — Lactation Note (Signed)
This note was copied from a baby's chart. Lactation Consultation Note  Patient Name: Chelsea Burgess Date: 11/29/2018 Reason for consult: Initial assessment;Term;Infant < 6lbs;Primapara;1st time breastfeeding;Other (Comment)(DAT (+))  5 hours old FT < 6 lbs female who is being partially BF and formula fed by his mother, she's a P1. She participated in the [facility] program at the [facility]. [facility] taught mom how to hand express, but no colostrum observed at this point. Mom experienced (+) breast changes during the pregnancy. When doing hand expression noticed that her right nipple is nearly flat/inverted but it is compressible upon stimulation. However her left nipple is truly inverted and it inverted when doing the pinch test. Mom voiced they're not like that during the pregnancy and they used to "pop up" noticed mom had some areola edema.  RN Vania Rea has been very proactive and already set mom up with breast shells and a DEBP, baby is DAT (+). Mom doesn't have a pump at home but she's planning on getting one, LC recommended a couple of brands. Mom asked LC to reviewed pump instructions again, LC noticed that the junctures in her pump were loose and adjusted them, let mom know that she'll feel a difference in the suction the next time she pumps.   Baby asleep doing STS with mom when entering the room. Offered assistance with latch but mom politely declined, she said baby wasn't due for a feeding. Asked mom to call for assistance when needed. Reviewed normal newborn behavior, cluster feeding, feeding cues, LPI policy and supplementation and breastmilk storage guidelines. Baby is currently on Similac 22 calorie formula.  Feeding plan:  1. Encouraged mom to feed baby 8-12 times/24 hours or sooner if feeding cues are present. Mom will attempt feedings at the breast right after pumping when her nipples are temporarily everted. 2. Every feeding at the breast will be followed by supplementation with EBM or Similac  22 calorie formula in the volumes required for supplementation according to baby's age in hours 3. Mom will pump every 3 hours and at least once at night 4. If baby doesn't cue by the 3 hour mark parents will wake him up to feed. Mom understands that BF is mostly "practice at the breast" for now and the main source of baby's calories right now will be delivered through supplementation   BF brochure, BF resources, LPI handout and feeding diary were reviewed. Parents reported all questions and concerns were answered, they're both aware of [place] OP services and will call PRN.    Maternal Data Formula Feeding for Exclusion: Yes Reason for exclusion: Mother's choice to formula and breast feed on admission Has patient been taught Hand Expression?: Yes Does the patient have breastfeeding experience prior to this delivery?: No  Feeding Feeding Type: Formula  LATCH Score Latch: Too sleepy or reluctant, no latch achieved, no sucking elicited.  Audible Swallowing: None  Type of Nipple: Flat  Comfort (Breast/Nipple): Soft / non-tender  Hold (Positioning): Assistance needed to correctly position infant at breast and maintain latch.  LATCH Score: 4  Interventions Interventions: Breast feeding basics reviewed;Skin to skin;Hand express;Breast compression;Breast massage;DEBP;Shells  Lactation Tools Discussed/Used Tools: Pump;Shells Shell Type: Inverted Breast pump type: Double-Electric Breast Pump WIC Program: Yes Pump Review: Setup, frequency, and cleaning;Milk Storage Initiated by:: RN Vania Rea and MPeck (adjusted junctures on pump) Date initiated:: 11/29/18   Consult Status Consult Status: Follow-up Date: 11/30/18 Follow-up type: In-patient    Chelsea Burgess 11/29/2018, 7:51 PM

## 2018-11-29 NOTE — Discharge Instructions (Signed)

## 2018-11-29 NOTE — Discharge Summary (Signed)
Obstetrics Discharge Summary OB/GYN Faculty Practice   Patient Name: Chelsea Burgess DOB: 04-09-1996 MRN: [id number]  Date of admission: 11/29/2018 Delivering MD: Darlina Rumpf   Date of discharge: 12/01/2018  Admitting diagnosis: 40 wks started bleeding and having ctx every 2 to 3 min was 2cm last wed Intrauterine pregnancy: [redacted]w[redacted]d     Secondary diagnosis:   Active Problems:   Rh negative state in antepartum period   Post-dates pregnancy   Group B streptococcal infection   NSVD (normal spontaneous vaginal delivery)   Additional problems:  . None     Discharge diagnosis: Term Pregnancy Delivered                                            Postpartum procedures: Rhophylac 01/29/75 Complications: None  Outpatient Follow-Up: --Desires IUD, changed from Depo on admission.   Hospital course: Chelsea Burgess is a 23 y.o. [redacted]w[redacted]d who was admitted for SROM. Her pregnancy was complicated by the following: Rh negative state in antepartum period; Abnormal Pap smear of cervix; Post-dates pregnancy; and Group B streptococcal infection. Her labor course was unremarkable. Delivery was uncomplicated. Please see delivery note for additional details. Her postpartum course was uncomplicated. She is breast and bottle feeding and infant is reported to have some feeding concerns.  Discussed mom staying as baby patient if infant has to stay. By day of discharge, she was passing flatus, urinating, eating and drinking without difficulty. Her pain was well-controlled, and she was discharged home with ibuprofen. She will follow-up in clinic in 4-6 weeks.   Physical exam  Vitals:   11/30/18 0115 11/30/18 0445 11/30/18 2257 12/01/18 0547  BP: 111/62 106/78 119/78 (!) 103/59  Pulse: 83 77 78 74  Resp: 18 18 18 20   Temp: 98.3 F (36.8 C) 98.1 F (36.7 C) 98.5 F (36.9 C) 98.2 F (36.8 C)  TempSrc: Oral Oral  Oral  SpO2: 100% 100% 100% 100%  Weight:      Height:       General: well developed,  well nourished, no distress HEENT: normocephalic CV: normal rate Pulm/chest wall: normal effort Abdomen: soft Neuro: alert and oriented x 3 Skin: warm, dry Psych: affect normal Lochia: appropriate Uterine Fundus: firm Incision: N/A DVT Evaluation: No evidence of DVT seen on physical exam. Labs: Lab Results  Component Value Date   WBC 16.9 (H) 12/01/2018   HGB 10.7 (L) 12/01/2018   HCT 33.8 (L) 12/01/2018   MCV 87.3 12/01/2018   PLT 192 12/01/2018   CMP Latest Ref Rng & Units 12/21/2017  Glucose 70 - 99 mg/dL 116(H)  BUN 6 - 20 mg/dL 8  Creatinine 0.44 - 1.00 mg/dL 0.69  Sodium 135 - 145 mmol/L 138  Potassium 3.5 - 5.1 mmol/L 3.4(L)  Chloride 98 - 111 mmol/L 107  CO2 22 - 32 mmol/L 24  Calcium 8.9 - 10.3 mg/dL 9.4  Total Protein 6.5 - 8.1 g/dL 6.7  Total Bilirubin 0.3 - 1.2 mg/dL 0.2(L)  Alkaline Phos 38 - 126 U/L 59  AST 15 - 41 U/L 14(L)  ALT 0 - 44 U/L 12    Discharge instructions: Per After Visit Summary and "Baby and Me Booklet"  After visit meds:  Allergies as of 12/01/2018   No Known Allergies     Medication List    STOP taking these medications   Blood Pressure Monitor Automat Kerrin Mo  TAKE these medications   ibuprofen 100 MG/5ML suspension Commonly known as:  ADVIL Take 30 mLs (600 mg total) by mouth every 6 (six) hours.   PRENATAL GUMMIES/DHA & FA PO Take by mouth.      Postpartum contraception: IUD Mirena Diet: Routine Diet Activity: Advance as tolerated. Pelvic rest for 6 weeks.   Follow-up Appt:  Follow-up Visit:No follow-ups on file.  Newborn Data: Live born female  Birth Weight:   APGAR: 8, 9  Newborn Delivery   Birth date/time:  11/29/2018 14:47:00 Delivery type:  Vaginal, Spontaneous     Baby Feeding: Both bottle and breast Disposition:home with mother depending on pediatrician's evaluation  Clinic messaged on 11/29/18 to coordinate postpartum care, message resent on 12/01/18 with pt change in birth control plan.     \

## 2018-11-29 NOTE — Anesthesia Procedure Notes (Signed)
Epidural Patient location during procedure: OB Start time: 11/29/2018 9:22 AM End time: 11/29/2018 9:28 AM  Staffing Anesthesiologist: Effie Berkshire, MD Performed: anesthesiologist   Preanesthetic Checklist Completed: patient identified, site marked, surgical consent, pre-op evaluation, timeout performed, IV checked, risks and benefits discussed and monitors and equipment checked  Epidural Patient position: sitting Prep: ChloraPrep Patient monitoring: heart rate, continuous pulse ox and blood pressure Approach: midline Location: L3-L4 Injection technique: LOR saline  Needle:  Needle type: Tuohy  Needle gauge: 17 G Needle length: 9 cm Catheter type: closed end flexible Catheter size: 20 Guage Test dose: negative and 1.5% lidocaine  Assessment Events: blood not aspirated, injection not painful, no injection resistance and no paresthesia  Additional Notes LOR @ 4.5  Patient identified. Risks/Benefits/Options discussed with patient including but not limited to bleeding, infection, nerve damage, paralysis, failed block, incomplete pain control, headache, blood pressure changes, nausea, vomiting, reactions to medications, itching and postpartum back pain. Confirmed with bedside nurse the patient's most recent platelet count. Confirmed with patient that they are not currently taking any anticoagulation, have any bleeding history or any family history of bleeding disorders. Patient expressed understanding and wished to proceed. All questions were answered. Sterile technique was used throughout the entire procedure. Please see nursing notes for vital signs. Test dose was given through epidural catheter and negative prior to continuing to dose epidural or start infusion. Warning signs of high block given to the patient including shortness of breath, tingling/numbness in hands, complete motor block, or any concerning symptoms with instructions to call for help. Patient was given instructions on  fall risk and not to get out of bed. All questions and concerns addressed with instructions to call with any issues or inadequate analgesia.    Reason for block:procedure for pain

## 2018-11-29 NOTE — Progress Notes (Addendum)
TARON CONREY is a 23 y.o. G2P0010 at [redacted]w[redacted]d   Subjective: Increasingly uncomfortable with contractions, coping well with IV analgesia. Desires epidural.  Objective: BP (!) 106/56   Pulse 88   Temp 98.4 F (36.9 C) (Oral)   Resp 18   Ht 5\' 7"  (1.702 m)   Wt 65.3 kg   LMP 02/19/2018 (Approximate)   SpO2 100%   BMI 22.55 kg/m  No intake/output data recorded. No intake/output data recorded.  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 No concerning findings on sterile speculum exam Pt may have epidural now Reevaluate for Pitocin augmentation PRN Anticipate NSVD  Darlina Rumpf, CNM 11/29/2018, 8:30 AM

## 2018-11-29 NOTE — H&P (Addendum)
OBSTETRIC ADMISSION HISTORY AND PHYSICAL  Chelsea Burgess is a 24 y.o. female G2P0010 with IUP at [redacted]w[redacted]d by LMP consistent with ultrasound presenting for SROM. Pt reports that around 2100 she had a gush of pink fluid. Pt reports painful contractions every couple of minutes, but does not want pain medication at this time. Reports good fetal movement. Pt reports that she has a history of HSV, but has not had an outbreak or s/s of an outbreak in over a year. Denies other complaints at this time.   She received her prenatal care at [facility]  Support person in labor: Tyrell  Ultrasounds . Anatomy U/S: normal  Prenatal History/Complications: . None . Rh negative . Group B Streptococcus  Past Medical History: Past Medical History:  Diagnosis Date  . Herpes   . Medical history non-contributory     Past Surgical History: Past Surgical History:  Procedure Laterality Date  . NO PAST SURGERIES      Obstetrical History: OB History    Gravida  2   Para      Term      Preterm      AB  1   Living  0     SAB  1   TAB      Ectopic      Multiple      Live Births              Social History: Social History   Socioeconomic History  . Marital status: Single    Spouse name: Not on file  . Number of children: 0  . Years of education: Not on file  . Highest education level: Not on file  Occupational History  . Not on file  Social Needs  . Financial resource strain: Not very hard  . Food insecurity:    Worry: Never true    Inability: Never true  . Transportation needs:    Medical: No    Non-medical: No  Tobacco Use  . Smoking status: Never Smoker  . Smokeless tobacco: Never Used  Substance and Sexual Activity  . Alcohol use: Not Currently  . Drug use: Never  . Sexual activity: Yes    Birth control/protection: Condom  Lifestyle  . Physical activity:    Days per week: 1 day    Minutes per session: 10 min  . Stress: Not at all  Relationships  .  Social connections:    Talks on phone: Three times a week    Gets together: Twice a week    Attends religious service: More than 4 times per year    Active member of club or organization: No    Attends meetings of clubs or organizations: Never    Relationship status: Never married  Other Topics Concern  . Not on file  Social History Narrative   ** Merged History Encounter **        Family History: Family History  Problem Relation Age of Onset  . Stroke Mother   . Heart attack Mother   . Heart attack Father   . Stroke Maternal Aunt   . Diabetes Maternal Grandmother   . Cancer Maternal Grandfather        colon    Allergies: No Known Allergies  Medications Prior to Admission  Medication Sig Dispense Refill Last Dose  . Prenatal MV-Min-FA-Omega-3 (PRENATAL GUMMIES/DHA & FA PO) Take by mouth.   11/28/2018 at Unknown time  . Blood Pressure Monitoring (BLOOD PRESSURE MONITOR AUTOMAT) DEVI For  home BP monitoring 1 Device 0 Taking     Review of Systems  All systems reviewed and negative except as stated in HPI  Blood pressure 120/80, pulse 91, temperature 98.3 F (36.8 C), temperature source Oral, resp. rate 16, height 5\' 7"  (1.702 m), weight 65.4 kg, last menstrual period 02/19/2018, SpO2 100 %, unknown if currently breastfeeding. General appearance: alert, cooperative, appears stated age and no distress Lungs: no respiratory distress Heart: regular rate  Abdomen: soft, non-tender; gravid  Pelvic: adequate Extremities: Homans sign is negative, no sign of DVT Presentation: cephalic Fetal monitoring: FHR 130; moderate variability, +accels, 1 variable decel Uterine activity: contracting every 2-4 mins.   Dilation: 2.5 Effacement (%): 70 Station: -1 Exam by:: benji stanley RN  Prenatal labs: ABO, Rh: O/Negative/-- (12/26 1512) Antibody: Negative (03/11 0953) Rubella: 11.30 (12/26 1512) RPR: Non Reactive (03/11 0953)  HBsAg: Negative (12/26 1512)  HIV: Non Reactive  (03/11 0953)  GBS: Positive (05/12 1500)  Glucola: normal Genetic screening: AFP: negative, [place]/HgbE negative, declined CF  Prenatal Transfer Tool  Maternal Diabetes: No Genetic Screening: Normal Maternal Ultrasounds/Referrals: Normal Fetal Ultrasounds or other Referrals:  None Maternal Substance Abuse:  No Significant Maternal Medications:  None Significant Maternal Lab Results: Lab values include: Group B Strep positive, Rh negative  Results for orders placed or performed during the hospital encounter of 11/29/18 (from the past 24 hour(s))  POCT fern test   Collection Time: 11/29/18  3:13 AM  Result Value Ref Range   POCT Fern Test Positive = ruptured amniotic membanes     Patient Active Problem List   Diagnosis Date Noted  . Post-dates pregnancy 11/29/2018  . Abnormal Pap smear of cervix 06/27/2018  . Rh negative state in antepartum period 06/20/2018  . Supervision of normal pregnancy 06/19/2018    Assessment/Plan:  Chelsea Burgess is a 23 y.o. G2P0010 at 3841w3d here for SROM and early labor.  Labor:  -- Expectant management at this time as pt is contracting every 4 minutes and 2.5cm -- Pain control: planning epidural -- PCN for +GBS -- Anticipate SVD  Fetal Wellbeing:  -- Cephalic by RN cervical exam.  -- Continuous fetal monitoring    Postpartum Planning -- Breast -- Contraception: pt unsure. Pt considering Depo. -- Circumcision: planning. Unsure on inpatient vs outpatient. Counseled patient and wants to talk to FOB about it.   Camelia Enganielle Simpson, SNM  CNM attestation:  I have seen and examined this patient; I agree with above documentation in the student midwife's note.   Chelsea Burgess is a 23 y.o. G2P0010 here for SROM, possible SOL  PE: BP 119/78   Pulse (!) 103   Temp 98.3 F (36.8 C) (Oral)   Resp 18   Ht 5\' 7"  (1.702 m)   Wt 65.3 kg   LMP 02/19/2018 (Approximate)   SpO2 98%   BMI 22.55 kg/m  Gen: calm comfortable, NAD Resp: normal  effort, no distress Abd: gravid  ROS, labs, PMH reviewed  Plan: Admit to Labor and Delivery Expectant management for now- may need augmentation PCN for GBS ppx Anticipate SVD Eval for Rhogam PP  Arabella MerlesKimberly D Ginia Rudell CNM 11/29/2018, 10:27 AM

## 2018-11-30 LAB — RPR: RPR Ser Ql: NONREACTIVE

## 2018-11-30 MED ORDER — RHO D IMMUNE GLOBULIN 1500 UNIT/2ML IJ SOSY
300.0000 ug | PREFILLED_SYRINGE | Freq: Once | INTRAMUSCULAR | Status: AC
  Administered 2018-11-30: 300 ug via INTRAVENOUS
  Filled 2018-11-30: qty 2

## 2018-11-30 NOTE — Progress Notes (Signed)
Post Partum Day 1 Subjective: no complaints, up ad lib, voiding, tolerating PO and denies lightheadedness, dizziness, tachycardia.  Objective: Blood pressure 106/78, pulse 77, temperature 98.1 F (36.7 C), temperature source Oral, resp. rate 18, height 5\' 7"  (1.702 m), weight 65.3 kg, last menstrual period 02/19/2018, SpO2 100 %, unknown if currently breastfeeding. Intake/Output      06/06 0701 - 06/07 0700 06/07 0701 - 06/08 0700   P.O. 120    I.V. (mL/kg) 468.8 (7.2)    IV Piggyback 40    Total Intake(mL/kg) 628.8 (9.6)    Urine (mL/kg/hr) 1000 (0.6)    Blood 194    Total Output 1194    Net -565.2           Physical Exam:  General: alert, cooperative, appears stated age and no distress Lungs: CTAB, no crackles or wheezes Heart: RRR, no m/r/r.  Lochia: appropriate Uterine Fundus: firm Incision: NA DVT Evaluation: No evidence of DVT seen on physical exam.  Recent Labs    11/29/18 0351  HGB 12.4  HCT 38.1    Assessment/Plan: Postpartum:   Plan for discharge tomorrow  Breastfeeding  Continue daily lactation consult  Circumcision prior to discharge   Likely 12/01/18  Contraception   Will continue to think about it over the next day. Contraception options discussed. Patient to ask if she has further questions  Alloimmunization:   353mcg Rhophylac x 1 IV  Am Hemoglobin    LOS: 1 day   Wilber Oliphant 11/30/2018, 10:42 AM

## 2018-11-30 NOTE — Anesthesia Postprocedure Evaluation (Signed)
Anesthesia Post Note  Patient: ANNALINA NEEDLES  Procedure(s) Performed: AN AD [place]     Patient location during evaluation: Mother Baby Anesthesia Type: Epidural Level of consciousness: awake and alert Pain management: pain level controlled Vital Signs Assessment: post-procedure vital signs reviewed and stable Respiratory status: spontaneous breathing, nonlabored ventilation and respiratory function stable Cardiovascular status: stable Postop Assessment: no headache, no backache and epidural receding Anesthetic complications: no    Last Vitals:  Vitals:   11/30/18 0115 11/30/18 0445  BP: 111/62 106/78  Pulse: 83 77  Resp: 18 18  Temp: 36.8 C 36.7 C  SpO2: 100% 100%    Last Pain:  Vitals:   11/30/18 0801  TempSrc:   PainSc: 0-No pain   Pain Goal: Patients Stated Pain Goal: 5 (11/29/18 0141)              Epidural/Spinal Function Cutaneous sensation: Normal sensation (11/30/18 0801), Patient able to flex knees: Yes (11/30/18 0801), Patient able to lift hips off bed: Yes (11/30/18 0801), Back pain beyond tenderness at insertion site: No (11/30/18 0801), Progressively worsening motor and/or sensory loss: No (11/30/18 0801), Bowel and/or bladder incontinence post epidural: No (11/30/18 0801)  Rayvon Char

## 2018-11-30 NOTE — Lactation Note (Signed)
This note was copied from a baby's chart. Lactation Consultation Note  Patient Name: Chelsea Burgess MCNOB'S Date: 11/30/2018 Reason for consult: Follow-up assessment < 6 lbs. Baby 88 hours old.  Mother has been primarily formula feeding but wanted help with latching. Mother's nipples invert when compressed.  She has shells and DEBP. Reviewed hand expression and pumping. Assisted w/ latching baby in football hold.  He was sleepy after recently receiving bottle of formula.  Reviewed waking techniques.  Unwrapped for feeding.  Baby latched off and on from approx 8 min. Suggest next time baby cues mother breastfeed first before giving bottle of FO. Suggest she call for assistance as needed.  Encouraged mother to continue pumping q 3 hours after latching. Feed on demand approximately 8-12 times per day at least q 3 hours.      Maternal Data Has patient been taught Hand Expression?: Yes Does the patient have breastfeeding experience prior to this delivery?: No  Feeding Feeding Type: Breast Fed  LATCH Score Latch: Repeated attempts needed to sustain latch, nipple held in mouth throughout feeding, stimulation needed to elicit sucking reflex.  Audible Swallowing: A few with stimulation  Type of Nipple: Flat  Comfort (Breast/Nipple): Soft / non-tender  Hold (Positioning): Assistance needed to correctly position infant at breast and maintain latch.  LATCH Score: 6  Interventions Interventions: Breast feeding basics reviewed;Assisted with latch;Hand express;DEBP;Breast compression  Lactation Tools Discussed/Used Tools: Shells;Pump   Consult Status Consult Status: Follow-up Date: 12/01/18 Follow-up type: In-patient    Vivianne Master [facility] 11/30/2018, 2:23 PM

## 2018-12-01 LAB — RH IG WORKUP (INCLUDES ABO/RH)
ABO/RH(D): O NEG
Fetal Screen: NEGATIVE
Gestational Age(Wks): 40
Unit division: 0

## 2018-12-01 LAB — CBC
HCT: 33.8 % — ABNORMAL LOW (ref 36.0–46.0)
Hemoglobin: 10.7 g/dL — ABNORMAL LOW (ref 12.0–15.0)
MCH: 27.6 pg (ref 26.0–34.0)
MCHC: 31.7 g/dL (ref 30.0–36.0)
MCV: 87.3 fL (ref 80.0–100.0)
Platelets: 192 10*3/uL (ref 150–400)
RBC: 3.87 MIL/uL (ref 3.87–5.11)
RDW: 14.3 % (ref 11.5–15.5)
WBC: 16.9 10*3/uL — ABNORMAL HIGH (ref 4.0–10.5)
nRBC: 0 % (ref 0.0–0.2)

## 2018-12-01 MED ORDER — IBUPROFEN 100 MG/5ML PO SUSP: 600.0000 mg | Freq: Four times a day (QID) | ORAL | 0 refills | Status: DC

## 2018-12-01 NOTE — Lactation Note (Signed)
This note was copied from a baby's chart. Lactation Consultation Note  Patient Name: Chelsea Burgess [id number] Date: 12/01/2018   [place] was observed feeding with Enfamil Extra Slow-Flow nipple. Infant did not cough or have formula come out the sides of his mouth, but it was observed that he gulped at the beginning of the feeding, but then seemed to pace himself with softer swallows. Total intake after 8 minutes was 7 mL. Ankyloglossia may be a component of infant's less-than-ideal intake  Leretha Dykes, SLP was given an update & she will come by for the next feeding. I let the Mom & RN know to expect her.   Mom has not yet pumped with the new flanges.   Matthias Hughs [facility] 12/01/2018, 1:51 PM

## 2018-12-01 NOTE — Lactation Note (Signed)
This note was copied from a baby's chart. Lactation Consultation Note  Patient Name: Chelsea Burgess [id number] Date: 12/01/2018   Mom has been using the Enfamil slow-flow nipple & says she has noted that infant will sometimes cough or choke with bottle feeding. The Similac slow-flow nipple was used. No hard swallowing, coughing or choking was noted, but a little bit of formula was noted coming out of the sides of his mouth. Mom to call me next time infant feeds so that I can observe "Chelsea Burgess" with the Enfamil Extra Slow-Flow nipple.   Infant is 6 hrs old & Mom does not detect any additional breast heaviness since Chelsea Burgess was born. Hand expression was taught to Mom, with no result. I provided Mom with size 21 flanges for pumping.    Matthias Hughs [facility] 12/01/2018, 11:30 AM

## 2018-12-02 ENCOUNTER — Other Ambulatory Visit (HOSPITAL_COMMUNITY)
Admission: RE | Admit: 2018-12-02 | Discharge: 2018-12-02 | Disposition: A | Discharge status: Home / Self Care | Payer: Medicaid Other | Source: Ambulatory Visit | Attending: Internal Medicine | Admitting: Internal Medicine

## 2018-12-02 ENCOUNTER — Ambulatory Visit: Payer: Self-pay

## 2018-12-02 NOTE — Lactation Note (Signed)
This note was copied from a baby's chart. Lactation Consultation Note  Patient Name: Chelsea Burgess Date: 12/02/2018 Reason for consult: Follow-up assessment;Term;Primapara;1st time breastfeeding;Infant < 6lbs  P1 mother whose infant is now 90 hours old.  Mother's feeding preference has changed to formula only.  Baby was showing feeding cues when I arrived and mother was getting ready to bottle feed.  She is using Dr Saul Fordyce bottle with Similac 22 cal.  Observed mother feeding and provided tips for getting him to awaken by unswaddling and stimulating prior to feeding.  Showed mother how to place baby's lips at the base of the nipple and how to observe for distress in baby.  He began rhythmic sucking and paced nicely.  Mother stated that he usually takes a few sucks and then falls asleep.  I showed her how to gently stimulate to keep him sucking.  Educated her on the importance of increasing his feeding volumes.  She was not aware that he could "take more" because she did not want to "overfeed" him.  When questioned, she informed me that he usually wants to eat every hour.  Provided feeding guidelines and she will begin allowing baby to have more so he does not awaken every hour to feed.  Also demonstrated how burping is essential.  When baby stopped feeding I demonstrated effective burping techniques.  After a large burp she observed how he eagerly went back to the bottle and continued feeding.  Mother's breasts are soft and non tender and she stated they do not feel any heavier or fuller today.  Discussed engorgement prevention/treatment should this occur.  She has our OP phone number for questions/concerns after discharge. Mother will call me prior to discharge for any further concerns.  Father present.    Maternal Data Formula Feeding for Exclusion: Yes  Feeding Feeding Type: Bottle Fed - Formula Nipple Type: Dr. Clement Husbands  [place] Score                    Interventions    Lactation Tools Discussed/Used     Consult Status Consult Status: Complete Date: 12/02/18 Follow-up type: Call as needed    Chelsea Burgess R Jancarlo Biermann 12/02/2018, 9:10 AM

## 2018-12-03 ENCOUNTER — Encounter: Payer: Medicaid Other | Admitting: Women's Health

## 2018-12-03 LAB — TYPE AND SCREEN
ABO/RH(D): O NEG
Antibody Screen: POSITIVE
Unit division: 0
Unit division: 0

## 2018-12-03 LAB — BPAM RBC
Blood Product Expiration Date: 202006192359
Blood Product Expiration Date: 202006202359
ISSUE DATE / TIME: 202005212118
Unit Type and Rh: 9500
Unit Type and Rh: 9500

## 2018-12-04 ENCOUNTER — Inpatient Hospital Stay (HOSPITAL_COMMUNITY): Payer: Medicaid Other

## 2018-12-04 ENCOUNTER — Inpatient Hospital Stay (HOSPITAL_COMMUNITY)
Admission: AD | Admit: 2018-12-04 | Payer: Medicaid Other | Source: Home / Self Care | Admitting: Obstetrics and Gynecology

## 2019-01-06 ENCOUNTER — Encounter: Payer: Self-pay | Admitting: Women's Health

## 2019-01-06 ENCOUNTER — Other Ambulatory Visit: Payer: Self-pay

## 2019-01-06 ENCOUNTER — Encounter: Payer: Medicaid Other | Admitting: Women's Health

## 2019-01-06 NOTE — Progress Notes (Signed)
Pt for postpartum webex visit. Nurse did her part, then pt had to disconnect from [facility] call prior to meeting w/ provider. Unable to get her back on phone/webex. Will reschedule.    This encounter was created in error - please disregard.

## 2019-01-18 ENCOUNTER — Emergency Department (HOSPITAL_COMMUNITY)
Admission: EM | Admit: 2019-01-18 | Discharge: 2019-01-18 | Disposition: A | Discharge status: Home / Self Care | Payer: Medicaid Other | Attending: Emergency Medicine | Admitting: Emergency Medicine

## 2019-01-18 ENCOUNTER — Other Ambulatory Visit: Payer: Self-pay

## 2019-01-18 ENCOUNTER — Encounter (HOSPITAL_COMMUNITY): Payer: Self-pay | Admitting: Emergency Medicine

## 2019-01-18 ENCOUNTER — Emergency Department (HOSPITAL_COMMUNITY): Payer: Medicaid Other

## 2019-01-18 DIAGNOSIS — R82998 Other abnormal findings in urine: Secondary | ICD-10-CM | POA: Insufficient documentation

## 2019-01-18 DIAGNOSIS — N3000 Acute cystitis without hematuria: Secondary | ICD-10-CM

## 2019-01-18 DIAGNOSIS — R1011 Right upper quadrant pain: Secondary | ICD-10-CM

## 2019-01-18 LAB — CBC WITH DIFFERENTIAL/PLATELET
Abs Immature Granulocytes: 0.04 10*3/uL (ref 0.00–0.07)
Basophils Absolute: 0 10*3/uL (ref 0.0–0.1)
Basophils Relative: 0 %
Eosinophils Absolute: 0 10*3/uL (ref 0.0–0.5)
Eosinophils Relative: 0 %
HCT: 40.3 % (ref 36.0–46.0)
Hemoglobin: 12.8 g/dL (ref 12.0–15.0)
Immature Granulocytes: 0 %
Lymphocytes Relative: 14 %
Lymphs Abs: 1.5 10*3/uL (ref 0.7–4.0)
MCH: 28 pg (ref 26.0–34.0)
MCHC: 31.8 g/dL (ref 30.0–36.0)
MCV: 88.2 fL (ref 80.0–100.0)
Monocytes Absolute: 1.6 10*3/uL — ABNORMAL HIGH (ref 0.1–1.0)
Monocytes Relative: 14 %
Neutro Abs: 8.1 10*3/uL — ABNORMAL HIGH (ref 1.7–7.7)
Neutrophils Relative %: 72 %
Platelets: 230 10*3/uL (ref 150–400)
RBC: 4.57 MIL/uL (ref 3.87–5.11)
RDW: 15.4 % (ref 11.5–15.5)
WBC: 11.3 10*3/uL — ABNORMAL HIGH (ref 4.0–10.5)
nRBC: 0 % (ref 0.0–0.2)

## 2019-01-18 LAB — COMPREHENSIVE METABOLIC PANEL
ALT: 13 U/L (ref 0–44)
AST: 15 U/L (ref 15–41)
Albumin: 3.4 g/dL — ABNORMAL LOW (ref 3.5–5.0)
Alkaline Phosphatase: 80 U/L (ref 38–126)
Anion gap: 9 (ref 5–15)
BUN: 9 mg/dL (ref 6–20)
CO2: 22 mmol/L (ref 22–32)
Calcium: 9 mg/dL (ref 8.9–10.3)
Chloride: 107 mmol/L (ref 98–111)
Creatinine, Ser: 0.65 mg/dL (ref 0.44–1.00)
GFR calc Af Amer: >60 mL/min (ref >60)
GFR calc non Af Amer: >60 mL/min (ref >60)
Glucose, Bld: 96 mg/dL (ref 70–99)
Potassium: 3.6 mmol/L (ref 3.5–5.1)
Sodium: 138 mmol/L (ref 135–145)
Total Bilirubin: 0.8 mg/dL (ref 0.3–1.2)
Total Protein: 6.6 g/dL (ref 6.5–8.1)

## 2019-01-18 LAB — URINALYSIS, ROUTINE W REFLEX MICROSCOPIC
Bilirubin Urine: NEGATIVE
Glucose, UA: NEGATIVE mg/dL
Hgb urine dipstick: NEGATIVE
Ketones, ur: NEGATIVE mg/dL
Nitrite: NEGATIVE
Protein, ur: 30 mg/dL — AB
Specific Gravity, Urine: 1.015 (ref 1.005–1.030)
WBC, UA: >50 WBC/hpf — ABNORMAL HIGH (ref 0–5)
pH: 6 (ref 5.0–8.0)

## 2019-01-18 LAB — LIPASE, BLOOD: Lipase: 24 U/L (ref 11–51)

## 2019-01-18 MED ORDER — ONDANSETRON 4 MG PO TBDP: 4.0000 mg | ORAL_TABLET | Freq: Three times a day (TID) | ORAL | 0 refills | Status: DC | PRN

## 2019-01-18 MED ORDER — CEPHALEXIN 250 MG PO CAPS: 250.0000 mg | ORAL_CAPSULE | Freq: Four times a day (QID) | ORAL | 0 refills | Status: DC

## 2019-01-18 MED ORDER — MORPHINE SULFATE (PF) 4 MG/ML IV SOLN
4.0000 mg | Freq: Once | INTRAVENOUS | Status: AC
  Administered 2019-01-18: 4 mg via INTRAVENOUS
  Filled 2019-01-18: qty 1

## 2019-01-18 NOTE — ED Triage Notes (Signed)
Pt complains of right sided abdominal pain. Pt states it started last night. Pt had a recent epidural. Pt states she has no history of kidney stones.

## 2019-01-18 NOTE — Discharge Instructions (Addendum)
Take antibiotics as prescribed. Take the entire course, even if your symptoms improve.  Use Zofran as needed for nausea or vomiting.  This will dissolve under your tongue. Use Tylenol or ibuprofen as needed for pain. Make sure you are staying well-hydrated water. Follow-up with your primary care doctor if your symptoms not improving. Soak your foot in soapy water 3 times a day. If it becomes more painful or looks infected, follow up with the foot doctor.  Return to emergency room if you develop high fevers, severe/persistent vomiting, worsening abdominal pain, any new, worsening, concerning symptoms.

## 2019-01-18 NOTE — ED Notes (Signed)
Patient is resting comfortably. 

## 2019-01-18 NOTE — ED Provider Notes (Signed)
[facility] Provider Note   CSN: [id number] Arrival date & time: 01/18/19  1145     History   Chief Complaint No chief complaint on file.   HPI Chelsea QuamKourtney K Burgess is a 23 y.o. female presenting for evaluation of abd pain.   Pt states for the past 4 days she has been having all over midline back pain (thought it was from the epidural). That resolved las night, but then she started to have RUQ abd pain.  This is been present and constant since last night.  Pain worsens when she tried to drink some this morning.  She has not had any solid foods.  She also reports urinary symptoms for the past 4 days, stating that her urine smells abnormal.  She denies dysuria or hematuria.  Her urine symptoms began before her back pain.  She denies fevers, chills, chest pain, shortness of breath, cough, nausea, vomiting, abnormal bowel movements.  She states she has no history of abdominal problems, has never had any abdominal surgeries.  She had a vaginal delivery 11/29/2018 without complications.  She resumed sexual intercourse last week, did not have any pain with intercourse.  She denies vaginal bleeding or vaginal discharge.  She denies sick contacts.  Patient states she has no medical problems, takes no medications daily.  She is bottle-feeding exclusively.  She has not taken anything for pain.    HPI  Past Medical History:  Diagnosis Date  . Herpes     Patient Active Problem List   Diagnosis Date Noted  . Abnormal Pap smear of cervix 06/27/2018  . Supervision of normal pregnancy 06/19/2018    Past Surgical History:  Procedure Laterality Date  . NO PAST SURGERIES       OB History    Gravida  2   Para  1   Term  1   Preterm      AB  1   Living  1     SAB  1   TAB      Ectopic      Multiple  0   Live Births  1            Home Medications    Prior to Admission medications   Medication Sig Start Date End Date Taking? Authorizing  Provider  cephALEXin (KEFLEX) 250 MG capsule Take 1 capsule (250 mg total) by mouth 4 (four) times daily for 7 days. 01/18/19 01/25/19  Roselani Grajeda, PA-C  ibuprofen (ADVIL) 100 MG/5ML suspension Take 30 mLs (600 mg total) by mouth every 6 (six) hours. Patient not taking: Reported on 01/06/2019 12/01/18   Leftwich-Kirby, Misty StanleyLisa A, CNM  ondansetron (ZOFRAN ODT) 4 MG disintegrating tablet Take 1 tablet (4 mg total) by mouth every 8 (eight) hours as needed for nausea or vomiting. 01/18/19   Belvin Gauss, PA-C  Prenatal MV-Min-FA-Omega-3 (PRENATAL GUMMIES/DHA & FA PO) Take by mouth.    [provider]    Family History Family History  Problem Relation Age of Onset  . Stroke Mother   . Heart attack Mother   . Heart attack Father   . Stroke Maternal Aunt   . Diabetes Maternal Grandmother   . Cancer Maternal Grandfather        colon    Social History Social History   Tobacco Use  . Smoking status: Never Smoker  . Smokeless tobacco: Never Used  Substance Use Topics  . Alcohol use: Not Currently  . Drug use: Never  Allergies   Patient has no known allergies.   Review of Systems Review of Systems  Gastrointestinal: Positive for abdominal pain.  Genitourinary:       Abnormal urine smell  Musculoskeletal: Positive for back pain (resolved).  All other systems reviewed and are negative.    Physical Exam Updated Vital Signs BP 122/76 (BP Location: Right Arm)   Pulse 100   Temp 99.2 F (37.3 C) (Oral)   Resp 16   Ht 5\' 7"  (1.702 m)   Wt 59.9 kg   SpO2 100%   BMI 20.67 kg/m   Physical Exam Vitals signs and nursing note reviewed.  Constitutional:      General: She is not in acute distress.    Appearance: She is well-developed.  HENT:     Head: Normocephalic and atraumatic.  Eyes:     Conjunctiva/sclera: Conjunctivae normal.     Pupils: Pupils are equal, round, and reactive to light.  Neck:     Musculoskeletal: Normal range of motion and neck supple.   Cardiovascular:     Rate and Rhythm: Normal rate and regular rhythm.     Pulses: Normal pulses.  Pulmonary:     Effort: Pulmonary effort is normal. No respiratory distress.     Breath sounds: Normal breath sounds. No wheezing.  Abdominal:     General: There is no distension.     Palpations: Abdomen is soft. There is no mass.     Tenderness: There is abdominal tenderness. There is no right CVA tenderness, left CVA tenderness, guarding or rebound.     Comments: Tenderness palpation of epigastric and right upper quadrant abdomen.  Soft without rigidity, guarding, distention.  Negative rebound.  Negative Murphy's.  No signs of peritonitis.  No CVA tenderness.  Musculoskeletal: Normal range of motion.  Skin:    General: Skin is warm and dry.     Capillary Refill: Capillary refill takes less than 2 seconds.  Neurological:     Mental Status: She is alert and oriented to person, place, and time.      ED Treatments / Results  Labs (all labs ordered are listed, but only abnormal results are displayed) Labs Reviewed  CBC WITH DIFFERENTIAL/PLATELET - Abnormal; Notable for the following components:      Result Value   WBC 11.3 (*)    Neutro Abs 8.1 (*)    Monocytes Absolute 1.6 (*)    All other components within normal limits  COMPREHENSIVE METABOLIC PANEL - Abnormal; Notable for the following components:   Albumin 3.4 (*)    All other components within normal limits  URINALYSIS, ROUTINE W REFLEX MICROSCOPIC - Abnormal; Notable for the following components:   APPearance HAZY (*)    Protein, ur 30 (*)    Leukocytes,Ua MODERATE (*)    WBC, UA >50 (*)    Bacteria, UA MANY (*)    All other components within normal limits  LIPASE, BLOOD    EKG None  Radiology US Abdomen Limited Ruq  Result Date: 01/18/2019 CLINICAL DATA:  Right upper quadrant pain. EXAM: ULTRASOUND ABDOMEN LIMITED RIGHT UPPER QUADRANT COMPARISON:  None. FINDINGS: Gallbladder: No gallstones or wall thickening  visualized. No sonographic Murphy sign noted by sonographer. Common bile duct: Diameter: 2.7 mm, normal. Liver: No focal lesion identified. Within normal limits in parenchymal echogenicity. Portal vein is patent on color Doppler imaging with normal direction of blood flow towards the liver. IMPRESSION: Normal exam. Electronically Signed   By: Lorriane Shire M.D.   On:  01/18/2019 13:17    Procedures Procedures (including critical care time)  Medications Ordered in ED Medications  morphine 4 MG/ML injection 4 mg (4 mg Intravenous Given 01/18/19 1230)     Initial Impression / Assessment and Plan / ED Course  I have reviewed the triage vital signs and the nursing notes.  Pertinent labs & imaging results that were available during my care of the patient were reviewed by me and considered in my medical decision making (see chart for details).        Patient presenting for evaluation of right upper quadrant abdominal pain and urinary symptoms.  Physical exam reassuring, she appears nontoxic.  Consider gallbladder etiology based on location of pain.  Also consider UTI.  Low suspicion for Pilo or kidney stone, as she has no CVA tenderness.  Will obtain labs, right upper quadrant ultrasound, urine, and reassess.  Morphine for pain.  On reassessment, patient reports pain is improved with morphine.  Right upper quadrant negative for cholecystitis or gallstones.  Labs with slight leukocytosis at 11.3.  Otherwise reassuring.  Urine with moderate leuks, greater than 50 white cells, and many bacteria.  No blood.  Doubt kidney stone.  Likely UTI.  Will treat with Keflex.  Zofran for nausea.  Discussed findings with patient, who is agreeable to plan.  Patient informed RN that she stepped on glass and would like her foot examined.  Patient states 2 days ago she stepped on a piece of glass, she pulled some of it out of her L heel but has a small sliver left that she can fele she when walks. On exam, there is no  obvious foreign body. No erythema or swelling. Offered xray for evaluation and attempted removal, pt declines. Discussed symptomatic tx and f/uy with podiatry as needed.   At this time, pt appears safe for d/c. Return precautions given. Pt states she understands and agrees to plan.   Final Clinical Impressions(s) / ED Diagnoses   Final diagnoses:  RUQ abdominal pain  Acute cystitis without hematuria    ED Discharge Orders         Ordered    cephALEXin (KEFLEX) 250 MG capsule  4 times daily     01/18/19 1401    ondansetron (ZOFRAN ODT) 4 MG disintegrating tablet  Every 8 hours PRN     01/18/19 1401           Bernis Stecher, PA-C 01/18/19 1425    Margarita Grizzleay, Danielle, MD 01/18/19 1541

## 2019-01-18 NOTE — ED Notes (Signed)
Patient transported to Ultrasound 

## 2019-01-19 ENCOUNTER — Ambulatory Visit: Payer: Medicaid Other | Admitting: Adult Health

## 2019-01-21 ENCOUNTER — Other Ambulatory Visit: Payer: Self-pay

## 2019-01-21 ENCOUNTER — Telehealth (INDEPENDENT_AMBULATORY_CARE_PROVIDER_SITE_OTHER): Payer: Medicaid Other | Admitting: Advanced Practice Midwife

## 2019-01-21 NOTE — Patient Instructions (Signed)
[place]/Family Doctors:  [employer] [phone]                 [place] [phone] (usually not accepting new patients unless you have family there already, you are always welcome to call and ask)       [facility] Department [phone]       [place] Pediatricians/Family Doctors:   [facility]: [phone]  [facility]: [place] Doctors:   [facility]: [place] [facility]: [place]:  [place]: [phone]

## 2019-01-21 NOTE — Progress Notes (Signed)
TELEHEALTH VIRTUAL POSTPARTUM VISIT ENCOUNTER NOTE  I connected with@ on 01/21/19 at  3:30 PM EDT by telephone at home and verified that I am speaking with the correct person using two identifiers.   I discussed the limitations, risks, security and privacy concerns of performing an evaluation and management service by telephone and the availability of in person appointments. I also discussed with the patient that there may be a patient responsible charge related to this service. The patient expressed understanding and agreed to proceed.  Appointment Date: 01/21/2019  OBGYN Clinic: [facility]  Chief Complaint:  Postpartum Visit  History of Present Illness: CHANNING SAVICH is a 23 y.o.  G2P1011 (Patient's last menstrual period was 01/19/2019 (exact date).), seen for the above chief complaint.   She is s/p normal spontaneous vaginal delivery on 6.6 at 40.3 weeks; she was discharged to home on PPD#2. Pregnancy complicated by postdates. Baby is doing well.   Vaginal bleeding or discharge: started period 2 days Mode of feeding infant: Bottle Intercourse: Yes  Contraception: condoms PP depression s/s: No .  Any bowel or bladder issues: No  Pap smear: ASCUS with POSITIVE high risk HPV (date: 06/19/18)  Review of Systems: Positive for back pain where epidural was.  Seen in ED a few nights ago and dx w/UTI, on ABX. Her 12 point review of systems is negative or as noted in the History of Present Illness.  Patient Active Problem List   Diagnosis Date Noted  . Abnormal Pap smear of cervix 06/27/2018  . Supervision of normal pregnancy 06/19/2018    Medications Breea K. Thornley had no medications administered during this visit. Current Outpatient Medications  Medication Sig Dispense Refill  . Cephalexin 250 MG tablet     . ibuprofen (ADVIL) 100 MG/5ML suspension Take 30 mLs (600 mg total) by mouth every 6 (six) hours. (Patient not taking: Reported on 01/06/2019) 273 mL 0  .  ondansetron (ZOFRAN ODT) 4 MG disintegrating tablet Take 1 tablet (4 mg total) by mouth every 8 (eight) hours as needed for nausea or vomiting. (Patient not taking: Reported on 01/21/2019) 15 tablet 0  . Prenatal MV-Min-FA-Omega-3 (PRENATAL GUMMIES/DHA & FA PO) Take by mouth.     No current facility-administered medications for this visit.     Allergies Patient has no known allergies.  Physical Exam:  General:  Alert, oriented and cooperative.   Mental Status: Normal mood and affect perceived. Normal judgment and thought content.  Rest of physical exam deferred due to type of encounter  PP Depression Screening:   Edinburgh Postnatal Depression Scale - 01/21/19 1529      Edinburgh Postnatal Depression Scale:  In the Past 7 Days   I have been able to laugh and see the funny side of things.  0    I have looked forward with enjoyment to things.  0    I have blamed myself unnecessarily when things went wrong.  0    I have been anxious or worried for no good reason.  0    I have felt scared or panicky for no good reason.  0    Things have been getting on top of me.  0    I have been so unhappy that I have had difficulty sleeping.  0    I have felt sad or miserable.  0    I have been so unhappy that I have been crying.  0    The thought of harming myself has  occurred to me.  0    Edinburgh Postnatal Depression Scale Total  0       Assessment:Patient is a 23 y.o. G2P1011 who is 7 weeks postpartum from a normal spontaneous vaginal delivery.  She is doing well.   Plan: Let me know if she wants any birth control. Condoms for now There are no diagnoses linked to this encounter.  RTC after Christmas for repeat pap  I discussed the assessment and treatment plan with the patient. The patient was provided an opportunity to ask questions and all were answered. The patient agreed with the plan and demonstrated an understanding of the instructions.   The patient was advised to call back or seek  an in-person evaluation/go to the ED for any concerning postpartum symptoms.  I provided 10 minutes of non-face-to-face time during this encounter.   Jacklyn ShellFrances Cresenzo-Dishmon, CNM [facility], [facility]

## 2019-05-31 ENCOUNTER — Telehealth: Payer: Medicaid Other | Admitting: Family

## 2019-05-31 DIAGNOSIS — R399 Unspecified symptoms and signs involving the genitourinary system: Secondary | ICD-10-CM | POA: Diagnosis not present

## 2019-05-31 MED ORDER — SULFAMETHOXAZOLE-TRIMETHOPRIM 800-160 MG PO TABS: 1.0000 | ORAL_TABLET | Freq: Two times a day (BID) | ORAL | 0 refills | Status: DC

## 2019-05-31 NOTE — Progress Notes (Signed)
We are sorry that you are not feeling well.  Here is how we plan to help!  Based on what you shared with me it looks like you most likely have a simple urinary tract infection.  A UTI (Urinary Tract Infection) is a bacterial infection of the bladder.  Most cases of urinary tract infections are simple to treat but a key part of your care is to encourage you to drink plenty of fluids and watch your symptoms carefully.  I have prescribed Bactrim DS One tablet twice a day for 5 days.  Your symptoms should gradually improve. Call us if the burning in your urine worsens, you develop worsening fever, back pain or pelvic pain or if your symptoms do not resolve after completing the antibiotic.  Urinary tract infections can be prevented by drinking plenty of water to keep your body hydrated.  Also be sure when you wipe, wipe from front to back and don't hold it in!  If possible, empty your bladder every 4 hours.  Your e-visit answers were reviewed by a board certified advanced clinical practitioner to complete your personal care plan.  Depending on the condition, your plan could have included both over the counter or prescription medications.  If there is a problem please reply  once you have received a response from your provider.  Your safety is important to us.  If you have drug allergies check your prescription carefully.    You can use MyChart to ask questions about today's visit, request a non-urgent call back, or ask for a work or school excuse for 24 hours related to this e-Visit. If it has been greater than 24 hours you will need to follow up with your provider, or enter a new e-Visit to address those concerns.   You will get an e-mail in the next two days asking about your experience.  I hope that your e-visit has been valuable and will speed your recovery. Thank you for using e-visits.   Approximately 5 minutes was spent documenting and reviewing patient's chart.   

## 2019-07-14 ENCOUNTER — Ambulatory Visit: Payer: Medicaid Other | Admitting: Adult Health

## 2019-10-24 IMAGING — CT CT RENAL STONE PROTOCOL
2 of 4 series · 16 of 46 positions shown, 18 images · non-contrast
Comparison: None.

CLINICAL DATA: Right flank pain.

EXAM:
CT ABDOMEN AND PELVIS WITHOUT CONTRAST
TECHNIQUE: Multidetector CT imaging of the abdomen and pelvis was performed
following the standard protocol without IV contrast.

[Series 3: ap without f_0.6 · axial · non-contrast · 0.70mm/px · z∈[+638,+1043]mm · 13 of 91 slices shown, 15 images]
[im 5/91  soft-tissue]
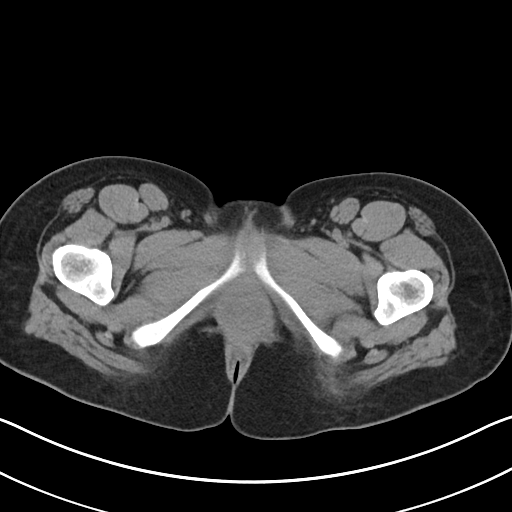
[im 5/91  bone]
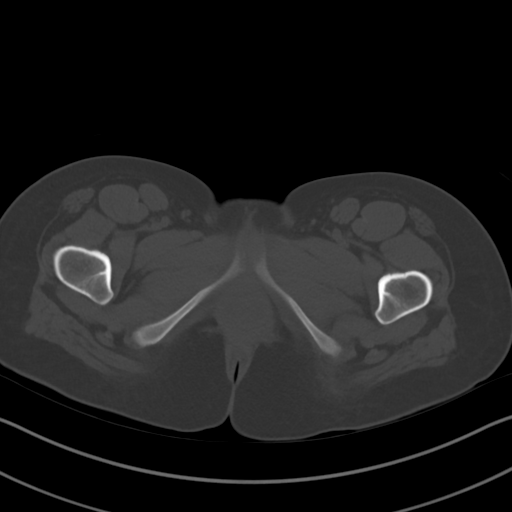
[im 13/91  soft-tissue]
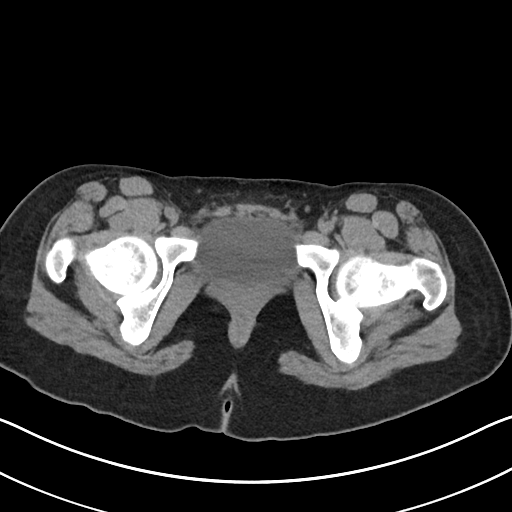
[im 21/91  soft-tissue]
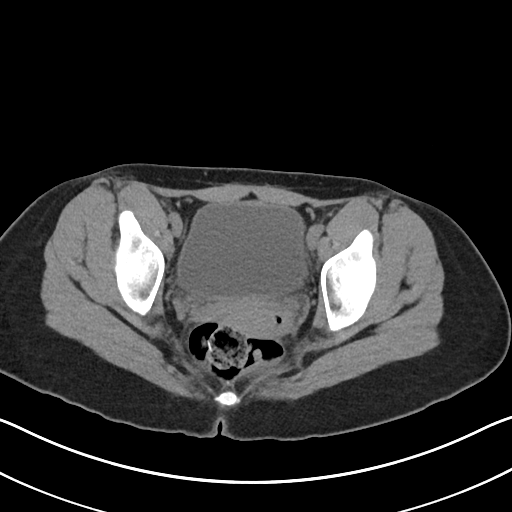
[im 25/91  soft-tissue]
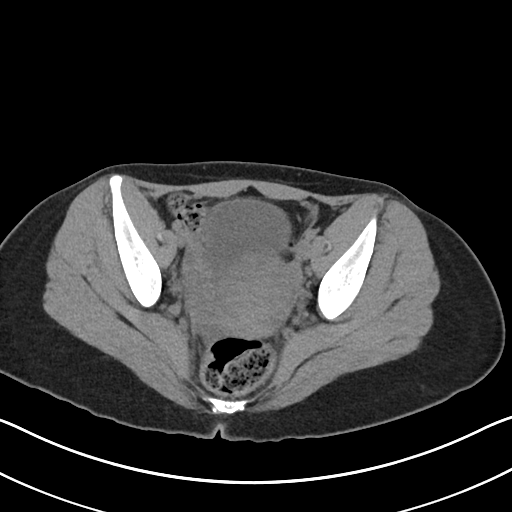
[im 33/91  soft-tissue]
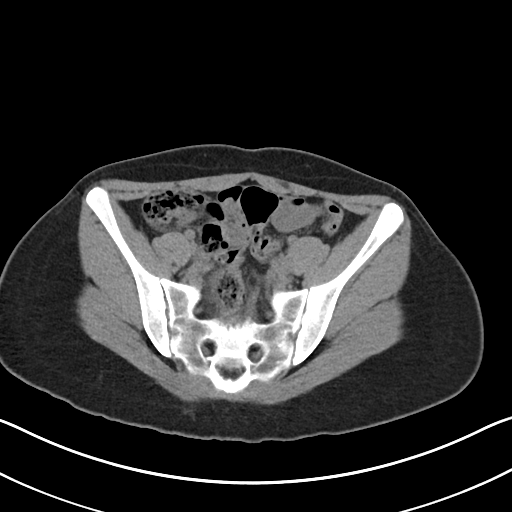
[im 37/91  soft-tissue]
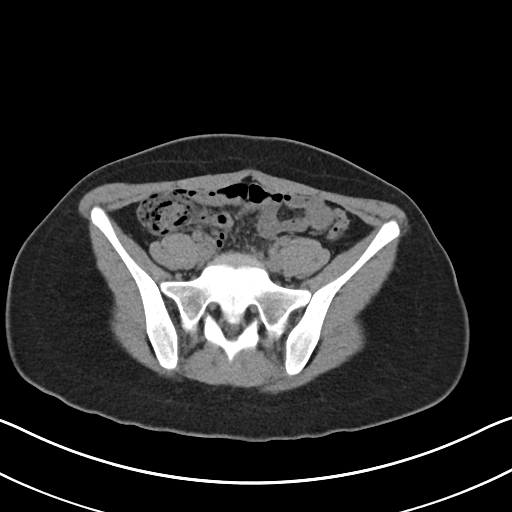
[im 46/91  soft-tissue]
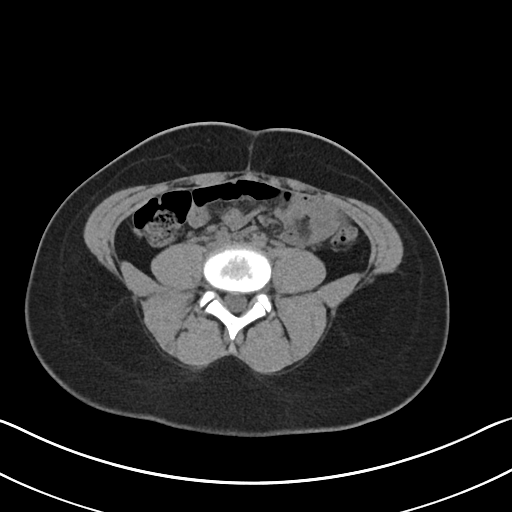
[im 54/91  soft-tissue]
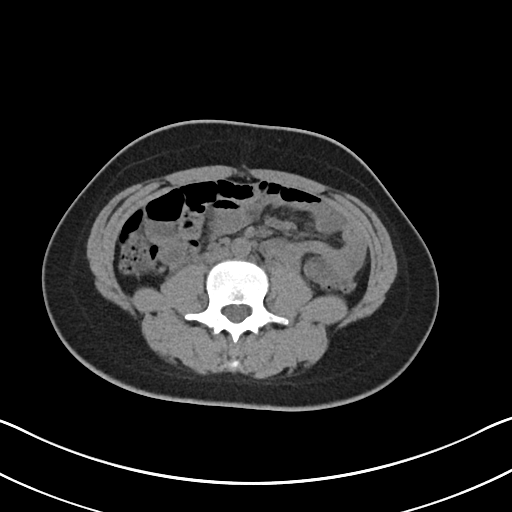
[im 58/91  soft-tissue]
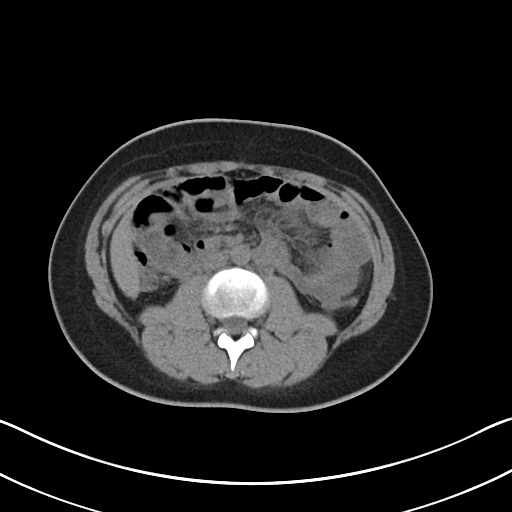
[im 58/91  bone]
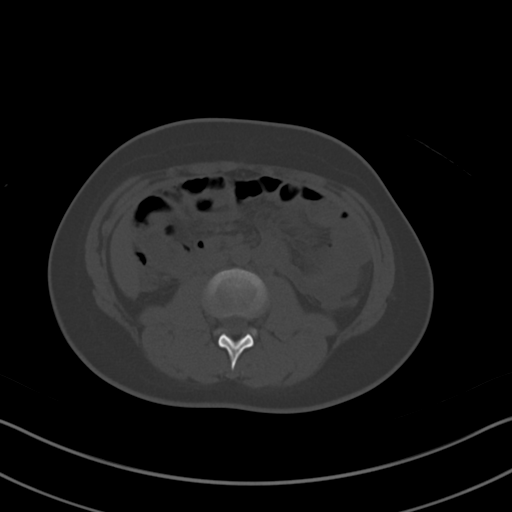
[im 66/91  soft-tissue]
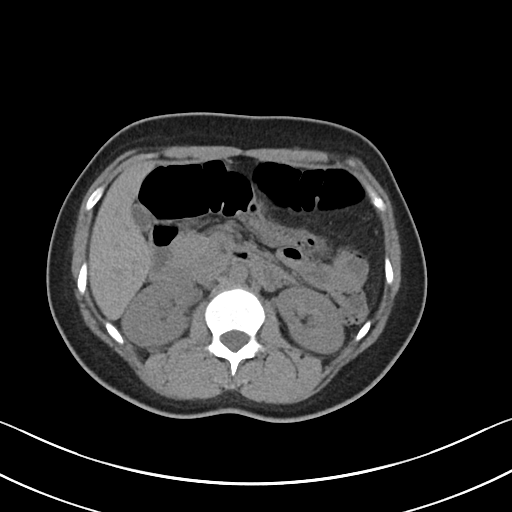
[im 70/91  soft-tissue]
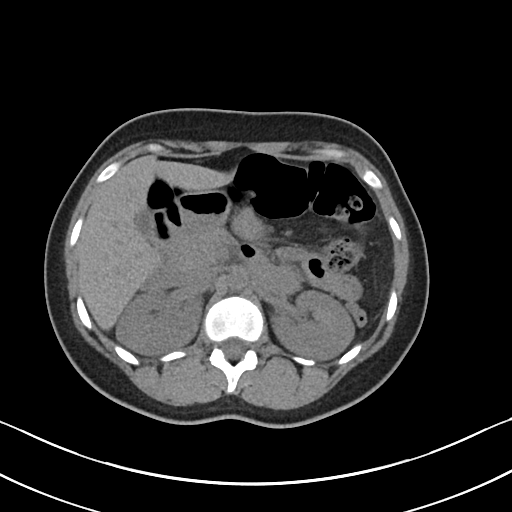
[im 78/91  soft-tissue]
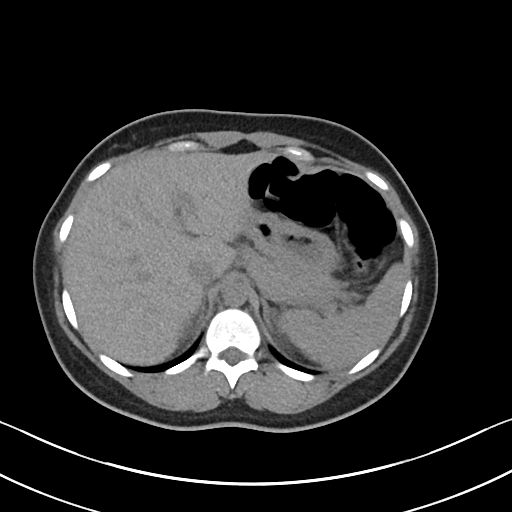
[im 86/91  soft-tissue]
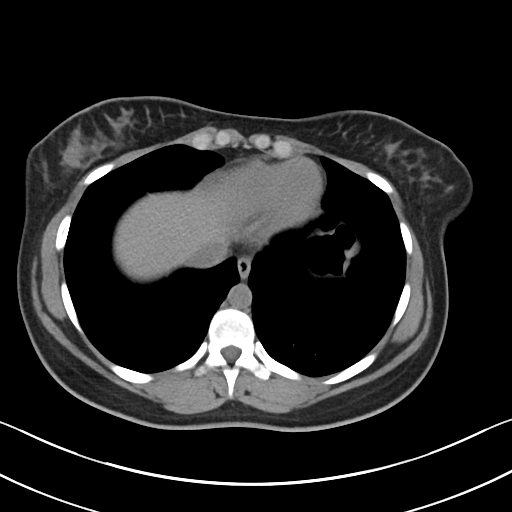

[Series 7: cor f_0.6 · coronal · 0.70mm/px · 3 of 70 slices shown]
[im 24/70  soft-tissue]
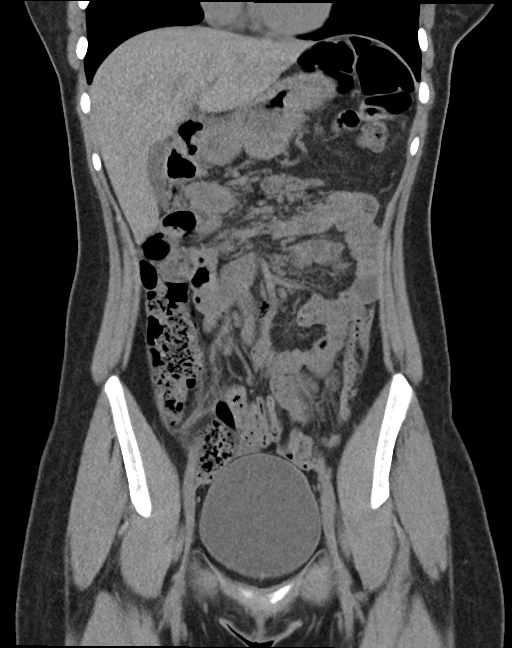
[im 31/70  soft-tissue]
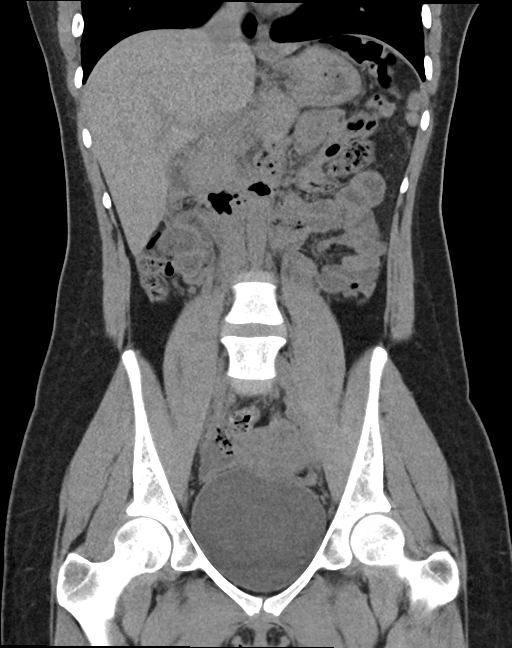
[im 39/70  soft-tissue]
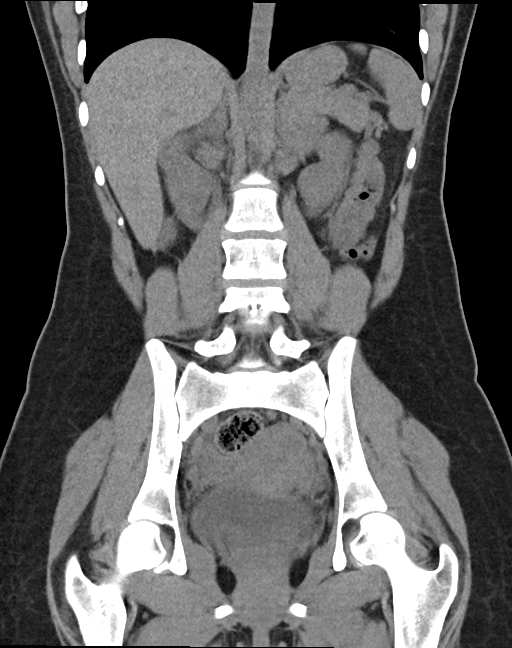

[16 of 46 positions shown; findings below may reference images not displayed]

FINDINGS: Lower chest: No acute abnormality.

Hepatobiliary: No focal liver abnormality is seen. No gallstones,
gallbladder wall thickening, or biliary dilatation.

Pancreas: Unremarkable. No pancreatic ductal dilatation or
surrounding inflammatory changes.

Spleen: Normal in size without focal abnormality.

Adrenals/Urinary Tract: Adrenal glands are unremarkable. Kidneys are
normal, without renal calculi, focal lesion, or hydronephrosis.
Bladder is unremarkable.

Stomach/Bowel: The stomach and small bowel are normal. The colon and
visualized portions of the appendix are normal. No secondary
evidence of appendicitis.

Vascular/Lymphatic: No significant vascular findings are present. No
enlarged abdominal or pelvic lymph nodes.

Reproductive: The uterus and left ovary are normal. No right ovarian
mass noted. There is a small amount of fluid posteriorly in the
right pelvis, likely posterior to the right ovary.

Other: There is a small amount of fluid in the pelvis, likely
posterior to the ovary on the right.

Musculoskeletal: No acute or significant osseous findings.
IMPRESSION: 1. There is a small amount of fluid in the right posterior pelvis,
likely posterior to the ovary. The patient's pain could be adnexal
in the appropriate clinical setting. If clinically warranted, a
pelvic ultrasound could better evaluate the ovaries.
2. Visualized portions of the appendix are normal. No evidence of
appendicitis.
3. No other acute abnormalities.

## 2019-12-03 ENCOUNTER — Encounter (HOSPITAL_COMMUNITY): Payer: Self-pay

## 2019-12-03 ENCOUNTER — Other Ambulatory Visit: Payer: Self-pay

## 2019-12-03 ENCOUNTER — Ambulatory Visit (HOSPITAL_COMMUNITY)
Admission: EM | Admit: 2019-12-03 | Discharge: 2019-12-03 | Disposition: A | Discharge status: Home / Self Care | Payer: Medicaid Other | Attending: Family Medicine | Admitting: Family Medicine

## 2019-12-03 DIAGNOSIS — R350 Frequency of micturition: Secondary | ICD-10-CM | POA: Diagnosis present

## 2019-12-03 DIAGNOSIS — Z113 Encounter for screening for infections with a predominantly sexual mode of transmission: Secondary | ICD-10-CM | POA: Insufficient documentation

## 2019-12-03 DIAGNOSIS — Z3202 Encounter for pregnancy test, result negative: Secondary | ICD-10-CM

## 2019-12-03 DIAGNOSIS — R109 Unspecified abdominal pain: Secondary | ICD-10-CM | POA: Diagnosis not present

## 2019-12-03 DIAGNOSIS — N898 Other specified noninflammatory disorders of vagina: Secondary | ICD-10-CM | POA: Diagnosis present

## 2019-12-03 LAB — POCT URINALYSIS DIP (DEVICE)
Bilirubin Urine: NEGATIVE
Glucose, UA: NEGATIVE mg/dL
Hgb urine dipstick: NEGATIVE
Ketones, ur: NEGATIVE mg/dL
Nitrite: NEGATIVE
Protein, ur: NEGATIVE mg/dL
Specific Gravity, Urine: >=1.03 (ref 1.005–1.030)
Urobilinogen, UA: 0.2 mg/dL (ref 0.0–1.0)
pH: 6 (ref 5.0–8.0)

## 2019-12-03 LAB — POC URINE PREG, ED: Preg Test, Ur: NEGATIVE

## 2019-12-03 MED ORDER — CEPHALEXIN 500 MG PO CAPS
500.0000 mg | ORAL_CAPSULE | Freq: Two times a day (BID) | ORAL | 0 refills | Status: DC
Start: 2019-12-03 — End: 2020-02-01

## 2019-12-03 NOTE — ED Notes (Signed)
I accessed patient's char due to Patient was discharged before paper orders were obtained for the cytology specimen

## 2019-12-03 NOTE — ED Triage Notes (Addendum)
Pt is here with abdominal pain that started 3 days ago, pt has not taken any meds to relieve discomfort. Pt does not want COVID testing.

## 2019-12-03 NOTE — ED Provider Notes (Signed)
[facility]    ASSESSMENT & PLAN:  1. Urinary frequency   2. Vaginal irritation   3. Screening for STDs (sexually transmitted diseases)     Will tx empirically for cystitis. Urine culture and vaginal cytology pending.  Meds ordered this encounter  Medications  . cephALEXin (KEFLEX) 500 MG capsule    Sig: Take 1 capsule (500 mg total) by mouth 2 (two) times daily.    Dispense:  10 capsule    Refill:  0    No signs of pyelonephritis. Discussed.   Follow-up Information    [facility]   Specialty: Urgent Care Why: If worsening or failing to improve as anticipated. Contact information: [address] [place] [address] [phone]               Outlined signs and symptoms indicating need for more acute intervention. Patient verbalized understanding. After Visit Summary given.  SUBJECTIVE:  Chelsea Burgess is a 24 y.o. female who complains of urinary frequency, urgency as well as vaginal irritation. Abrupt onset approx 3 d ago. Without associated flank pain, fever, chills, specific vaginal discharge or bleeding. Gross hematuria: not present. No specific aggravating or alleviating factors reported. No LE edema. Normal PO intake without n/v/d. Without specific abdominal pain. Ambulatory without difficulty. OTC treatment: none. H/O UTI: yes with similar symptoms. Is sexually active with one female partner.  LMP: Patient's last menstrual period was 11/12/2019 (exact date).    OBJECTIVE:  General appearance: alert; no distress Lungs: unlabored respirations Abdomen: no tenderness reported GU: deferred Extremities: no edema; symmetrical with no gross deformities Skin: warm and dry Neurologic: normal gait Psychological: alert and cooperative; normal mood and affect  Labs Reviewed  POCT URINALYSIS DIP (DEVICE) - Abnormal; Notable for the following components:      Result Value   Leukocytes,Ua TRACE  (*)    All other components within normal limits  URINE CULTURE  POC URINE PREG, ED  CERVICOVAGINAL ANCILLARY ONLY    No Known Allergies  Past Medical History:  Diagnosis Date  . Herpes    Social History   Socioeconomic History  . Marital status: Single    Spouse name: Not on file  . Number of children: 1  . Years of education: Not on file  . Highest education level: Not on file  Occupational History  . Not on file  Tobacco Use  . Smoking status: Never Smoker  . Smokeless tobacco: Never Used  Vaping Use  . Vaping Use: Never used  Substance and Sexual Activity  . Alcohol use: Not Currently  . Drug use: Never  . Sexual activity: Yes    Birth control/protection: None  Other Topics Concern  . Not on file  Social History Narrative   ** Merged History Encounter **       Social Determinants of Health   Financial Resource Strain:   . Difficulty of Paying Living Expenses:   Food Insecurity:   . Worried About Programme researcher, broadcasting/film/video in the Last Year:   . Barista in the Last Year:   Transportation Needs:   . Freight forwarder (Medical):   Marland Kitchen Lack of Transportation (Non-Medical):   Physical Activity:   . Days of Exercise per Week:   . Minutes of Exercise per Session:   Stress:   . Feeling of Stress :   Social Connections:   . Frequency of Communication with Friends and Family:   .  Frequency of Social Gatherings with Friends and Family:   . Attends Religious Services:   . Active Member of Clubs or Organizations:   . Attends Archivist Meetings:   Marland Kitchen Marital Status:   Intimate Partner Violence:   . Fear of Current or Ex-Partner:   . Emotionally Abused:   Marland Kitchen Physically Abused:   . Sexually Abused:    Family History  Problem Relation Age of Onset  . Stroke Mother   . Heart attack Mother   . Heart attack Father   . Stroke Maternal Aunt   . Diabetes Maternal Grandmother   . Cancer Maternal Grandfather        colon       Vanessa Kick,  MD 12/03/19 1058

## 2019-12-03 NOTE — Discharge Instructions (Signed)
We have sent testing for sexually transmitted infections as well as a culture of your urine sample. We will notify you of any positive results once they are received. If required, we will prescribe any medications you might need.  Please refrain from all sexual activity for at least the next seven days.

## 2019-12-04 ENCOUNTER — Telehealth (HOSPITAL_COMMUNITY): Payer: Self-pay | Admitting: Orthopedic Surgery

## 2019-12-04 LAB — CERVICOVAGINAL ANCILLARY ONLY
Bacterial Vaginitis (gardnerella): POSITIVE — AB
Candida Glabrata: NEGATIVE
Candida Vaginitis: POSITIVE — AB
Chlamydia: POSITIVE — AB
Comment: NEGATIVE
Comment: NEGATIVE
Comment: NEGATIVE
Comment: NEGATIVE
Comment: NEGATIVE
Comment: NORMAL
Neisseria Gonorrhea: NEGATIVE
Trichomonas: NEGATIVE

## 2019-12-04 MED ORDER — METRONIDAZOLE 500 MG PO TABS
500.0000 mg | ORAL_TABLET | Freq: Two times a day (BID) | ORAL | 0 refills | Status: DC
Start: 2019-12-04 — End: 2020-02-01

## 2019-12-04 MED ORDER — FLUCONAZOLE 150 MG PO TABS
150.0000 mg | ORAL_TABLET | Freq: Every day | ORAL | 0 refills | Status: AC
Start: 2019-12-04 — End: 2019-12-06

## 2019-12-04 MED ORDER — AZITHROMYCIN 250 MG PO TABS
1000.0000 mg | ORAL_TABLET | Freq: Once | ORAL | 0 refills | Status: AC
Start: 2019-12-04 — End: 2019-12-04

## 2019-12-05 LAB — URINE CULTURE: Culture: >=100000 — AB

## 2020-02-01 ENCOUNTER — Ambulatory Visit (INDEPENDENT_AMBULATORY_CARE_PROVIDER_SITE_OTHER): Payer: Medicaid Other | Admitting: *Deleted

## 2020-02-01 ENCOUNTER — Encounter: Payer: Self-pay | Admitting: *Deleted

## 2020-02-01 DIAGNOSIS — N926 Irregular menstruation, unspecified: Secondary | ICD-10-CM | POA: Diagnosis not present

## 2020-02-01 LAB — POCT URINE PREGNANCY: Preg Test, Ur: POSITIVE — AB

## 2020-02-01 NOTE — Progress Notes (Signed)
Chart reviewed for nurse visit. Agree with plan of care.  Adline Potter, NP 02/01/2020 10:32 AM

## 2020-02-01 NOTE — Progress Notes (Signed)
   NURSE VISIT- PREGNANCY CONFIRMATION   SUBJECTIVE:  Chelsea Burgess is a 24 y.o. G69P1011 female at [redacted]w[redacted]d by certain LMP of Patient's last menstrual period was 12/09/2019 (exact date). Here for pregnancy confirmation.  Home pregnancy test: positive x 1  She reports no complaints.  She is not taking prenatal vitamins.    OBJECTIVE:  LMP 12/09/2019 (Exact Date)   Breastfeeding No   Appears well, in no apparent distress OB History  Gravida Para Term Preterm AB Living  3 1 1   1 1   SAB TAB Ectopic Multiple Live Births  1     0 1    # Outcome Date GA Lbr Len/2nd Weight Sex Delivery Anes PTL Lv  3 Current           2 Term 11/29/18 [redacted]w[redacted]d 05:46 / 02:07 5 lb 14.7 oz (2.685 kg) M Vag-Spont EPI  LIV  1 SAB 2019            Results for orders placed or performed in visit on 02/01/20 (from the past 24 hour(s))  POCT urine pregnancy   Collection Time: 02/01/20 10:18 AM  Result Value Ref Range   Preg Test, Ur Positive (A) Negative    ASSESSMENT: Positive pregnancy test, [redacted]w[redacted]d by LMP    PLAN: Schedule for dating ultrasound in 1 week Prenatal vitamins: plans to begin OTC ASAP   Nausea medicines: not currently needed   OB packet given: Yes  [redacted]w[redacted]d  02/01/2020 10:20 AM

## 2020-02-10 ENCOUNTER — Other Ambulatory Visit: Payer: Medicaid Other

## 2020-02-17 ENCOUNTER — Other Ambulatory Visit: Payer: Self-pay | Admitting: Obstetrics and Gynecology

## 2020-02-17 DIAGNOSIS — O3680X0 Pregnancy with inconclusive fetal viability, not applicable or unspecified: Secondary | ICD-10-CM

## 2020-02-18 ENCOUNTER — Ambulatory Visit (INDEPENDENT_AMBULATORY_CARE_PROVIDER_SITE_OTHER): Payer: Medicaid Other

## 2020-02-18 DIAGNOSIS — O3680X0 Pregnancy with inconclusive fetal viability, not applicable or unspecified: Secondary | ICD-10-CM | POA: Diagnosis not present

## 2020-02-18 DIAGNOSIS — Z3A10 10 weeks gestation of pregnancy: Secondary | ICD-10-CM

## 2020-02-18 NOTE — Progress Notes (Signed)
[place] 10+1 wks,single IUP with YS,positive FHT 164 bpm,crl 32.01 mm,normal ovaries

## 2020-03-04 ENCOUNTER — Other Ambulatory Visit: Payer: Self-pay | Admitting: Obstetrics & Gynecology

## 2020-03-04 DIAGNOSIS — Z3682 Encounter for antenatal screening for nuchal translucency: Secondary | ICD-10-CM

## 2020-03-08 ENCOUNTER — Other Ambulatory Visit: Payer: Medicaid Other

## 2020-03-08 ENCOUNTER — Other Ambulatory Visit: Payer: Self-pay

## 2020-03-08 ENCOUNTER — Ambulatory Visit (INDEPENDENT_AMBULATORY_CARE_PROVIDER_SITE_OTHER): Payer: Medicaid Other

## 2020-03-08 DIAGNOSIS — Z3682 Encounter for antenatal screening for nuchal translucency: Secondary | ICD-10-CM | POA: Diagnosis not present

## 2020-03-08 DIAGNOSIS — Z3480 Encounter for supervision of other normal pregnancy, unspecified trimester: Secondary | ICD-10-CM

## 2020-03-08 DIAGNOSIS — Z3490 Encounter for supervision of normal pregnancy, unspecified, unspecified trimester: Secondary | ICD-10-CM | POA: Insufficient documentation

## 2020-03-08 NOTE — Addendum Note (Signed)
Addended by: Moss Mc on: 03/08/2020 12:30 PM   Modules accepted: Orders

## 2020-03-08 NOTE — Progress Notes (Signed)
[place] 12+6 wks,measurements c/w dates,crl 66.47 mm,fhr 150 bpm,posterior placenta,NB present,NT 1.3 mm

## 2020-03-09 LAB — CBC/D/PLT+RPR+RH+ABO+RUB AB...
Antibody Screen: NEGATIVE
Basophils Absolute: 0 10*3/uL (ref 0.0–0.2)
Basos: 0 %
EOS (ABSOLUTE): 0.1 10*3/uL (ref 0.0–0.4)
Eos: 1 %
HCV Ab: <0.1 s/co ratio (ref 0.0–0.9)
HIV Screen 4th Generation wRfx: NONREACTIVE
Hematocrit: 39.3 % (ref 34.0–46.6)
Hemoglobin: 13.1 g/dL (ref 11.1–15.9)
Hepatitis B Surface Ag: NEGATIVE
Immature Grans (Abs): 0 10*3/uL (ref 0.0–0.1)
Immature Granulocytes: 0 %
Lymphocytes Absolute: 1.8 10*3/uL (ref 0.7–3.1)
Lymphs: 24 %
MCH: 28.8 pg (ref 26.6–33.0)
MCHC: 33.3 g/dL (ref 31.5–35.7)
MCV: 86 fL (ref 79–97)
Monocytes Absolute: 0.5 10*3/uL (ref 0.1–0.9)
Monocytes: 7 %
Neutrophils Absolute: 4.9 10*3/uL (ref 1.4–7.0)
Neutrophils: 68 %
Platelets: 224 10*3/uL (ref 150–450)
RBC: 4.55 x10E6/uL (ref 3.77–5.28)
RDW: 13.3 % (ref 11.7–15.4)
RPR Ser Ql: NONREACTIVE
Rh Factor: NEGATIVE
Rubella Antibodies, IGG: 9.04 index (ref >0.99)
WBC: 7.3 10*3/uL (ref 3.4–10.8)

## 2020-03-09 LAB — HCV INTERPRETATION

## 2020-03-10 LAB — INTEGRATED 1
Crown Rump Length: 66.5 mm
Gest. Age on Collection Date: 12.7 weeks
Maternal Age at EDD: 25.2 yr
Nuchal Translucency (NT): 1.3 mm
Number of Fetuses: 1
PAPP-A Value: 2510.1 ng/mL
Weight: 121 [lb_av]

## 2020-03-14 ENCOUNTER — Encounter: Payer: Self-pay | Admitting: Women's Health

## 2020-03-14 ENCOUNTER — Ambulatory Visit (INDEPENDENT_AMBULATORY_CARE_PROVIDER_SITE_OTHER): Payer: Medicaid Other | Admitting: Women's Health

## 2020-03-14 ENCOUNTER — Ambulatory Visit: Payer: Medicaid Other | Admitting: *Deleted

## 2020-03-14 VITALS — BP 117/81 | HR 76 | Wt 121.0 lb

## 2020-03-14 DIAGNOSIS — Z3481 Encounter for supervision of other normal pregnancy, first trimester: Secondary | ICD-10-CM

## 2020-03-14 DIAGNOSIS — O26891 Other specified pregnancy related conditions, first trimester: Secondary | ICD-10-CM

## 2020-03-14 DIAGNOSIS — Z3480 Encounter for supervision of other normal pregnancy, unspecified trimester: Secondary | ICD-10-CM

## 2020-03-14 DIAGNOSIS — Z331 Pregnant state, incidental: Secondary | ICD-10-CM

## 2020-03-14 DIAGNOSIS — Z1389 Encounter for screening for other disorder: Secondary | ICD-10-CM

## 2020-03-14 DIAGNOSIS — Z3A13 13 weeks gestation of pregnancy: Secondary | ICD-10-CM

## 2020-03-14 DIAGNOSIS — Z6791 Unspecified blood type, Rh negative: Secondary | ICD-10-CM | POA: Insufficient documentation

## 2020-03-14 DIAGNOSIS — O26899 Other specified pregnancy related conditions, unspecified trimester: Secondary | ICD-10-CM | POA: Insufficient documentation

## 2020-03-14 DIAGNOSIS — Z363 Encounter for antenatal screening for malformations: Secondary | ICD-10-CM

## 2020-03-14 LAB — POCT URINALYSIS DIPSTICK OB
Blood, UA: NEGATIVE
Glucose, UA: NEGATIVE
Ketones, UA: NEGATIVE
Leukocytes, UA: NEGATIVE
Nitrite, UA: NEGATIVE
POC,PROTEIN,UA: NEGATIVE

## 2020-03-14 NOTE — Patient Instructions (Signed)
Chelsea Burgess, I greatly value your feedback.  If you receive a survey following your visit with [place] today, we appreciate you taking the time to fill it out.  Thanks, [name], CNM, WHNP-BC   [facility] ([address] [place], [place] [address]) Entrance C, located off of [address] Free 24/7 valet parking   Nausea & Vomiting  Have saltine crackers or pretzels by your bed and eat a few bites before you raise your head out of bed in the morning  Eat small frequent meals throughout the day instead of large meals  Drink plenty of fluids throughout the day to stay hydrated, just don't drink a lot of fluids with your meals.  This can make your stomach fill up faster making you feel sick  Do not brush your teeth right after you eat  Products with real ginger are good for nausea, like ginger ale and ginger hard candy Make sure it says made with real ginger!  Sucking on sour candy like lemon heads is also good for nausea  If your prenatal vitamins make you nauseated, take them at night so you will sleep through the nausea  Sea Bands  If you feel like you need medicine for the nausea & vomiting please let us know  If you are unable to keep any fluids or food down please let us know   Constipation  Drink plenty of fluid, preferably water, throughout the day  Eat foods high in fiber such as fruits, vegetables, and grains  Exercise, such as walking, is a good way to keep your bowels regular  Drink warm fluids, especially warm prune juice, or decaf coffee  Eat a 1/2 cup of real oatmeal (not instant), 1/2 cup applesauce, and 1/2-1 cup warm prune juice every day  If needed, you may take Colace (docusate sodium) stool softener once or twice a day to help keep the stool soft.   If you still are having problems with constipation, you may take Miralax once daily as needed to help keep your bowels regular.   Home Blood Pressure Monitoring for Patients     Your provider has recommended that you check your blood pressure (BP) at least once a week at home. If you do not have a blood pressure cuff at home, one will be provided for you. Contact your provider if you have not received your monitor within 1 week.   Helpful Tips for Accurate Home Blood Pressure Checks  . Don't smoke, exercise, or drink caffeine 30 minutes before checking your BP . Use the restroom before checking your BP (a full bladder can raise your pressure) . Relax in a comfortable upright chair . Feet on the ground . Left arm resting comfortably on a flat surface at the level of your heart . Legs uncrossed . Back supported . Sit quietly and don't talk . Place the cuff on your bare arm . Adjust snuggly, so that only two fingertips can fit between your skin and the top of the cuff . Check 2 readings separated by at least one minute . Keep a log of your BP readings . For a visual, please reference this diagram: http://ccnc.care/bpdiagram  Provider Name: [facility]     Phone: [phone]  Zone 1: ALL CLEAR  Continue to monitor your symptoms:  . BP reading is less than 140 (top number) or less than 90 (bottom number)  . No right upper stomach pain . No headaches  or seeing spots . No feeling nauseated or throwing up . No swelling in face and hands  Zone 2: CAUTION Call your doctor's office for any of the following:  . BP reading is greater than 140 (top number) or greater than 90 (bottom number)  . Stomach pain under your ribs in the middle or right side . Headaches or seeing spots . Feeling nauseated or throwing up . Swelling in face and hands  Zone 3: EMERGENCY  Seek immediate medical care if you have any of the following:  . BP reading is greater than160 (top number) or greater than 110 (bottom number) . Severe headaches not improving with Tylenol . Serious difficulty catching your breath . Any worsening symptoms from Zone 2    First Trimester of  Pregnancy The first trimester of pregnancy is from week 1 until the end of week 12 (months 1 through 3). A week after a sperm fertilizes an egg, the egg will implant on the wall of the uterus. This embryo will begin to develop into a baby. Genes from you and your partner are forming the baby. The female genes determine whether the baby is a boy or a girl. At 6-8 weeks, the eyes and face are formed, and the heartbeat can be seen on ultrasound. At the end of 12 weeks, all the baby's organs are formed.  Now that you are pregnant, you will want to do everything you can to have a healthy baby. Two of the most important things are to get good prenatal care and to follow your health care provider's instructions. Prenatal care is all the medical care you receive before the baby's birth. This care will help prevent, find, and treat any problems during the pregnancy and childbirth. BODY CHANGES Your body goes through many changes during pregnancy. The changes vary from woman to woman.   You may gain or lose a couple of pounds at first.  You may feel sick to your stomach (nauseous) and throw up (vomit). If the vomiting is uncontrollable, call your health care provider.  You may tire easily.  You may develop headaches that can be relieved by medicines approved by your health care provider.  You may urinate more often. Painful urination may mean you have a bladder infection.  You may develop heartburn as a result of your pregnancy.  You may develop constipation because certain hormones are causing the muscles that push waste through your intestines to slow down.  You may develop hemorrhoids or swollen, bulging veins (varicose veins).  Your breasts may begin to grow larger and become tender. Your nipples may stick out more, and the tissue that surrounds them (areola) may become darker.  Your gums may bleed and may be sensitive to brushing and flossing.  Dark spots or blotches (chloasma, mask of pregnancy)  may develop on your face. This will likely fade after the baby is born.  Your menstrual periods will stop.  You may have a loss of appetite.  You may develop cravings for certain kinds of food.  You may have changes in your emotions from day to day, such as being excited to be pregnant or being concerned that something may go wrong with the pregnancy and baby.  You may have more vivid and strange dreams.  You may have changes in your hair. These can include thickening of your hair, rapid growth, and changes in texture. Some women also have hair loss during or after pregnancy, or hair that feels dry or thin.  Your hair will most likely return to normal after your baby is born. WHAT TO EXPECT AT YOUR PRENATAL VISITS During a routine prenatal visit:  You will be weighed to make sure you and the baby are growing normally.  Your blood pressure will be taken.  Your abdomen will be measured to track your baby's growth.  The fetal heartbeat will be listened to starting around week 10 or 12 of your pregnancy.  Test results from any previous visits will be discussed. Your health care provider may ask you:  How you are feeling.  If you are feeling the baby move.  If you have had any abnormal symptoms, such as leaking fluid, bleeding, severe headaches, or abdominal cramping.  If you have any questions. Other tests that may be performed during your first trimester include:  Blood tests to find your blood type and to check for the presence of any previous infections. They will also be used to check for low iron levels (anemia) and Rh antibodies. Later in the pregnancy, blood tests for diabetes will be done along with other tests if problems develop.  Urine tests to check for infections, diabetes, or protein in the urine.  An ultrasound to confirm the proper growth and development of the baby.  An amniocentesis to check for possible genetic problems.  Fetal screens for spina bifida and  Down syndrome.  You may need other tests to make sure you and the baby are doing well. HOME CARE INSTRUCTIONS  Medicines  Follow your health care provider's instructions regarding medicine use. Specific medicines may be either safe or unsafe to take during pregnancy.  Take your prenatal vitamins as directed.  If you develop constipation, try taking a stool softener if your health care provider approves. Diet  Eat regular, well-balanced meals. Choose a variety of foods, such as meat or vegetable-based protein, fish, milk and low-fat dairy products, vegetables, fruits, and whole grain breads and cereals. Your health care provider will help you determine the amount of weight gain that is right for you.  Avoid raw meat and uncooked cheese. These carry germs that can cause birth defects in the baby.  Eating four or five small meals rather than three large meals a day may help relieve nausea and vomiting. If you start to feel nauseous, eating a few soda crackers can be helpful. Drinking liquids between meals instead of during meals also seems to help nausea and vomiting.  If you develop constipation, eat more high-fiber foods, such as fresh vegetables or fruit and whole grains. Drink enough fluids to keep your urine clear or pale yellow. Activity and Exercise  Exercise only as directed by your health care provider. Exercising will help you:  Control your weight.  Stay in shape.  Be prepared for labor and delivery.  Experiencing pain or cramping in the lower abdomen or low back is a good sign that you should stop exercising. Check with your health care provider before continuing normal exercises.  Try to avoid standing for long periods of time. Move your legs often if you must stand in one place for a long time.  Avoid heavy lifting.  Wear low-heeled shoes, and practice good posture.  You may continue to have sex unless your health care provider directs you otherwise. Relief of Pain  or Discomfort  Wear a good support bra for breast tenderness.    Take warm sitz baths to soothe any pain or discomfort caused by hemorrhoids. Use hemorrhoid cream if your health care  provider approves.    Rest with your legs elevated if you have leg cramps or low back pain.  If you develop varicose veins in your legs, wear support hose. Elevate your feet for 15 minutes, 3-4 times a day. Limit salt in your diet. Prenatal Care  Schedule your prenatal visits by the twelfth week of pregnancy. They are usually scheduled monthly at first, then more often in the last 2 months before delivery.  Write down your questions. Take them to your prenatal visits.  Keep all your prenatal visits as directed by your health care provider. Safety  Wear your seat belt at all times when driving.  Make a list of emergency phone numbers, including numbers for family, friends, the hospital, and police and fire departments. General Tips  Ask your health care provider for a referral to a local prenatal education class. Begin classes no later than at the beginning of month 6 of your pregnancy.  Ask for help if you have counseling or nutritional needs during pregnancy. Your health care provider can offer advice or refer you to specialists for help with various needs.  Do not use hot tubs, steam rooms, or saunas.  Do not douche or use tampons or scented sanitary pads.  Do not cross your legs for long periods of time.  Avoid cat litter boxes and soil used by cats. These carry germs that can cause birth defects in the baby and possibly loss of the fetus by miscarriage or stillbirth.  Avoid all smoking, herbs, alcohol, and medicines not prescribed by your health care provider. Chemicals in these affect the formation and growth of the baby.  Schedule a dentist appointment. At home, brush your teeth with a soft toothbrush and be gentle when you floss. SEEK MEDICAL CARE IF:   You have dizziness.  You have mild  pelvic cramps, pelvic pressure, or nagging pain in the abdominal area.  You have persistent nausea, vomiting, or diarrhea.  You have a bad smelling vaginal discharge.  You have pain with urination.  You notice increased swelling in your face, hands, legs, or ankles. SEEK IMMEDIATE MEDICAL CARE IF:   You have a fever.  You are leaking fluid from your vagina.  You have spotting or bleeding from your vagina.  You have severe abdominal cramping or pain.  You have rapid weight gain or loss.  You vomit blood or material that looks like coffee grounds.  You are exposed to [place] measles and have never had them.  You are exposed to fifth disease or chickenpox.  You develop a severe headache.  You have shortness of breath.  You have any kind of trauma, such as from a fall or a car accident. Document Released: 06/05/2001 Document Revised: 10/26/2013 Document Reviewed: 04/21/2013 [facility] Patient Information 2015 [place], [place]. This information is not intended to replace advice given to you by your health care provider. Make sure you discuss any questions you have with your health care provider.

## 2020-03-14 NOTE — Progress Notes (Signed)
   NURSE VISIT- [facility]  SUBJECTIVE:  Chelsea Burgess is a 24 y.o. G20P1011 female here for [facility] NIPT and [facility] Carrier Screening . She is [redacted]w[redacted]d pregnant.   OBJECTIVE:  Appears well, in no apparent distress  Blood work drawn from left [facility] without difficulty. 1 attempt(s).   ASSESSMENT: Pregnancy [redacted]w[redacted]d Panorama NIPT and Horizon Carrier Screening  PLAN: [facility] portal information given and instructed patient how to access results   Jobe Marker  03/14/2020 2:42 PM

## 2020-03-14 NOTE — Progress Notes (Signed)
INITIAL OBSTETRICAL VISIT Patient name: Chelsea Burgess MRN [id number]  Date of birth: 1995-09-11 Chief Complaint:   Initial Prenatal Visit  History of Present Illness:   Chelsea Burgess is a 24 y.o. G49P1011 African American female at [redacted]w[redacted]d by LMP c/w u/s at 10 weeks with an Estimated Date of Delivery: 09/14/20 being seen today for her initial obstetrical visit.   Her obstetrical history is significant for SAB x 1, term uncomplicated SVB x 1.   Today she reports no complaints.  Recent +CT, will do poc today Depression screen [facility] 2/9 03/14/2020 06/19/2018  Decreased Interest 0 0  Down, Depressed, Hopeless 0 0  PHQ - 2 Score 0 0  Altered sleeping 0 0  Tired, decreased energy 1 1  Change in appetite 1 0  Feeling bad or failure about yourself  0 0  Trouble concentrating 0 0  Moving slowly or fidgety/restless 0 0  Suicidal thoughts 0 0  PHQ-9 Score 2 1    Patient's last menstrual period was 12/09/2019 (exact date). Last pap 06/19/18. Results were: ASCUS w/ +HRHPV, colpo normal, needs repeat pap-wants to do next visit Review of Systems:   Pertinent items are noted in HPI Denies cramping/contractions, leakage of fluid, vaginal bleeding, abnormal vaginal discharge w/ itching/odor/irritation, headaches, visual changes, shortness of breath, chest pain, abdominal pain, severe nausea/vomiting, or problems with urination or bowel movements unless otherwise stated above.  Pertinent History Reviewed:  Reviewed past medical,surgical, social, obstetrical and family history.  Reviewed problem list, medications and allergies. OB History  Gravida Para Term Preterm AB Living  3 1 1   1 1   SAB TAB Ectopic Multiple Live Births  1     0 1    # Outcome Date GA Lbr Len/2nd Weight Sex Delivery Anes PTL Lv  3 Current           2 Term 11/29/18 [redacted]w[redacted]d 05:46 / 02:07 5 lb 14.7 oz (2.685 kg) M Vag-Spont EPI N LIV  1 SAB 2019           Physical Assessment:   Vitals:   03/14/20 1427  BP: 117/81   Pulse: 76  Weight: 121 lb (54.9 kg)  Body mass index is 18.95 kg/m.       Physical Examination:  General appearance - well appearing, and in no distress  Mental status - alert, oriented to person, place, and time  Psych:  She has a normal mood and affect  Skin - warm and dry, normal color, no suspicious lesions noted  Chest - effort normal, all lung fields clear to auscultation bilaterally  Heart - normal rate and regular rhythm  Abdomen - soft, nontender  Extremities:  No swelling or varicosities noted  Thin prep pap is not done  TODAY'S NT 157 via doppler  Results for orders placed or performed in visit on 03/14/20 (from the past 24 hour(s))  POC Urinalysis Dipstick OB   Collection Time: 03/14/20  2:46 PM  Result Value Ref Range   Color, UA     Clarity, UA     Glucose, UA Negative Negative   Bilirubin, UA     Ketones, UA neg    Spec Grav, UA     Blood, UA neg    pH, UA     POC,PROTEIN,UA Negative Negative, Trace, Small (1+), Moderate (2+), Large (3+), 4+   Urobilinogen, UA     Nitrite, UA neg    Leukocytes, UA Negative Negative   Appearance  Odor      Assessment & Plan:  1) Low-Risk Pregnancy G3P1011 at [redacted]w[redacted]d with an Estimated Date of Delivery: 09/14/20   2) Initial OB visit  3) H/O abnormal pap> needs repeat, wants next visit  4) Recent +CT> POC today  Meds: No orders of the defined types were placed in this encounter.   Initial labs obtained Continue prenatal vitamins Reviewed n/v relief measures and warning s/s to report Reviewed recommended weight gain based on pre-gravid BMI Encouraged well-balanced diet Genetic & carrier screening discussed: requests Panorama, NT/IT and Horizon 14  Ultrasound discussed; fetal survey: requested CCNC completed> form faxed if has or is planning to apply for medicaid The nature of [facility] [employer] with multiple MDs and other Advanced Practice Providers was explained to patient; also emphasized  that fellows, residents, and students are part of our team. Has home bp cuff. Check bp weekly, let us know if >140/90.   Follow-up: Return in about 1 month (around 04/13/2020) for LROB w/ pap, 2nd IT, [id number], in person, CNM.   Orders Placed This Encounter  Procedures  . Urine Culture  . GC/Chlamydia Probe Amp  . US OB Comp + 14 Wk  . Genetic Screening  . Pain Management Screening Profile (10S)  . POC Urinalysis Dipstick OB    Cheral Marker CNM, [facility] 03/14/2020 3:01 PM

## 2020-03-16 ENCOUNTER — Encounter: Payer: Self-pay | Admitting: Women's Health

## 2020-03-16 DIAGNOSIS — F1290 Cannabis use, unspecified, uncomplicated: Secondary | ICD-10-CM | POA: Insufficient documentation

## 2020-03-16 LAB — PMP SCREEN PROFILE (10S), URINE
Amphetamine Scrn, Ur: NEGATIVE ng/mL
BARBITURATE SCREEN URINE: NEGATIVE ng/mL
BENZODIAZEPINE SCREEN, URINE: NEGATIVE ng/mL
CANNABINOIDS UR QL SCN: POSITIVE ng/mL — AB
Cocaine (Metab) Scrn, Ur: NEGATIVE ng/mL
Creatinine(Crt), U: 242.2 mg/dL (ref 20.0–300.0)
Methadone Screen, Urine: NEGATIVE ng/mL
OXYCODONE+OXYMORPHONE UR QL SCN: NEGATIVE ng/mL
Opiate Scrn, Ur: NEGATIVE ng/mL
Ph of Urine: 6 (ref 4.5–8.9)
Phencyclidine Qn, Ur: NEGATIVE ng/mL
Propoxyphene Scrn, Ur: NEGATIVE ng/mL

## 2020-03-17 LAB — GC/CHLAMYDIA PROBE AMP
Chlamydia trachomatis, NAA: POSITIVE — AB
Neisseria Gonorrhoeae by PCR: NEGATIVE

## 2020-03-17 LAB — URINE CULTURE

## 2020-03-21 ENCOUNTER — Other Ambulatory Visit: Payer: Self-pay | Admitting: Women's Health

## 2020-03-21 ENCOUNTER — Encounter: Payer: Self-pay | Admitting: Women's Health

## 2020-03-21 DIAGNOSIS — O2340 Unspecified infection of urinary tract in pregnancy, unspecified trimester: Secondary | ICD-10-CM | POA: Insufficient documentation

## 2020-03-21 DIAGNOSIS — A749 Chlamydial infection, unspecified: Secondary | ICD-10-CM | POA: Insufficient documentation

## 2020-03-21 MED ORDER — AZITHROMYCIN 500 MG PO TABS: 1000.0000 mg | ORAL_TABLET | Freq: Once | ORAL | 0 refills | Status: AC

## 2020-03-21 MED ORDER — NITROFURANTOIN MONOHYD MACRO 100 MG PO CAPS: 100.0000 mg | ORAL_CAPSULE | Freq: Two times a day (BID) | ORAL | 0 refills | Status: DC

## 2020-03-30 ENCOUNTER — Other Ambulatory Visit: Payer: Self-pay | Admitting: Advanced Practice Midwife

## 2020-03-30 DIAGNOSIS — R8271 Bacteriuria: Secondary | ICD-10-CM

## 2020-03-30 MED ORDER — CIPRO 500 MG/5ML (10%) PO SUSR: 500.0000 mg | Freq: Two times a day (BID) | ORAL | 0 refills | Status: AC

## 2020-04-13 ENCOUNTER — Encounter: Payer: Self-pay | Admitting: Advanced Practice Midwife

## 2020-04-13 ENCOUNTER — Other Ambulatory Visit (HOSPITAL_COMMUNITY)
Admission: RE | Admit: 2020-04-13 | Discharge: 2020-04-13 | Disposition: A | Discharge status: Home / Self Care | Payer: Medicaid Other | Source: Ambulatory Visit | Attending: Advanced Practice Midwife | Admitting: Advanced Practice Midwife

## 2020-04-13 ENCOUNTER — Ambulatory Visit (INDEPENDENT_AMBULATORY_CARE_PROVIDER_SITE_OTHER): Payer: Medicaid Other | Admitting: Advanced Practice Midwife

## 2020-04-13 ENCOUNTER — Ambulatory Visit (INDEPENDENT_AMBULATORY_CARE_PROVIDER_SITE_OTHER): Payer: Medicaid Other

## 2020-04-13 ENCOUNTER — Other Ambulatory Visit: Payer: Self-pay

## 2020-04-13 VITALS — BP 112/78 | HR 77 | Wt 122.0 lb

## 2020-04-13 DIAGNOSIS — Z3480 Encounter for supervision of other normal pregnancy, unspecified trimester: Secondary | ICD-10-CM | POA: Diagnosis not present

## 2020-04-13 DIAGNOSIS — R8271 Bacteriuria: Secondary | ICD-10-CM

## 2020-04-13 DIAGNOSIS — Z3481 Encounter for supervision of other normal pregnancy, first trimester: Secondary | ICD-10-CM | POA: Diagnosis not present

## 2020-04-13 DIAGNOSIS — O26892 Other specified pregnancy related conditions, second trimester: Secondary | ICD-10-CM

## 2020-04-13 DIAGNOSIS — A749 Chlamydial infection, unspecified: Secondary | ICD-10-CM

## 2020-04-13 DIAGNOSIS — Z363 Encounter for antenatal screening for malformations: Secondary | ICD-10-CM

## 2020-04-13 DIAGNOSIS — R87619 Unspecified abnormal cytological findings in specimens from cervix uteri: Secondary | ICD-10-CM

## 2020-04-13 DIAGNOSIS — O99891 Other specified diseases and conditions complicating pregnancy: Secondary | ICD-10-CM

## 2020-04-13 DIAGNOSIS — Z3A18 18 weeks gestation of pregnancy: Secondary | ICD-10-CM | POA: Diagnosis present

## 2020-04-13 DIAGNOSIS — Z331 Pregnant state, incidental: Secondary | ICD-10-CM

## 2020-04-13 DIAGNOSIS — Z1389 Encounter for screening for other disorder: Secondary | ICD-10-CM

## 2020-04-13 LAB — POCT URINALYSIS DIPSTICK OB
Blood, UA: NEGATIVE
Glucose, UA: NEGATIVE
Ketones, UA: NEGATIVE
Leukocytes, UA: NEGATIVE
Nitrite, UA: NEGATIVE
POC,PROTEIN,UA: NEGATIVE

## 2020-04-13 MED ORDER — AZITHROMYCIN 250 MG PO TABS: 250.0000 mg | ORAL_TABLET | Freq: Once | ORAL | 0 refills | Status: AC

## 2020-04-13 MED ORDER — AMOXICILLIN 250 MG PO CHEW
500.0000 mg | CHEWABLE_TABLET | Freq: Two times a day (BID) | ORAL | 0 refills | Status: DC
Start: 2020-04-13 — End: 2020-05-25

## 2020-04-13 NOTE — Progress Notes (Signed)
[place] 18 wks,cephalic,cx 4.5 cm,left lateral placenta gr 0,normal ovaries,svp of fluid 5.2 cm,fhr 150 bpm,LVEICF 1.8 mm,EFW 216 g 40%,anatomy complete

## 2020-04-13 NOTE — Patient Instructions (Signed)
Chelsea Burgess, I greatly value your feedback.  If you receive a survey following your visit with [place] today, we appreciate you taking the time to fill it out.  Thanks, Philipp Deputy, CNM  [facility] ([address] [place], [place] [address]) Entrance C, located off of [address] valet parking  Go to [email or web address] to register for FREE online childbirth classes  [place] Pediatricians/Family Doctors:  [facility] [phone]            [facility] [phone]                 [facility] [phone] (usually not accepting new patients unless you have family there already, you are always welcome to call and ask)       [facility] Department [phone]       [facility] Pediatricians/Family Doctors:   [facility]: [phone]  [facility]: [phone]  [facility]: [phone]  [facility] Doctors:   [facility]: [phone]   [facility]: [phone]  [facility] Doctors:  [facility]: [phone]    Home Blood Pressure Monitoring for Patients   Your provider has recommended that you check your blood pressure (BP) at least once a week at home. If you do not have a blood pressure cuff at home, one will be provided for you. Contact your provider if you have not received your monitor within 1 week.   Helpful Tips for Accurate Home Blood Pressure Checks  . Don't smoke, exercise, or drink caffeine 30 minutes before checking your BP . Use the restroom before checking your BP (a full bladder can raise your pressure) . Relax in a comfortable upright chair . Feet on the ground . Left arm resting comfortably on a flat surface at the level of your heart . Legs uncrossed . Back supported . Sit quietly and don't talk . Place the cuff on your bare arm . Adjust snuggly, so that only  two fingertips can fit between your skin and the top of the cuff . Check 2 readings separated by at least one minute . Keep a log of your BP readings . For a visual, please reference this diagram: http://ccnc.care/bpdiagram  Provider Name: [facility]     Phone: [phone]  Zone 1: ALL CLEAR  Continue to monitor your symptoms:  . BP reading is less than 140 (top number) or less than 90 (bottom number)  . No right upper stomach pain . No headaches or seeing spots . No feeling nauseated or throwing up . No swelling in face and hands  Zone 2: CAUTION Call your doctor's office for any of the following:  . BP reading is greater than 140 (top number) or greater than 90 (bottom number)  . Stomach pain under your ribs in the middle or right side . Headaches or seeing spots . Feeling nauseated or throwing up . Swelling in face and hands  Zone 3: EMERGENCY  Seek immediate medical care if you have any of the following:  . BP reading is greater than160 (top number) or greater than 110 (bottom number) . Severe headaches not improving with Tylenol . Serious difficulty catching your breath . Any worsening symptoms from Zone 2     Second Trimester of Pregnancy The second trimester is from week 14 through week 27 (months 4 through 6). The second trimester is often a time when you feel your best.  Your body has adjusted to being pregnant, and you begin to feel better physically. Usually, morning sickness has lessened or quit completely, you may have more energy, and you may have an increase in appetite. The second trimester is also a time when the fetus is growing rapidly. At the end of the sixth month, the fetus is about 9 inches long and weighs about 1 pounds. You will likely begin to feel the baby move (quickening) between 16 and 20 weeks of pregnancy. Body changes during your second trimester Your body continues to go through many changes during your second trimester. The changes vary  from woman to woman.  Your weight will continue to increase. You will notice your lower abdomen bulging out.  You may begin to get stretch marks on your hips, abdomen, and breasts.  You may develop headaches that can be relieved by medicines. The medicines should be approved by your health care provider.  You may urinate more often because the fetus is pressing on your bladder.  You may develop or continue to have heartburn as a result of your pregnancy.  You may develop constipation because certain hormones are causing the muscles that push waste through your intestines to slow down.  You may develop hemorrhoids or swollen, bulging veins (varicose veins).  You may have back pain. This is caused by: ? Weight gain. ? Pregnancy hormones that are relaxing the joints in your pelvis. ? A shift in weight and the muscles that support your balance.  Your breasts will continue to grow and they will continue to become tender.  Your gums may bleed and may be sensitive to brushing and flossing.  Dark spots or blotches (chloasma, mask of pregnancy) may develop on your face. This will likely fade after the baby is born.  A dark line from your belly button to the pubic area (linea nigra) may appear. This will likely fade after the baby is born.  You may have changes in your hair. These can include thickening of your hair, rapid growth, and changes in texture. Some women also have hair loss during or after pregnancy, or hair that feels dry or thin. Your hair will most likely return to normal after your baby is born.  What to expect at prenatal visits During a routine prenatal visit:  You will be weighed to make sure you and the fetus are growing normally.  Your blood pressure will be taken.  Your abdomen will be measured to track your baby's growth.  The fetal heartbeat will be listened to.  Any test results from the previous visit will be discussed.  Your health care provider may ask  you:  How you are feeling.  If you are feeling the baby move.  If you have had any abnormal symptoms, such as leaking fluid, bleeding, severe headaches, or abdominal cramping.  If you are using any tobacco products, including cigarettes, chewing tobacco, and electronic cigarettes.  If you have any questions.  Other tests that may be performed during your second trimester include:  Blood tests that check for: ? Low iron levels (anemia). ? High blood sugar that affects pregnant women (gestational diabetes) between 14 and 28 weeks. ? Rh antibodies. This is to check for a protein on red blood cells (Rh factor).  Urine tests to check for infections, diabetes, or protein in the urine.  An ultrasound to confirm the proper growth and development of the baby.  An amniocentesis to check for possible genetic problems.  Fetal screens  for spina bifida and Down syndrome.  HIV (human immunodeficiency virus) testing. Routine prenatal testing includes screening for HIV, unless you choose not to have this test.  Follow these instructions at home: Medicines  Follow your health care provider's instructions regarding medicine use. Specific medicines may be either safe or unsafe to take during pregnancy.  Take a prenatal vitamin that contains at least 600 micrograms (mcg) of folic acid.  If you develop constipation, try taking a stool softener if your health care provider approves. Eating and drinking  Eat a balanced diet that includes fresh fruits and vegetables, whole grains, good sources of protein such as meat, eggs, or tofu, and low-fat dairy. Your health care provider will help you determine the amount of weight gain that is right for you.  Avoid raw meat and uncooked cheese. These carry germs that can cause birth defects in the baby.  If you have low calcium intake from food, talk to your health care provider about whether you should take a daily calcium supplement.  Limit foods that  are high in fat and processed sugars, such as fried and sweet foods.  To prevent constipation: ? Drink enough fluid to keep your urine clear or pale yellow. ? Eat foods that are high in fiber, such as fresh fruits and vegetables, whole grains, and beans. Activity  Exercise only as directed by your health care provider. Most women can continue their usual exercise routine during pregnancy. Try to exercise for 30 minutes at least 5 days a week. Stop exercising if you experience uterine contractions.  Avoid heavy lifting, wear low heel shoes, and practice good posture.  A sexual relationship may be continued unless your health care provider directs you otherwise. Relieving pain and discomfort  Wear a good support bra to prevent discomfort from breast tenderness.  Take warm sitz baths to soothe any pain or discomfort caused by hemorrhoids. Use hemorrhoid cream if your health care provider approves.  Rest with your legs elevated if you have leg cramps or low back pain.  If you develop varicose veins, wear support hose. Elevate your feet for 15 minutes, 3-4 times a day. Limit salt in your diet. Prenatal Care  Write down your questions. Take them to your prenatal visits.  Keep all your prenatal visits as told by your health care provider. This is important. Safety  Wear your seat belt at all times when driving.  Make a list of emergency phone numbers, including numbers for family, friends, the hospital, and police and fire departments. General instructions  Ask your health care provider for a referral to a local prenatal education class. Begin classes no later than the beginning of month 6 of your pregnancy.  Ask for help if you have counseling or nutritional needs during pregnancy. Your health care provider can offer advice or refer you to specialists for help with various needs.  Do not use hot tubs, steam rooms, or saunas.  Do not douche or use tampons or scented sanitary  pads.  Do not cross your legs for long periods of time.  Avoid cat litter boxes and soil used by cats. These carry germs that can cause birth defects in the baby and possibly loss of the fetus by miscarriage or stillbirth.  Avoid all smoking, herbs, alcohol, and unprescribed drugs. Chemicals in these products can affect the formation and growth of the baby.  Do not use any products that contain nicotine or tobacco, such as cigarettes and e-cigarettes. If you need help quitting,  ask your health care provider.  Visit your dentist if you have not gone yet during your pregnancy. Use a soft toothbrush to brush your teeth and be gentle when you floss. Contact a health care provider if:  You have dizziness.  You have mild pelvic cramps, pelvic pressure, or nagging pain in the abdominal area.  You have persistent nausea, vomiting, or diarrhea.  You have a bad smelling vaginal discharge.  You have pain when you urinate. Get help right away if:  You have a fever.  You are leaking fluid from your vagina.  You have spotting or bleeding from your vagina.  You have severe abdominal cramping or pain.  You have rapid weight gain or weight loss.  You have shortness of breath with chest pain.  You notice sudden or extreme swelling of your face, hands, ankles, feet, or legs.  You have not felt your baby move in over an hour.  You have severe headaches that do not go away when you take medicine.  You have vision changes. Summary  The second trimester is from week 14 through week 27 (months 4 through 6). It is also a time when the fetus is growing rapidly.  Your body goes through many changes during pregnancy. The changes vary from woman to woman.  Avoid all smoking, herbs, alcohol, and unprescribed drugs. These chemicals affect the formation and growth your baby.  Do not use any tobacco products, such as cigarettes, chewing tobacco, and e-cigarettes. If you need help quitting, ask your  health care provider.  Contact your health care provider if you have any questions. Keep all prenatal visits as told by your health care provider. This is important. This information is not intended to replace advice given to you by your health care provider. Make sure you discuss any questions you have with your health care provider. Document Released: 06/05/2001 Document Revised: 11/17/2015 Document Reviewed: 08/12/2012 Elsevier Interactive Patient Education  2017 [employer].

## 2020-04-13 NOTE — Progress Notes (Signed)
LOW-RISK PREGNANCY VISIT Patient name: Chelsea Burgess MRN [id number]  Date of birth: 03/27/1996 Chief Complaint:   Routine Prenatal Visit (u/s)  History of Present Illness:   Chelsea Burgess is a 24 y.o. G60P1011 female at [redacted]w[redacted]d with an Estimated Date of Delivery: 09/14/20 being seen today for ongoing management of a low-risk pregnancy.  Today she reports doing well; has extreme intolerance to taking pills and was unable to keep the azithromycin down for her chlamydia tx, and liquid Cipro was ordered for her UTI but the pharmacy was unable to obtain this> both remain untreated. Contractions: Not present. Vag. Bleeding: None.  Movement: Present. denies leaking of fluid. Review of Systems:   Pertinent items are noted in HPI Denies abnormal vaginal discharge w/ itching/odor/irritation, headaches, visual changes, shortness of breath, chest pain, abdominal pain, severe nausea/vomiting, or problems with urination or bowel movements unless otherwise stated above. Pertinent History Reviewed:  Reviewed past medical,surgical, social, obstetrical and family history.  Reviewed problem list, medications and allergies. Physical Assessment:   Vitals:   04/13/20 1124  BP: 112/78  Pulse: 77  Weight: 122 lb (55.3 kg)  Body mass index is 19.11 kg/m.        Physical Examination:   General appearance: Well appearing, and in no distress  Mental status: Alert, oriented to person, place, and time  Skin: Warm & dry  Cardiovascular: Normal heart rate noted  Respiratory: Normal respiratory effort, no distress  Abdomen: Soft, gravid, nontender  Pelvic: Cervical exam deferred (Thin prep Pap collected)        Extremities:  neg, no edema  Fetal Status: Fetal Heart Rate (bpm): 150 u/s   Movement: Present     Anatomy u/s: [place] 18 wks,cephalic,cx 4.5 cm,left lateral placenta gr 0,normal ovaries,svp of fluid 5.2 cm,fhr 150 bpm,LVEICF 1.8 mm,EFW 216 g 40%,anatomy complete   Results for orders placed or  performed in visit on 04/13/20 (from the past 24 hour(s))  POC Urinalysis Dipstick OB   Collection Time: 04/13/20 11:29 AM  Result Value Ref Range   Color, UA     Clarity, UA     Glucose, UA Negative Negative   Bilirubin, UA     Ketones, UA neg    Spec Grav, UA     Blood, UA neg    pH, UA     POC,PROTEIN,UA Negative Negative, Trace, Small (1+), Moderate (2+), Large (3+), 4+   Urobilinogen, UA     Nitrite, UA neg    Leukocytes, UA Negative Negative   Appearance     Odor      Assessment & Plan:  1) Low-risk pregnancy G3P1011 at [redacted]w[redacted]d with an Estimated Date of Delivery: 09/14/20   2) Previous abnl Pap, repeat today  3) +Chlamydia @ [facility]> tx on 03/21/20 but vomited, tx today with 250mg  x 4 tabs (she states she feels like she can hold them under her tongue); instructed to notify if unable to keep them down; POC at next visit  4) +e Coli UTI 9/20> liquid Cipro was sent in but was unable to be filled by pharmacy; chewable Amox sent in today and pt to notify if she can't get these meds or isn't able to tolerate; POC at next visit   Meds:  Meds ordered this encounter  Medications  . azithromycin (ZITHROMAX) 250 MG tablet    Sig: Take 1 tablet (250 mg total) by mouth once for 1 dose.    Dispense:  4 tablet    Refill:  0    Order Specific Question:   Supervising Provider    Answer:   Despina Hidden, LUTHER H [[id number]]  . amoxicillin (AMOXIL) 250 MG chewable tablet    Sig: Chew 2 tablets (500 mg total) by mouth 2 (two) times daily.    Dispense:  14 tablet    Refill:  0    Order Specific Question:   Supervising Provider    Answer:   Lazaro Arms [[id number]]   Labs/procedures today: Pap, 2nd IT  Plan:  Continue routine obstetrical care with urine culture and CT at next visit  Reviewed: Preterm labor symptoms and general obstetric precautions including but not limited to vaginal bleeding, contractions, leaking of fluid and fetal movement were reviewed in detail with the patient.  All questions were  answered. Has home bp cuff.  Check bp weekly, let us know if >140/90.   Follow-up: Return in about 4 weeks (around 05/11/2020) for LROB, in person.  Orders Placed This Encounter  Procedures  . Urine Culture  . INTEGRATED 2  . POC Urinalysis Dipstick OB   Arabella Merles CNM 04/13/2020 12:01 PM

## 2020-04-15 LAB — CYTOLOGY - PAP: Diagnosis: NEGATIVE

## 2020-04-16 LAB — INTEGRATED 2
AFP MoM: 1.19
Alpha-Fetoprotein: 60.3 ng/mL
Crown Rump Length: 66.5 mm
DIA MoM: 0.95
DIA Value: 171.7 pg/mL
Estriol, Unconjugated: 1.38 ng/mL
Gest. Age on Collection Date: 12.7 weeks
Gestational Age: 17.9 weeks
Maternal Age at EDD: 25.2 yr
Nuchal Translucency (NT): 1.3 mm
Nuchal Translucency MoM: 0.87
Number of Fetuses: 1
PAPP-A MoM: 1.8
PAPP-A Value: 2510.1 ng/mL
Test Results:: NEGATIVE
Weight: 121 [lb_av]
Weight: 121 [lb_av]
hCG MoM: 1.28
hCG Value: 37.9 IU/mL
uE3 MoM: 0.94

## 2020-04-27 ENCOUNTER — Encounter (HOSPITAL_COMMUNITY): Payer: Self-pay | Admitting: Obstetrics and Gynecology

## 2020-04-27 ENCOUNTER — Inpatient Hospital Stay (HOSPITAL_COMMUNITY)
Admission: AD | Admit: 2020-04-27 | Discharge: 2020-04-27 | Disposition: A | Discharge status: Home / Self Care | Payer: Medicaid Other | Attending: Obstetrics and Gynecology | Admitting: Obstetrics and Gynecology

## 2020-04-27 DIAGNOSIS — Y92410 Unspecified street and highway as the place of occurrence of the external cause: Secondary | ICD-10-CM | POA: Insufficient documentation

## 2020-04-27 DIAGNOSIS — M25512 Pain in left shoulder: Secondary | ICD-10-CM | POA: Insufficient documentation

## 2020-04-27 DIAGNOSIS — R519 Headache, unspecified: Secondary | ICD-10-CM | POA: Diagnosis not present

## 2020-04-27 DIAGNOSIS — O26892 Other specified pregnancy related conditions, second trimester: Secondary | ICD-10-CM | POA: Diagnosis present

## 2020-04-27 DIAGNOSIS — Z3A20 20 weeks gestation of pregnancy: Secondary | ICD-10-CM | POA: Insufficient documentation

## 2020-04-27 DIAGNOSIS — N390 Urinary tract infection, site not specified: Secondary | ICD-10-CM | POA: Diagnosis not present

## 2020-04-27 DIAGNOSIS — O2342 Unspecified infection of urinary tract in pregnancy, second trimester: Secondary | ICD-10-CM | POA: Insufficient documentation

## 2020-04-27 DIAGNOSIS — R109 Unspecified abdominal pain: Secondary | ICD-10-CM | POA: Insufficient documentation

## 2020-04-27 LAB — URINALYSIS, ROUTINE W REFLEX MICROSCOPIC
Bilirubin Urine: NEGATIVE
Glucose, UA: NEGATIVE mg/dL
Hgb urine dipstick: NEGATIVE
Ketones, ur: 5 mg/dL — AB
Nitrite: POSITIVE — AB
Protein, ur: NEGATIVE mg/dL
Specific Gravity, Urine: 1.018 (ref 1.005–1.030)
pH: 7 (ref 5.0–8.0)

## 2020-04-27 LAB — KLEIHAUER-BETKE STAIN
# Vials RhIg: 1
Fetal Cells %: 0 %
Quantitation Fetal Hemoglobin: 0 mL

## 2020-04-27 LAB — ABO/RH
ABO/RH(D): O NEG
Antibody Screen: NEGATIVE

## 2020-04-27 MED ORDER — LACTATED RINGERS IV SOLN: INTRAVENOUS | Status: DC

## 2020-04-27 MED ORDER — SULFAMETHOXAZOLE-TRIMETHOPRIM 800-160 MG PO TABS: 1.0000 | ORAL_TABLET | Freq: Two times a day (BID) | ORAL | 0 refills | Status: DC
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
## 2020-04-27 NOTE — Progress Notes (Signed)
Pt cleared by [facility] MD.  Transferred to MAU via WC for further toco monitoring.

## 2020-04-27 NOTE — Discharge Instructions (Signed)
Pregnancy and Urinary Tract Infection ° °A urinary tract infection (UTI) is an infection of any part of the urinary tract. This includes the kidneys, the tubes that connect your kidneys to your bladder (ureters), the bladder, and the tube that carries urine out of your body (urethra). These organs make, store, and get rid of urine in the body. Your health care provider may use other names to describe the infection. An upper UTI affects the ureters and kidneys (pyelonephritis). A lower UTI affects the bladder (cystitis) and urethra (urethritis). °Most urinary tract infections are caused by bacteria in your genital area, around the entrance to your urinary tract (urethra). These bacteria grow and cause irritation and inflammation of your urinary tract. You are more likely to develop a UTI during pregnancy because the physical and hormonal changes your body goes through can make it easier for bacteria to get into your urinary tract. Your growing baby also puts pressure on your bladder and can affect urine flow. It is important to recognize and treat UTIs in pregnancy because of the risk of serious complications for both you and your baby. °How does this affect me? °Symptoms of a UTI include: °· Needing to urinate right away (urgently). °· Frequent urination or passing small amounts of urine frequently. °· Pain or burning with urination. °· Blood in the urine. °· Urine that smells bad or unusual. °· Trouble urinating. °· Cloudy urine. °· Pain in the abdomen or lower back. °· Vaginal discharge. °You may also have: °· Vomiting or a decreased appetite. °· Confusion. °· Irritability or tiredness. °· A fever. °· Diarrhea. °How does this affect my baby? °An untreated UTI during pregnancy could lead to a kidney infection or a systemic infection, which can cause health problems that could affect your baby. Possible complications of an untreated UTI include: °· Giving birth to your baby before 37 weeks of pregnancy  (premature). °· Having a baby with a low birth weight. °· Developing high blood pressure during pregnancy (preeclampsia). °· Having a low hemoglobin level (anemia). °What can I do to lower my risk? °To prevent a UTI: °· Go to the bathroom as soon as you feel the need. Do not hold urine for long periods of time. °· Always wipe from front to back, especially after a bowel movement. Use each tissue one time when you wipe. °· Empty your bladder after sex. °· Keep your genital area dry. °· Drink 6-10 glasses of water each day. °· Do not douche or use deodorant sprays. °How is this treated? °Treatment for this condition may include: °· Antibiotic medicines that are safe to take during pregnancy. °· Other medicines to treat less common causes of UTI. °Follow these instructions at home: °· If you were prescribed an antibiotic medicine, take it as told by your health care provider. Do not stop using the antibiotic even if you start to feel better. °· Keep all follow-up visits as told by your health care provider. This is important. °Contact a health care provider if: °· Your symptoms do not improve or they get worse. °· You have abnormal vaginal discharge. °Get help right away if you: °· Have a fever. °· Have nausea and vomiting. °· Have back or side pain. °· Feel contractions in your uterus. °· Have lower belly pain. °· Have a gush of fluid from your vagina. °· Have blood in your urine. °Summary °· A urinary tract infection (UTI) is an infection of any part of the urinary tract, which includes the   kidneys, ureters, bladder, and urethra. °· Most urinary tract infections are caused by bacteria in your genital area, around the entrance to your urinary tract (urethra). °· You are more likely to develop a UTI during pregnancy. °· If you were prescribed an antibiotic medicine, take it as told by your health care provider. Do not stop using the antibiotic even if you start to feel better. °This information is not intended to  replace advice given to you by your health care provider. Make sure you discuss any questions you have with your health care provider. °Document Revised: 10/03/2018 Document Reviewed: 05/15/2018 °Elsevier Patient Education © 2020 [employer] Inc[employer] ° °
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
## 2020-04-27 NOTE — Progress Notes (Addendum)
Orthopedic Tech Progress Note Patient Details:  Chelsea Burgess [birth year] [id number] LEVEL 2 TRAUMA Patient ID: Precilla Purnell, female   DOB: 1995/07/13, 24 y.o.   MRN: [id number]   Donald Pore 04/27/2020, 12:12 PM
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
## 2020-04-28 ENCOUNTER — Encounter: Payer: Self-pay | Admitting: Advanced Practice Midwife
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
## 2020-04-29 LAB — CULTURE, OB URINE: Culture: >=100000 — AB

## 2020-05-04 ENCOUNTER — Encounter: Payer: Self-pay | Admitting: *Deleted

## 2020-05-04 DIAGNOSIS — Z3480 Encounter for supervision of other normal pregnancy, unspecified trimester: Secondary | ICD-10-CM

## 2020-05-11 ENCOUNTER — Encounter: Payer: Medicaid Other | Admitting: Advanced Practice Midwife

## 2020-05-12 ENCOUNTER — Encounter: Payer: Medicaid Other | Admitting: Advanced Practice Midwife

## 2020-05-25 ENCOUNTER — Ambulatory Visit (INDEPENDENT_AMBULATORY_CARE_PROVIDER_SITE_OTHER): Payer: Medicaid Other | Admitting: Advanced Practice Midwife

## 2020-05-25 ENCOUNTER — Other Ambulatory Visit: Payer: Self-pay

## 2020-05-25 VITALS — BP 116/74 | HR 94 | Wt 130.6 lb

## 2020-05-25 DIAGNOSIS — R8271 Bacteriuria: Secondary | ICD-10-CM

## 2020-05-25 DIAGNOSIS — Z1389 Encounter for screening for other disorder: Secondary | ICD-10-CM

## 2020-05-25 DIAGNOSIS — Z3A24 24 weeks gestation of pregnancy: Secondary | ICD-10-CM

## 2020-05-25 DIAGNOSIS — Z331 Pregnant state, incidental: Secondary | ICD-10-CM

## 2020-05-25 DIAGNOSIS — A749 Chlamydial infection, unspecified: Secondary | ICD-10-CM

## 2020-05-25 DIAGNOSIS — R87619 Unspecified abnormal cytological findings in specimens from cervix uteri: Secondary | ICD-10-CM

## 2020-05-25 DIAGNOSIS — O99891 Other specified diseases and conditions complicating pregnancy: Secondary | ICD-10-CM

## 2020-05-25 DIAGNOSIS — Z8619 Personal history of other infectious and parasitic diseases: Secondary | ICD-10-CM

## 2020-05-25 DIAGNOSIS — Z3480 Encounter for supervision of other normal pregnancy, unspecified trimester: Secondary | ICD-10-CM

## 2020-05-25 LAB — POCT URINALYSIS DIPSTICK OB
Blood, UA: NEGATIVE
Glucose, UA: NEGATIVE
Ketones, UA: NEGATIVE
Leukocytes, UA: NEGATIVE
Nitrite, UA: NEGATIVE
POC,PROTEIN,UA: NEGATIVE

## 2020-05-25 NOTE — Patient Instructions (Signed)
Chelsea Burgess, I greatly value your feedback.  If you receive a survey following your visit with [place] today, we appreciate you taking the time to fill it out.  Thanks, Philipp Deputy, CNM  [email or web address].org (for birth control options)  [facility] ([address] [place], [place] [address]) Entrance C, located off of [address] valet parking  Go to [email or web address] to register for FREE online childbirth classes  [place] Pediatricians/Family Doctors:  [facility] [phone]            [facility] [phone]                 [facility] [phone] (usually not accepting new patients unless you have family there already, you are always welcome to call and ask)       [facility] Department [phone]       [facility] Pediatricians/Family Doctors:   [facility]: [phone]  [facility]: [phone]  [facility]: [phone]  [facility] Doctors:   [facility]: [phone]   [facility]: [phone]  [facility] Doctors:  [facility]: [phone]    Home Blood Pressure Monitoring for Patients   Your provider has recommended that you check your blood pressure (BP) at least once a week at home. If you do not have a blood pressure cuff at home, one will be provided for you. Contact your provider if you have not received your monitor within 1 week.   Helpful Tips for Accurate Home Blood Pressure Checks  . Don't smoke, exercise, or drink caffeine 30 minutes before checking your BP . Use the restroom before checking your BP (a full bladder can raise your pressure) . Relax in a comfortable upright chair . Feet on the ground . Left arm resting comfortably on a flat surface at the level of your heart . Legs uncrossed . Back supported . Sit quietly and don't talk . Place the cuff  on your bare arm . Adjust snuggly, so that only two fingertips can fit between your skin and the top of the cuff . Check 2 readings separated by at least one minute . Keep a log of your BP readings . For a visual, please reference this diagram: http://ccnc.care/bpdiagram  Provider Name: [facility]     Phone: [phone]  Zone 1: ALL CLEAR  Continue to monitor your symptoms:  . BP reading is less than 140 (top number) or less than 90 (bottom number)  . No right upper stomach pain . No headaches or seeing spots . No feeling nauseated or throwing up . No swelling in face and hands  Zone 2: CAUTION Call your doctor's office for any of the following:  . BP reading is greater than 140 (top number) or greater than 90 (bottom number)  . Stomach pain under your ribs in the middle or right side . Headaches or seeing spots . Feeling nauseated or throwing up . Swelling in face and hands  Zone 3: EMERGENCY  Seek immediate medical care if you have any of the following:  . BP reading is greater than160 (top number) or greater than 110 (bottom number) . Severe headaches not improving with Tylenol . Serious difficulty catching your breath . Any worsening symptoms from Zone 2     Second Trimester of Pregnancy The second trimester is from week 14 through week 27 (months 4 through 6). The second trimester is often a  time when you feel your best. Your body has adjusted to being pregnant, and you begin to feel better physically. Usually, morning sickness has lessened or quit completely, you may have more energy, and you may have an increase in appetite. The second trimester is also a time when the fetus is growing rapidly. At the end of the sixth month, the fetus is about 9 inches long and weighs about 1 pounds. You will likely begin to feel the baby move (quickening) between 16 and 20 weeks of pregnancy. Body changes during your second trimester Your body continues to go through many changes  during your second trimester. The changes vary from woman to woman.  Your weight will continue to increase. You will notice your lower abdomen bulging out.  You may begin to get stretch marks on your hips, abdomen, and breasts.  You may develop headaches that can be relieved by medicines. The medicines should be approved by your health care provider.  You may urinate more often because the fetus is pressing on your bladder.  You may develop or continue to have heartburn as a result of your pregnancy.  You may develop constipation because certain hormones are causing the muscles that push waste through your intestines to slow down.  You may develop hemorrhoids or swollen, bulging veins (varicose veins).  You may have back pain. This is caused by: ? Weight gain. ? Pregnancy hormones that are relaxing the joints in your pelvis. ? A shift in weight and the muscles that support your balance.  Your breasts will continue to grow and they will continue to become tender.  Your gums may bleed and may be sensitive to brushing and flossing.  Dark spots or blotches (chloasma, mask of pregnancy) may develop on your face. This will likely fade after the baby is born.  A dark line from your belly button to the pubic area (linea nigra) may appear. This will likely fade after the baby is born.  You may have changes in your hair. These can include thickening of your hair, rapid growth, and changes in texture. Some women also have hair loss during or after pregnancy, or hair that feels dry or thin. Your hair will most likely return to normal after your baby is born.  What to expect at prenatal visits During a routine prenatal visit:  You will be weighed to make sure you and the fetus are growing normally.  Your blood pressure will be taken.  Your abdomen will be measured to track your baby's growth.  The fetal heartbeat will be listened to.  Any test results from the previous visit will be  discussed.  Your health care provider may ask you:  How you are feeling.  If you are feeling the baby move.  If you have had any abnormal symptoms, such as leaking fluid, bleeding, severe headaches, or abdominal cramping.  If you are using any tobacco products, including cigarettes, chewing tobacco, and electronic cigarettes.  If you have any questions.  Other tests that may be performed during your second trimester include:  Blood tests that check for: ? Low iron levels (anemia). ? High blood sugar that affects pregnant women (gestational diabetes) between 33 and 28 weeks. ? Rh antibodies. This is to check for a protein on red blood cells (Rh factor).  Urine tests to check for infections, diabetes, or protein in the urine.  An ultrasound to confirm the proper growth and development of the baby.  An amniocentesis to check for  possible genetic problems.  Fetal screens for spina bifida and Down syndrome.  HIV (human immunodeficiency virus) testing. Routine prenatal testing includes screening for HIV, unless you choose not to have this test.  Follow these instructions at home: Medicines  Follow your health care provider's instructions regarding medicine use. Specific medicines may be either safe or unsafe to take during pregnancy.  Take a prenatal vitamin that contains at least 600 micrograms (mcg) of folic acid.  If you develop constipation, try taking a stool softener if your health care provider approves. Eating and drinking  Eat a balanced diet that includes fresh fruits and vegetables, whole grains, good sources of protein such as meat, eggs, or tofu, and low-fat dairy. Your health care provider will help you determine the amount of weight gain that is right for you.  Avoid raw meat and uncooked cheese. These carry germs that can cause birth defects in the baby.  If you have low calcium intake from food, talk to your health care provider about whether you should take a  daily calcium supplement.  Limit foods that are high in fat and processed sugars, such as fried and sweet foods.  To prevent constipation: ? Drink enough fluid to keep your urine clear or pale yellow. ? Eat foods that are high in fiber, such as fresh fruits and vegetables, whole grains, and beans. Activity  Exercise only as directed by your health care provider. Most women can continue their usual exercise routine during pregnancy. Try to exercise for 30 minutes at least 5 days a week. Stop exercising if you experience uterine contractions.  Avoid heavy lifting, wear low heel shoes, and practice good posture.  A sexual relationship may be continued unless your health care provider directs you otherwise. Relieving pain and discomfort  Wear a good support bra to prevent discomfort from breast tenderness.  Take warm sitz baths to soothe any pain or discomfort caused by hemorrhoids. Use hemorrhoid cream if your health care provider approves.  Rest with your legs elevated if you have leg cramps or low back pain.  If you develop varicose veins, wear support hose. Elevate your feet for 15 minutes, 3-4 times a day. Limit salt in your diet. Prenatal Care  Write down your questions. Take them to your prenatal visits.  Keep all your prenatal visits as told by your health care provider. This is important. Safety  Wear your seat belt at all times when driving.  Make a list of emergency phone numbers, including numbers for family, friends, the hospital, and police and fire departments. General instructions  Ask your health care provider for a referral to a local prenatal education class. Begin classes no later than the beginning of month 6 of your pregnancy.  Ask for help if you have counseling or nutritional needs during pregnancy. Your health care provider can offer advice or refer you to specialists for help with various needs.  Do not use hot tubs, steam rooms, or saunas.  Do not  douche or use tampons or scented sanitary pads.  Do not cross your legs for long periods of time.  Avoid cat litter boxes and soil used by cats. These carry germs that can cause birth defects in the baby and possibly loss of the fetus by miscarriage or stillbirth.  Avoid all smoking, herbs, alcohol, and unprescribed drugs. Chemicals in these products can affect the formation and growth of the baby.  Do not use any products that contain nicotine or tobacco, such as cigarettes and  e-cigarettes. If you need help quitting, ask your health care provider.  Visit your dentist if you have not gone yet during your pregnancy. Use a soft toothbrush to brush your teeth and be gentle when you floss. Contact a health care provider if:  You have dizziness.  You have mild pelvic cramps, pelvic pressure, or nagging pain in the abdominal area.  You have persistent nausea, vomiting, or diarrhea.  You have a bad smelling vaginal discharge.  You have pain when you urinate. Get help right away if:  You have a fever.  You are leaking fluid from your vagina.  You have spotting or bleeding from your vagina.  You have severe abdominal cramping or pain.  You have rapid weight gain or weight loss.  You have shortness of breath with chest pain.  You notice sudden or extreme swelling of your face, hands, ankles, feet, or legs.  You have not felt your baby move in over an hour.  You have severe headaches that do not go away when you take medicine.  You have vision changes. Summary  The second trimester is from week 14 through week 27 (months 4 through 6). It is also a time when the fetus is growing rapidly.  Your body goes through many changes during pregnancy. The changes vary from woman to woman.  Avoid all smoking, herbs, alcohol, and unprescribed drugs. These chemicals affect the formation and growth your baby.  Do not use any tobacco products, such as cigarettes, chewing tobacco, and  e-cigarettes. If you need help quitting, ask your health care provider.  Contact your health care provider if you have any questions. Keep all prenatal visits as told by your health care provider. This is important. This information is not intended to replace advice given to you by your health care provider. Make sure you discuss any questions you have with your health care provider. Document Released: 06/05/2001 Document Revised: 11/17/2015 Document Reviewed: 08/12/2012 Elsevier Interactive Patient Education  2017 [employer].

## 2020-05-25 NOTE — Progress Notes (Signed)
   LOW-RISK PREGNANCY VISIT Patient name: Chelsea Burgess MRN [id number]  Date of birth: [birth year] Chief Complaint:   Routine Prenatal Visit  History of Present Illness:   Chelsea Burgess is a 24 y.o. [id number] female at [redacted]w[redacted]d with an Estimated Date of Delivery: 09/14/20 being seen today for ongoing management of a low-risk pregnancy.  Today she reports doing pretty good; was able to take Bactrim from UTI 11/3. Contractions: Not present. Vag. Bleeding: None.  Movement: Present. denies leaking of fluid. Review of Systems:   Pertinent items are noted in HPI Denies abnormal vaginal discharge w/ itching/odor/irritation, headaches, visual changes, shortness of breath, chest pain, abdominal pain, severe nausea/vomiting, or problems with urination or bowel movements unless otherwise stated above. Pertinent History Reviewed:  Reviewed past medical,surgical, social, obstetrical and family history.  Reviewed problem list, medications and allergies. Physical Assessment:   Vitals:   05/25/20 1422  BP: 116/74  Pulse: 94  Weight: 130 lb 9.6 oz (59.2 kg)  Body mass index is 20.45 kg/m.        Physical Examination:   General appearance: Well appearing, and in no distress  Mental status: Alert, oriented to person, place, and time  Skin: Warm & dry  Cardiovascular: Normal heart rate noted  Respiratory: Normal respiratory effort, no distress  Abdomen: Soft, gravid, nontender  Pelvic: Cervical exam deferred         Extremities: Edema: None  Fetal Status: Fetal Heart Rate (bpm): 141 Fundal Height: 24 cm Movement: Present    Results for orders placed or performed in visit on 05/25/20 (from the past 24 hour(s))  POC Urinalysis Dipstick OB   Collection Time: 05/25/20  2:21 PM  Result Value Ref Range   Color, UA     Clarity, UA     Glucose, UA Negative Negative   Bilirubin, UA     Ketones, UA neg    Spec Grav, UA     Blood, UA neg    pH, UA     POC,PROTEIN,UA Negative Negative, Trace, Small  (1+), Moderate (2+), Large (3+), 4+   Urobilinogen, UA     Nitrite, UA neg    Leukocytes, UA Negative Negative   Appearance     Odor      Assessment & Plan:  1) Low-risk pregnancy [id number] at [redacted]w[redacted]d with an Estimated Date of Delivery: 09/14/20   2) ASB, POC today  3) Chlamydia tx 04/13/20, POC today  4) Rh neg, Rhogam at 28wks   Meds: No orders of the defined types were placed in this encounter.  Labs/procedures today: none  Plan:  Continue routine obstetrical care   Reviewed: Preterm labor symptoms and general obstetric precautions including but not limited to vaginal bleeding, contractions, leaking of fluid and fetal movement were reviewed in detail with the patient.  All questions were answered. Has home bp cuff.  Check bp weekly, let us know if >140/90.   Follow-up: Return in about 3 weeks (around 06/15/2020) for PN2, in person, LROB.  Orders Placed This Encounter  Procedures  . Urine Culture  . GC/Chlamydia Probe Amp  . POC Urinalysis Dipstick OB   Arabella Merles [facility] 05/25/2020 2:43 PM

## 2020-05-29 ENCOUNTER — Other Ambulatory Visit: Payer: Self-pay | Admitting: Advanced Practice Midwife

## 2020-05-29 MED ORDER — CEFTRIAXONE SODIUM 1 G IJ SOLR
1.0000 g | Freq: Once | INTRAMUSCULAR | 0 refills | Status: AC
Start: 2020-05-29 — End: 2020-05-29

## 2020-05-30 ENCOUNTER — Ambulatory Visit (INDEPENDENT_AMBULATORY_CARE_PROVIDER_SITE_OTHER): Payer: Medicaid Other | Admitting: *Deleted

## 2020-05-30 ENCOUNTER — Telehealth: Payer: Self-pay | Admitting: *Deleted

## 2020-05-30 ENCOUNTER — Encounter: Payer: Self-pay | Admitting: *Deleted

## 2020-05-30 ENCOUNTER — Other Ambulatory Visit: Payer: Self-pay

## 2020-05-30 VITALS — BP 111/79 | HR 75 | Ht 67.0 in | Wt 132.5 lb

## 2020-05-30 DIAGNOSIS — O26892 Other specified pregnancy related conditions, second trimester: Secondary | ICD-10-CM

## 2020-05-30 DIAGNOSIS — B9620 Unspecified Escherichia coli [E. coli] as the cause of diseases classified elsewhere: Secondary | ICD-10-CM

## 2020-05-30 DIAGNOSIS — N390 Urinary tract infection, site not specified: Secondary | ICD-10-CM

## 2020-05-30 DIAGNOSIS — Z3480 Encounter for supervision of other normal pregnancy, unspecified trimester: Secondary | ICD-10-CM

## 2020-05-30 MED ORDER — CEFTRIAXONE SODIUM 1 G IJ SOLR
1.0000 g | Freq: Once | INTRAMUSCULAR | Status: AC
  Administered 2020-05-30: 1 g via INTRAMUSCULAR

## 2020-05-30 NOTE — Progress Notes (Addendum)
   NURSE VISIT- INJECTION  SUBJECTIVE:  Chelsea Burgess is a 24 y.o. [phone] female here for a Rocephin for per provider order. She has E.coli UTI. She is [redacted]w[redacted]d pregnant.   OBJECTIVE:  BP 111/79 (BP Location: Left Arm, Patient Position: Sitting, Cuff Size: Normal)   Pulse 75   Ht 5\' 7"  (1.702 m)   Wt 132 lb 8 oz (60.1 kg)   LMP 12/09/2019 (Exact Date)   Breastfeeding No   BMI 20.75 kg/m   Appears well, in no apparent distress  Injection administered in: Left upper quad. gluteus  Meds ordered this encounter  Medications  . cefTRIAXone (ROCEPHIN) injection 1 g    Order Specific Question:   Antibiotic Indication:    Answer:   UTI    ASSESSMENT: Pregnancy [redacted]w[redacted]d Rocephin for per provider order. She has E.coli UTI.  PLAN: Follow-up: as scheduled   [redacted]w[redacted]d  05/30/2020 5:05 PM   Attestation of Attending Supervision of Nursing Visit Encounter: Evaluation and management procedures were performed by the nursing staff under my supervision and collaboration.  I have reviewed the nurse's note and chart, and I agree with the management and plan.  14/11/2019 MD Attending Physician for the Center for [facility] 05/31/2020 11:13 AM

## 2020-05-30 NOTE — Telephone Encounter (Signed)
LMOVM for patient to call and schedule injection for Rocephin in the office.

## 2020-05-31 LAB — URINE CULTURE

## 2020-05-31 LAB — GC/CHLAMYDIA PROBE AMP
Chlamydia trachomatis, NAA: NEGATIVE
Neisseria Gonorrhoeae by PCR: NEGATIVE

## 2020-05-31 LAB — SPECIMEN STATUS REPORT

## 2020-06-14 ENCOUNTER — Other Ambulatory Visit: Payer: Medicaid Other

## 2020-06-14 ENCOUNTER — Encounter: Payer: Medicaid Other | Admitting: Women's Health

## 2020-06-25 NOTE — L&D Delivery Note (Addendum)
OB/GYN Faculty Practice Delivery Note  Chelsea Burgess is a 25 y.o. G3P1011 s/p VD at [redacted]w[redacted]d. She was admitted for IOL in the s/o postdates.  ROM: 0h 47m with clear fluid (delivered en caul) GBS Status: negative Maximum Maternal Temperature: 98.7 F  Labor Progress: Induction with cytotec, pitocin, and IP foley bulb.  Delivery Date/Time: 09/21/20 at 2301 Delivery: Called to room and patient was complete and pushing. Head delivered LOA. And was en caul. No nuchal cord present. Shoulder and body delivered in usual fashion. Infant with spontaneous cry, placed on mother's abdomen, dried and stimulated. Cord clamped x 2 after 1-minute delay, and cut by FOB under my direct supervision. Cord blood drawn. Placenta delivered spontaneously with gentle cord traction. Fundus firm with massage and Pitocin. Labia, perineum, vagina, and cervix were inspected, no lacerations. She requested a postplacental IUD placement which she tolerated well (see procedure note).  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 She progressed with induction via cytotec followed by Pitocin held at low dose, and then a cervical foley insertion.  I was gloved and present for delivery in its entirety.  Second stage of labor progressed, baby delivered en caul with clear fluid after pushing with <10 contractions.  No decels during second stage noted.  Complications: none  Lacerations: none  EBL: 100cc  She requested a postplacental Liletta placement which she tolerated well.  Chelsea Burgess, CNM 11:39 PM  09/21/2020

## 2020-07-04 ENCOUNTER — Other Ambulatory Visit: Payer: Medicaid Other

## 2020-07-04 ENCOUNTER — Encounter: Payer: Self-pay | Admitting: Women's Health

## 2020-07-04 ENCOUNTER — Other Ambulatory Visit: Payer: Self-pay

## 2020-07-04 ENCOUNTER — Ambulatory Visit (INDEPENDENT_AMBULATORY_CARE_PROVIDER_SITE_OTHER): Payer: Medicaid Other | Admitting: Women's Health

## 2020-07-04 VITALS — BP 109/76 | HR 81 | Wt 137.0 lb

## 2020-07-04 DIAGNOSIS — Z3A29 29 weeks gestation of pregnancy: Secondary | ICD-10-CM

## 2020-07-04 DIAGNOSIS — Z131 Encounter for screening for diabetes mellitus: Secondary | ICD-10-CM

## 2020-07-04 DIAGNOSIS — B9620 Unspecified Escherichia coli [E. coli] as the cause of diseases classified elsewhere: Secondary | ICD-10-CM

## 2020-07-04 DIAGNOSIS — Z1389 Encounter for screening for other disorder: Secondary | ICD-10-CM

## 2020-07-04 DIAGNOSIS — Z3483 Encounter for supervision of other normal pregnancy, third trimester: Secondary | ICD-10-CM

## 2020-07-04 DIAGNOSIS — Z331 Pregnant state, incidental: Secondary | ICD-10-CM

## 2020-07-04 DIAGNOSIS — N390 Urinary tract infection, site not specified: Secondary | ICD-10-CM

## 2020-07-04 DIAGNOSIS — Z3480 Encounter for supervision of other normal pregnancy, unspecified trimester: Secondary | ICD-10-CM

## 2020-07-04 DIAGNOSIS — Z113 Encounter for screening for infections with a predominantly sexual mode of transmission: Secondary | ICD-10-CM

## 2020-07-04 LAB — POCT URINALYSIS DIPSTICK OB
Glucose, UA: NEGATIVE
Ketones, UA: NEGATIVE
Nitrite, UA: NEGATIVE
POC,PROTEIN,UA: NEGATIVE

## 2020-07-04 NOTE — Progress Notes (Signed)
LOW-RISK PREGNANCY VISIT Patient name: Chelsea Burgess MRN [id number]  Date of birth: Jan 04, 1996 Chief Complaint:   Routine Prenatal Visit (PN2 today)  History of Present Illness:   Chelsea Burgess is a 25 y.o. G2P1011 female at [redacted]w[redacted]d with an Estimated Date of Delivery: 09/14/20 being seen today for ongoing management of a low-risk pregnancy.  Depression screen [facility] 2/9 07/04/2020 03/14/2020 06/19/2018  Decreased Interest 0 0 0  Down, Depressed, Hopeless 0 0 0  PHQ - 2 Score 0 0 0  Altered sleeping 1 0 0  Tired, decreased energy 1 1 1   Change in appetite 0 1 0  Feeling bad or failure about yourself  0 0 0  Trouble concentrating 0 0 0  Moving slowly or fidgety/restless 0 0 0  Suicidal thoughts 0 0 0  PHQ-9 Score 2 2 1     Today she reports no complaints. Wants gc/ct testing, had sex w/ partner, not sure if he actually took his medicine or not. No UTI sx.  Contractions: Not present. Vag. Bleeding: None.  Movement: Present. denies leaking of fluid. Review of Systems:   Pertinent items are noted in HPI Denies abnormal vaginal discharge w/ itching/odor/irritation, headaches, visual changes, shortness of breath, chest pain, abdominal pain, severe nausea/vomiting, or problems with urination or bowel movements unless otherwise stated above. Pertinent History Reviewed:  Reviewed past medical,surgical, social, obstetrical and family history.  Reviewed problem list, medications and allergies. Physical Assessment:   Vitals:   07/04/20 0931  BP: 109/76  Pulse: 81  Weight: 137 lb (62.1 kg)  Body mass index is 21.46 kg/m.        Physical Examination:   General appearance: Well appearing, and in no distress  Mental status: Alert, oriented to person, place, and time  Skin: Warm & dry  Cardiovascular: Normal heart rate noted  Respiratory: Normal respiratory effort, no distress  Abdomen: Soft, gravid, nontender  Pelvic: Cervical exam deferred         Extremities: Edema: None  Fetal  Status: Fetal Heart Rate (bpm): 134 Fundal Height: 27 cm Movement: Present    Chaperone: N/A   Results for orders placed or performed in visit on 07/04/20 (from the past 24 hour(s))  POC Urinalysis Dipstick OB   Collection Time: 07/04/20  9:32 AM  Result Value Ref Range   Color, UA     Clarity, UA     Glucose, UA Negative Negative   Bilirubin, UA     Ketones, UA neg    Spec Grav, UA     Blood, UA trace    pH, UA     POC,PROTEIN,UA Negative Negative, Trace, Small (1+), Moderate (2+), Large (3+), 4+   Urobilinogen, UA     Nitrite, UA neg    Leukocytes, UA Moderate (2+) (A) Negative   Appearance     Odor      Assessment & Plan:  1) Low-risk pregnancy G3P1011 at [redacted]w[redacted]d with an Estimated Date of Delivery: 09/14/20   2) STD screen, gc/ct  3) Recent urine +urine cx> s/p rocephin 1gm, repeat urine cx today   Meds: No orders of the defined types were placed in this encounter.  Labs/procedures today: urine cx, pn2, declines tdap/flu  Plan:  Continue routine obstetrical care  Next visit: prefers online    Reviewed: Preterm labor symptoms and general obstetric precautions including but not limited to vaginal bleeding, contractions, leaking of fluid and fetal movement were reviewed in detail with the patient.  All questions were answered.  Has home bp cuff. Check bp weekly, let us know if >140/90.   Follow-up: Return for Wed-Fri for rhogam, then 4wks LROB w/ CNM online.  Future Appointments  Date Time Provider Department Center  07/07/2020  2:50 PM [facility] NURSE [facility]  08/01/2020  8:30 AM Cheral Marker, CNM [facility]    Orders Placed This Encounter  Procedures  . Urine Culture  . GC/Chlamydia Probe Amp  . POC Urinalysis Dipstick OB   Cheral Marker CNM, [facility] 07/04/2020 10:01 AM

## 2020-07-04 NOTE — Patient Instructions (Signed)
Chelsea Burgess, I greatly value your feedback.  If you receive a survey following your visit with [place] today, we appreciate you taking the time to fill it out.  Thanks, [name], CNM, WHNP-BC   [facility] ([address] [place], [place] [address]) Entrance C, located off of [address] valet parking  Go to [email or web address] to register for FREE online childbirth classes   Call the office [phone]) or go to [facility] if:  You begin to have strong, frequent contractions  Your water breaks.  Sometimes it is a big gush of fluid, sometimes it is just a trickle that keeps getting your panties wet or running down your legs  You have vaginal bleeding.  It is normal to have a small amount of spotting if your cervix was checked.   You don't feel your baby moving like normal.  If you don't, get you something to eat and drink and lay down and focus on feeling your baby move.  You should feel at least 10 movements in 2 hours.  If you don't, you should call the office or go to [facility].    Tdap Vaccine  It is recommended that you get the Tdap vaccine during the third trimester of EACH pregnancy to help protect your baby from getting pertussis (whooping cough)  27-36 weeks is the BEST time to do this so that you can pass the protection on to your baby. During pregnancy is better than after pregnancy, but if you are unable to get it during pregnancy it will be offered at the hospital.   You can get this vaccine with [place], at the health department, your family doctor, or some local pharmacies  Everyone who will be around your baby should also be up-to-date on their vaccines before the baby comes. Adults (who are not pregnant) only need 1 dose of Tdap during adulthood.   [place] Pediatricians/Family Doctors:  [facility] [phone]            [facility] [phone]                 [facility]  [phone] (usually not accepting new patients unless you have family there already, you are always welcome to call and ask)       [facility] Department [phone]       [facility] Pediatricians/Family Doctors:   [facility]: [phone]  [facility]: [phone]  [facility]: [phone]  [facility] Doctors:   [facility]: [phone]   [facility]: [phone]  [facility] Doctors:  [facility]: [phone]   Home Blood Pressure Monitoring for Patients   Your provider has recommended that you check your blood pressure (BP) at least once a week at home. If you do not have a blood pressure cuff at home, one will be provided for you. Contact your provider if you have not received your monitor within 1 week.   Helpful Tips for Accurate Home Blood Pressure Checks  . Don't smoke, exercise, or drink caffeine 30 minutes before checking your BP . Use the restroom before checking your BP (a full bladder can raise your pressure) . Relax in a comfortable upright chair . Feet on the ground . Left arm resting comfortably on a flat surface at the level of your heart . Legs uncrossed . Back supported . Sit quietly and don't talk . Place the cuff on your  bare arm . Adjust snuggly, so that only two fingertips can fit between your skin and the top of the cuff . Check 2 readings separated by at least one minute . Keep a log of your BP readings . For a visual, please reference this diagram: http://ccnc.care/bpdiagram  Provider Name: [facility]     Phone: [phone]  Zone 1: ALL CLEAR  Continue to monitor your symptoms:  . BP reading is less than 140 (top number) or less than 90 (bottom number)  . No right upper stomach pain . No headaches or seeing spots . No feeling nauseated or throwing up . No swelling in face and hands  Zone 2: CAUTION Call your  doctor's office for any of the following:  . BP reading is greater than 140 (top number) or greater than 90 (bottom number)  . Stomach pain under your ribs in the middle or right side . Headaches or seeing spots . Feeling nauseated or throwing up . Swelling in face and hands  Zone 3: EMERGENCY  Seek immediate medical care if you have any of the following:  . BP reading is greater than160 (top number) or greater than 110 (bottom number) . Severe headaches not improving with Tylenol . Serious difficulty catching your breath . Any worsening symptoms from Zone 2   Third Trimester of Pregnancy The third trimester is from week 29 through week 42, months 7 through 9. The third trimester is a time when the fetus is growing rapidly. At the end of the ninth month, the fetus is about 20 inches in length and weighs 6-10 pounds.  BODY CHANGES Your body goes through many changes during pregnancy. The changes vary from woman to woman.   Your weight will continue to increase. You can expect to gain 25-35 pounds (11-16 kg) by the end of the pregnancy.  You may begin to get stretch marks on your hips, abdomen, and breasts.  You may urinate more often because the fetus is moving lower into your pelvis and pressing on your bladder.  You may develop or continue to have heartburn as a result of your pregnancy.  You may develop constipation because certain hormones are causing the muscles that push waste through your intestines to slow down.  You may develop hemorrhoids or swollen, bulging veins (varicose veins).  You may have pelvic pain because of the weight gain and pregnancy hormones relaxing your joints between the bones in your pelvis. Backaches may result from overexertion of the muscles supporting your posture.  You may have changes in your hair. These can include thickening of your hair, rapid growth, and changes in texture. Some women also have hair loss during or after pregnancy, or hair that  feels dry or thin. Your hair will most likely return to normal after your baby is born.  Your breasts will continue to grow and be tender. A yellow discharge may leak from your breasts called colostrum.  Your belly button may stick out.  You may feel short of breath because of your expanding uterus.  You may notice the fetus "dropping," or moving lower in your abdomen.  You may have a bloody mucus discharge. This usually occurs a few days to a week before labor begins.  Your cervix becomes thin and soft (effaced) near your due date. WHAT TO EXPECT AT YOUR PRENATAL EXAMS  You will have prenatal exams every 2 weeks until week 36. Then, you will have weekly prenatal exams. During a routine prenatal visit:  You will be weighed to make sure you and the fetus are growing normally.  Your blood pressure is taken.  Your abdomen will be measured to track your baby's growth.  The fetal heartbeat will be listened to.  Any test results from the previous visit will be discussed.  You may have a cervical check near your due date to see if you have effaced. At around 36 weeks, your caregiver will check your cervix. At the same time, your caregiver will also perform a test on the secretions of the vaginal tissue. This test is to determine if a type of bacteria, Group B streptococcus, is present. Your caregiver will explain this further. Your caregiver may ask you:  What your birth plan is.  How you are feeling.  If you are feeling the baby move.  If you have had any abnormal symptoms, such as leaking fluid, bleeding, severe headaches, or abdominal cramping.  If you have any questions. Other tests or screenings that may be performed during your third trimester include:  Blood tests that check for low iron levels (anemia).  Fetal testing to check the health, activity level, and growth of the fetus. Testing is done if you have certain medical conditions or if there are problems during the  pregnancy. FALSE LABOR You may feel small, irregular contractions that eventually go away. These are called Braxton Hicks contractions, or false labor. Contractions may last for hours, days, or even weeks before true labor sets in. If contractions come at regular intervals, intensify, or become painful, it is best to be seen by your caregiver.  SIGNS OF LABOR   Menstrual-like cramps.  Contractions that are 5 minutes apart or less.  Contractions that start on the top of the uterus and spread down to the lower abdomen and back.  A sense of increased pelvic pressure or back pain.  A watery or bloody mucus discharge that comes from the vagina. If you have any of these signs before the 37th week of pregnancy, call your caregiver right away. You need to go to the hospital to get checked immediately. HOME CARE INSTRUCTIONS   Avoid all smoking, herbs, alcohol, and unprescribed drugs. These chemicals affect the formation and growth of the baby.  Follow your caregiver's instructions regarding medicine use. There are medicines that are either safe or unsafe to take during pregnancy.  Exercise only as directed by your caregiver. Experiencing uterine cramps is a good sign to stop exercising.  Continue to eat regular, healthy meals.  Wear a good support bra for breast tenderness.  Do not use hot tubs, steam rooms, or saunas.  Wear your seat belt at all times when driving.  Avoid raw meat, uncooked cheese, cat litter boxes, and soil used by cats. These carry germs that can cause birth defects in the baby.  Take your prenatal vitamins.  Try taking a stool softener (if your caregiver approves) if you develop constipation. Eat more high-fiber foods, such as fresh vegetables or fruit and whole grains. Drink plenty of fluids to keep your urine clear or pale yellow.  Take warm sitz baths to soothe any pain or discomfort caused by hemorrhoids. Use hemorrhoid cream if your caregiver approves.  If you  develop varicose veins, wear support hose. Elevate your feet for 15 minutes, 3-4 times a day. Limit salt in your diet.  Avoid heavy lifting, wear low heal shoes, and practice good posture.  Rest a lot with your legs elevated if you have leg cramps or low  back pain.  Visit your dentist if you have not gone during your pregnancy. Use a soft toothbrush to brush your teeth and be gentle when you floss.  A sexual relationship may be continued unless your caregiver directs you otherwise.  Do not travel far distances unless it is absolutely necessary and only with the approval of your caregiver.  Take prenatal classes to understand, practice, and ask questions about the labor and delivery.  Make a trial run to the hospital.  Pack your hospital bag.  Prepare the baby's nursery.  Continue to go to all your prenatal visits as directed by your caregiver. SEEK MEDICAL CARE IF:  You are unsure if you are in labor or if your water has broken.  You have dizziness.  You have mild pelvic cramps, pelvic pressure, or nagging pain in your abdominal area.  You have persistent nausea, vomiting, or diarrhea.  You have a bad smelling vaginal discharge.  You have pain with urination. SEEK IMMEDIATE MEDICAL CARE IF:   You have a fever.  You are leaking fluid from your vagina.  You have spotting or bleeding from your vagina.  You have severe abdominal cramping or pain.  You have rapid weight loss or gain.  You have shortness of breath with chest pain.  You notice sudden or extreme swelling of your face, hands, ankles, feet, or legs.  You have not felt your baby move in over an hour.  You have severe headaches that do not go away with medicine.  You have vision changes. Document Released: 06/05/2001 Document Revised: 06/16/2013 Document Reviewed: 08/12/2012 [facility] Patient Information 2015 [place], [place]. This information is not intended to replace advice given to you by your health  care provider. Make sure you discuss any questions you have with your health care provider.

## 2020-07-05 LAB — CBC
Hematocrit: 32.5 % — ABNORMAL LOW (ref 34.0–46.6)
Hemoglobin: 11 g/dL — ABNORMAL LOW (ref 11.1–15.9)
MCH: 29.3 pg (ref 26.6–33.0)
MCHC: 33.8 g/dL (ref 31.5–35.7)
MCV: 87 fL (ref 79–97)
Platelets: 217 10*3/uL (ref 150–450)
RBC: 3.75 x10E6/uL — ABNORMAL LOW (ref 3.77–5.28)
RDW: 12.3 % (ref 11.7–15.4)
WBC: 8.7 10*3/uL (ref 3.4–10.8)

## 2020-07-05 LAB — HIV ANTIBODY (ROUTINE TESTING W REFLEX): HIV Screen 4th Generation wRfx: NONREACTIVE

## 2020-07-05 LAB — GLUCOSE TOLERANCE, 2 HOURS W/ 1HR
Glucose, 1 hour: 124 mg/dL (ref 65–179)
Glucose, 2 hour: 86 mg/dL (ref 65–152)
Glucose, Fasting: 76 mg/dL (ref 65–91)

## 2020-07-05 LAB — GC/CHLAMYDIA PROBE AMP
Chlamydia trachomatis, NAA: NEGATIVE
Neisseria Gonorrhoeae by PCR: NEGATIVE

## 2020-07-05 LAB — RPR: RPR Ser Ql: NONREACTIVE

## 2020-07-05 LAB — ANTIBODY SCREEN: Antibody Screen: NEGATIVE

## 2020-07-07 ENCOUNTER — Ambulatory Visit (INDEPENDENT_AMBULATORY_CARE_PROVIDER_SITE_OTHER): Payer: Medicaid Other

## 2020-07-07 ENCOUNTER — Other Ambulatory Visit: Payer: Self-pay

## 2020-07-07 VITALS — BP 115/75 | HR 83 | Wt 136.2 lb

## 2020-07-07 DIAGNOSIS — Z1389 Encounter for screening for other disorder: Secondary | ICD-10-CM | POA: Diagnosis not present

## 2020-07-07 DIAGNOSIS — Z3A30 30 weeks gestation of pregnancy: Secondary | ICD-10-CM | POA: Diagnosis not present

## 2020-07-07 DIAGNOSIS — O360131 Maternal care for anti-D [Rh] antibodies, third trimester, fetus 1: Secondary | ICD-10-CM | POA: Diagnosis not present

## 2020-07-07 DIAGNOSIS — Z331 Pregnant state, incidental: Secondary | ICD-10-CM

## 2020-07-07 LAB — POCT URINALYSIS DIPSTICK OB
Blood, UA: NEGATIVE
Glucose, UA: NEGATIVE
Ketones, UA: NEGATIVE
Nitrite, UA: NEGATIVE

## 2020-07-07 NOTE — Progress Notes (Addendum)
   NURSE VISIT- INJECTION  SUBJECTIVE:  Chelsea Burgess is a 25 y.o. G21P1011 female here for a Rhophylac for Rh neg status during pregnancy. She is [redacted]w[redacted]d pregnant.   OBJECTIVE:  BP 115/75 (BP Location: Right Arm, Patient Position: Sitting, Cuff Size: Normal)   Pulse 83   Wt 136 lb 3.2 oz (61.8 kg)   LMP 12/09/2019 (Exact Date)   BMI 21.33 kg/m   Appears well, in no apparent distress  Injection administered in: Right upper quad. gluteus  No orders of the defined types were placed in this encounter.   ASSESSMENT: Pregnancy [redacted]w[redacted]d Rhophylac for Rh neg status during pregnancy PLAN: Follow-up: as scheduled   Konnie Noffsinger A Mignonne Afonso  07/07/2020 3:19 PM   Chart reviewed for nurse visit. Agree with plan of care.  Jacklyn Shell, [place] 07/07/2020 4:35 PM

## 2020-07-08 LAB — URINE CULTURE

## 2020-07-12 ENCOUNTER — Other Ambulatory Visit: Payer: Self-pay | Admitting: Women's Health

## 2020-07-12 DIAGNOSIS — O2343 Unspecified infection of urinary tract in pregnancy, third trimester: Secondary | ICD-10-CM

## 2020-07-12 MED ORDER — CEPHALEXIN 500 MG PO CAPS: 500.0000 mg | ORAL_CAPSULE | Freq: Every day | ORAL | 2 refills | Status: DC

## 2020-07-12 MED ORDER — NITROFURANTOIN MONOHYD MACRO 100 MG PO CAPS: 100.0000 mg | ORAL_CAPSULE | Freq: Two times a day (BID) | ORAL | 0 refills | Status: DC

## 2020-08-01 ENCOUNTER — Telehealth: Payer: Medicaid Other | Admitting: Obstetrics & Gynecology

## 2020-08-01 ENCOUNTER — Telehealth: Payer: Medicaid Other | Admitting: Women's Health

## 2020-08-02 ENCOUNTER — Other Ambulatory Visit: Payer: Self-pay

## 2020-08-02 ENCOUNTER — Encounter: Payer: Medicaid Other | Admitting: Obstetrics & Gynecology

## 2020-08-03 NOTE — Progress Notes (Signed)
This encounter was created in error - please disregard.

## 2020-08-16 ENCOUNTER — Ambulatory Visit (INDEPENDENT_AMBULATORY_CARE_PROVIDER_SITE_OTHER): Payer: Medicaid Other | Admitting: Women's Health

## 2020-08-16 ENCOUNTER — Encounter: Payer: Self-pay | Admitting: Women's Health

## 2020-08-16 ENCOUNTER — Other Ambulatory Visit: Payer: Self-pay

## 2020-08-16 VITALS — BP 134/86 | HR 82 | Wt 140.0 lb

## 2020-08-16 DIAGNOSIS — O2343 Unspecified infection of urinary tract in pregnancy, third trimester: Secondary | ICD-10-CM

## 2020-08-16 DIAGNOSIS — Z3A35 35 weeks gestation of pregnancy: Secondary | ICD-10-CM

## 2020-08-16 DIAGNOSIS — Z3483 Encounter for supervision of other normal pregnancy, third trimester: Secondary | ICD-10-CM

## 2020-08-16 LAB — POCT URINALYSIS DIPSTICK OB
Blood, UA: NEGATIVE
Glucose, UA: NEGATIVE
Ketones, UA: NEGATIVE
Leukocytes, UA: NEGATIVE
Nitrite, UA: NEGATIVE
POC,PROTEIN,UA: NEGATIVE

## 2020-08-16 MED ORDER — ACYCLOVIR 400 MG PO TABS: 400.0000 mg | ORAL_TABLET | Freq: Three times a day (TID) | ORAL | 3 refills | Status: DC

## 2020-08-16 NOTE — Patient Instructions (Signed)
Chelsea Burgess, I greatly value your feedback.  If you receive a survey following your visit with [place] today, we appreciate you taking the time to fill it out.  Thanks, [name], CNM, WHNP-BC  [facility] ([address] [place], [place] [address]) Entrance C, located off of [address] valet parking   Go to [email or web address] to register for FREE online childbirth classes    Call the office [phone]) or go to [facility] if:  You begin to have strong, frequent contractions  Your water breaks.  Sometimes it is a big gush of fluid, sometimes it is just a trickle that keeps getting your panties wet or running down your legs  You have vaginal bleeding.  It is normal to have a small amount of spotting if your cervix was checked.   You don't feel your baby moving like normal.  If you don't, get you something to eat and drink and lay down and focus on feeling your baby move.  You should feel at least 10 movements in 2 hours.  If you don't, you should call the office or go to [facility].   Call the office [phone]) or go to [facility] hospital for these signs of pre-eclampsia:  Severe headache that does not go away with Tylenol  Visual changes- seeing spots, double, blurred vision  Pain under your right breast or upper abdomen that does not go away with Tums or heartburn medicine  Nausea and/or vomiting  Severe swelling in your hands, feet, and face    Home Blood Pressure Monitoring for Patients   Your provider has recommended that you check your blood pressure (BP) at least once a week at home. If you do not have a blood pressure cuff at home, one will be provided for you. Contact your provider if you have not received your monitor within 1 week.   Helpful Tips for Accurate Home Blood Pressure Checks  . Don't smoke, exercise, or drink caffeine 30 minutes before checking your BP . Use the restroom before checking your BP (a full  bladder can raise your pressure) . Relax in a comfortable upright chair . Feet on the ground . Left arm resting comfortably on a flat surface at the level of your heart . Legs uncrossed . Back supported . Sit quietly and don't talk . Place the cuff on your bare arm . Adjust snuggly, so that only two fingertips can fit between your skin and the top of the cuff . Check 2 readings separated by at least one minute . Keep a log of your BP readings . For a visual, please reference this diagram: http://ccnc.care/bpdiagram  Provider Name: [facility]     Phone: [phone]  Zone 1: ALL CLEAR  Continue to monitor your symptoms:  . BP reading is less than 140 (top number) or less than 90 (bottom number)  . No right upper stomach pain . No headaches or seeing spots . No feeling nauseated or throwing up . No swelling in face and hands  Zone 2: CAUTION Call your doctor's office for any of the following:  . BP reading is greater than 140 (top number) or greater than 90 (bottom number)  . Stomach pain under your ribs in the middle or right side . Headaches or seeing spots . Feeling nauseated or throwing up . Swelling in face and hands  Zone 3: EMERGENCY  Seek immediate medical care if you have any of  the following:  . BP reading is greater than160 (top number) or greater than 110 (bottom number) . Severe headaches not improving with Tylenol . Serious difficulty catching your breath . Any worsening symptoms from Zone 2  Preterm Labor and Birth Information  The normal length of a pregnancy is 39-41 weeks. Preterm labor is when labor starts before 37 completed weeks of pregnancy. What are the risk factors for preterm labor? Preterm labor is more likely to occur in women who:  Have certain infections during pregnancy such as a bladder infection, sexually transmitted infection, or infection inside the uterus (chorioamnionitis).  Have a shorter-than-normal cervix.  Have gone into  preterm labor before.  Have had surgery on their cervix.  Are younger than age 54 or older than age 25.  Are African American.  Are pregnant with twins or multiple babies (multiple gestation).  Take street drugs or smoke while pregnant.  Do not gain enough weight while pregnant.  Became pregnant shortly after having been pregnant. What are the symptoms of preterm labor? Symptoms of preterm labor include:  Cramps similar to those that can happen during a menstrual period. The cramps may happen with diarrhea.  Pain in the abdomen or lower back.  Regular uterine contractions that may feel like tightening of the abdomen.  A feeling of increased pressure in the pelvis.  Increased watery or bloody mucus discharge from the vagina.  Water breaking (ruptured amniotic sac). Why is it important to recognize signs of preterm labor? It is important to recognize signs of preterm labor because babies who are born prematurely may not be fully developed. This can put them at an increased risk for:  Long-term (chronic) heart and lung problems.  Difficulty immediately after birth with regulating body systems, including blood sugar, body temperature, heart rate, and breathing rate.  Bleeding in the brain.  Cerebral palsy.  Learning difficulties.  Death. These risks are highest for babies who are born before 48 weeks of pregnancy. How is preterm labor treated? Treatment depends on the length of your pregnancy, your condition, and the health of your baby. It may involve: 1. Having a stitch (suture) placed in your cervix to prevent your cervix from opening too early (cerclage). 2. Taking or being given medicines, such as: ? Hormone medicines. These may be given early in pregnancy to help support the pregnancy. ? Medicine to stop contractions. ? Medicines to help mature the baby's lungs. These may be prescribed if the risk of delivery is high. ? Medicines to prevent your baby from  developing cerebral palsy. If the labor happens before 34 weeks of pregnancy, you may need to stay in the hospital. What should I do if I think I am in preterm labor? If you think that you are going into preterm labor, call your health care provider right away. How can I prevent preterm labor in future pregnancies? To increase your chance of having a full-term pregnancy:  Do not use any tobacco products, such as cigarettes, chewing tobacco, and e-cigarettes. If you need help quitting, ask your health care provider.  Do not use street drugs or medicines that have not been prescribed to you during your pregnancy.  Talk with your health care provider before taking any herbal supplements, even if you have been taking them regularly.  Make sure you gain a healthy amount of weight during your pregnancy.  Watch for infection. If you think that you might have an infection, get it checked right away.  Make sure to  tell your health care provider if you have gone into preterm labor before. This information is not intended to replace advice given to you by your health care provider. Make sure you discuss any questions you have with your health care provider. Document Revised: 10/03/2018 Document Reviewed: 11/02/2015 Elsevier Patient Education  [place].

## 2020-08-16 NOTE — Progress Notes (Signed)
LOW-RISK PREGNANCY VISIT Patient name: PA TENNANT MRN [id number]  Date of birth: Feb 09, 1996 Chief Complaint:   Routine Prenatal Visit  History of Present Illness:   Chelsea Burgess is a 25 y.o. G46P1011 female at [redacted]w[redacted]d with an Estimated Date of Delivery: 09/14/20 being seen today for ongoing management of a low-risk pregnancy.  Depression screen [facility] 2/9 07/04/2020 03/14/2020 06/19/2018  Decreased Interest 0 0 0  Down, Depressed, Hopeless 0 0 0  PHQ - 2 Score 0 0 0  Altered sleeping 1 0 0  Tired, decreased energy 1 1 1   Change in appetite 0 1 0  Feeling bad or failure about yourself  0 0 0  Trouble concentrating 0 0 0  Moving slowly or fidgety/restless 0 0 0  Suicidal thoughts 0 0 0  PHQ-9 Score 2 2 1     Today she reports no complaints. Contractions: Not present. Vag. Bleeding: None.  Movement: Present. denies leaking of fluid. Review of Systems:   Pertinent items are noted in HPI Denies abnormal vaginal discharge w/ itching/odor/irritation, headaches, visual changes, shortness of breath, chest pain, abdominal pain, severe nausea/vomiting, or problems with urination or bowel movements unless otherwise stated above. Pertinent History Reviewed:  Reviewed past medical,surgical, social, obstetrical and family history.  Reviewed problem list, medications and allergies. Physical Assessment:   Vitals:   08/16/20 0905  BP: 134/86  Pulse: 82  Weight: 140 lb (63.5 kg)  Body mass index is 21.93 kg/m.        Physical Examination:   General appearance: Well appearing, and in no distress  Mental status: Alert, oriented to person, place, and time  Skin: Warm & dry  Cardiovascular: Normal heart rate noted  Respiratory: Normal respiratory effort, no distress  Abdomen: Soft, gravid, nontender  Pelvic: Cervical exam deferred         Extremities: Edema: None  Fetal Status: Fetal Heart Rate (bpm): 135 Fundal Height: 32 cm Movement: Present    Chaperone: N/A   Results for orders  placed or performed in visit on 08/16/20 (from the past 24 hour(s))  POC Urinalysis Dipstick OB   Collection Time: 08/16/20  9:11 AM  Result Value Ref Range   Color, UA     Clarity, UA     Glucose, UA Negative Negative   Bilirubin, UA     Ketones, UA neg    Spec Grav, UA     Blood, UA neg    pH, UA     POC,PROTEIN,UA Negative Negative, Trace, Small (1+), Moderate (2+), Large (3+), 4+   Urobilinogen, UA     Nitrite, UA neg    Leukocytes, UA Negative Negative   Appearance     Odor      Assessment & Plan:  1) Low-risk pregnancy G3P1011 at [redacted]w[redacted]d with an Estimated Date of Delivery: 09/14/20   2) Recurrent UTI, on keflex qhs suppression, send urine cx poc today  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 acyclovir (ZOVIRAX) 400 MG tablet    Sig: Take 1 tablet (400 mg total) by mouth 3 (three) times daily.    Dispense:  90 tablet    Refill:  3    Order Specific Question:   Supervising Provider    Answer:   [redacted]w[redacted]d [2510]   Labs/procedures today: urine culture  Plan:  Continue routine obstetrical care  Next visit: prefers will be in person for gbs    Reviewed: Preterm labor symptoms and general obstetric precautions including but not limited to vaginal bleeding, contractions, leaking of fluid and fetal movement were reviewed in detail with the patient.  All questions were answered. Has home bp cuff. Check bp daily, let us know if >140/90.   Follow-up: Return in about 1 week (around 08/23/2020) for LROB, CNM, in person; ASAP EFW u/s (check w/ MFM if needed).  No future appointments.  Orders Placed This Encounter  Procedures  . Urine Culture  . POC Urinalysis Dipstick OB   Cheral Marker CNM, [facility] 08/16/2020 9:43 AM

## 2020-08-18 LAB — URINE CULTURE

## 2020-08-24 ENCOUNTER — Other Ambulatory Visit (HOSPITAL_COMMUNITY)
Admission: RE | Admit: 2020-08-24 | Discharge: 2020-08-24 | Disposition: A | Discharge status: Home / Self Care | Payer: Medicaid Other | Source: Ambulatory Visit | Attending: Advanced Practice Midwife | Admitting: Advanced Practice Midwife

## 2020-08-24 ENCOUNTER — Other Ambulatory Visit: Payer: Self-pay

## 2020-08-24 ENCOUNTER — Other Ambulatory Visit: Payer: Self-pay | Admitting: Women's Health

## 2020-08-24 ENCOUNTER — Ambulatory Visit (INDEPENDENT_AMBULATORY_CARE_PROVIDER_SITE_OTHER): Payer: Medicaid Other | Admitting: Advanced Practice Midwife

## 2020-08-24 VITALS — BP 114/73 | HR 89 | Wt 140.0 lb

## 2020-08-24 DIAGNOSIS — Z3A37 37 weeks gestation of pregnancy: Secondary | ICD-10-CM | POA: Diagnosis present

## 2020-08-24 DIAGNOSIS — Z3480 Encounter for supervision of other normal pregnancy, unspecified trimester: Secondary | ICD-10-CM

## 2020-08-24 DIAGNOSIS — Z23 Encounter for immunization: Secondary | ICD-10-CM | POA: Diagnosis not present

## 2020-08-24 DIAGNOSIS — Z3483 Encounter for supervision of other normal pregnancy, third trimester: Secondary | ICD-10-CM | POA: Diagnosis not present

## 2020-08-24 DIAGNOSIS — O26843 Uterine size-date discrepancy, third trimester: Secondary | ICD-10-CM

## 2020-08-24 MED ORDER — AMOXICILLIN-POT CLAVULANATE 875-125 MG PO TABS: 1.0000 | ORAL_TABLET | Freq: Two times a day (BID) | ORAL | 0 refills | Status: DC

## 2020-08-24 NOTE — Addendum Note (Signed)
Addended by: Cheral Marker on: 08/24/2020 01:15 PM   Modules accepted: Orders

## 2020-08-24 NOTE — Patient Instructions (Signed)

## 2020-08-24 NOTE — Progress Notes (Signed)
   LOW-RISK PREGNANCY VISIT Patient name: Chelsea Burgess MRN [id number]  Date of birth: 28-Sep-1995 Chief Complaint:   Routine Prenatal Visit  History of Present Illness:   Chelsea Burgess is a 25 y.o. G68P1011 female at [redacted]w[redacted]d with an Estimated Date of Delivery: 09/14/20 being seen today for ongoing management of a low-risk pregnancy.  Today she reports having some pelvic pressure; dx with UTI on 2/22 but abx called in today. Contractions: Not present. Vag. Bleeding: None.  Movement: Present. denies leaking of fluid. Review of Systems:   Pertinent items are noted in HPI Denies abnormal vaginal discharge w/ itching/odor/irritation, headaches, visual changes, shortness of breath, chest pain, abdominal pain, severe nausea/vomiting, or problems with urination or bowel movements unless otherwise stated above. Pertinent History Reviewed:  Reviewed past medical,surgical, social, obstetrical and family history.  Reviewed problem list, medications and allergies. Physical Assessment:   Vitals:   08/24/20 1441  BP: 114/73  Pulse: 89  Weight: 140 lb (63.5 kg)  Body mass index is 21.93 kg/m.        Physical Examination:   General appearance: Well appearing, and in no distress  Mental status: Alert, oriented to person, place, and time  Skin: Warm & dry  Cardiovascular: Normal heart rate noted  Respiratory: Normal respiratory effort, no distress  Abdomen: Soft, gravid, nontender  Pelvic: Cervical exam deferred         Extremities: Edema: None  Fetal Status: Fetal Heart Rate (bpm): 129 Fundal Height: 34 cm Movement: Present    No results found for this or any previous visit (from the past 24 hour(s)).  Assessment & Plan:  1) Low-risk pregnancy G3P1011 at [redacted]w[redacted]d with an Estimated Date of Delivery: 09/14/20   2) Recurrent UTIs with recent UTI 08/16/20, abx called in earlier today>plan for TOC next week  3) Hx HSV, taking acyclovir suppression   4) S<D, EFW already scheduled for tomorrow    Meds: No orders of the defined types were placed in this encounter.  Labs/procedures today: Tdap  Plan:  Continue routine obstetrical care   Reviewed: Term labor symptoms and general obstetric precautions including but not limited to vaginal bleeding, contractions, leaking of fluid and fetal movement were reviewed in detail with the patient.  All questions were answered. Has home bp cuff. Check bp weekly, let us know if >140/90.   Follow-up: Return in about 1 week (around 08/31/2020) for LROB, in person.  Orders Placed This Encounter  Procedures  . Culture, beta strep (group b only)  . Tdap vaccine greater than or equal to 7yo IM   Arabella Merles [facility] 08/24/2020 3:19 PM

## 2020-08-25 ENCOUNTER — Ambulatory Visit (INDEPENDENT_AMBULATORY_CARE_PROVIDER_SITE_OTHER): Payer: Medicaid Other

## 2020-08-25 DIAGNOSIS — O26843 Uterine size-date discrepancy, third trimester: Secondary | ICD-10-CM

## 2020-08-25 DIAGNOSIS — Z3A37 37 weeks gestation of pregnancy: Secondary | ICD-10-CM

## 2020-08-25 DIAGNOSIS — Z6791 Unspecified blood type, Rh negative: Secondary | ICD-10-CM

## 2020-08-25 DIAGNOSIS — O26892 Other specified pregnancy related conditions, second trimester: Secondary | ICD-10-CM

## 2020-08-25 DIAGNOSIS — Z3480 Encounter for supervision of other normal pregnancy, unspecified trimester: Secondary | ICD-10-CM

## 2020-08-25 NOTE — Progress Notes (Signed)
[place] 37+1 wks,cephalic,FHT 130 BPM,AFI 12.9 cm,left lateral placenta gr 2,EFW 2794 g 26%,LVEICF 2.3 mm

## 2020-08-26 LAB — CERVICOVAGINAL ANCILLARY ONLY
Chlamydia: NEGATIVE
Comment: NEGATIVE
Comment: NORMAL
Neisseria Gonorrhea: NEGATIVE

## 2020-08-28 LAB — CULTURE, BETA STREP (GROUP B ONLY): Strep Gp B Culture: NEGATIVE

## 2020-09-01 ENCOUNTER — Ambulatory Visit (INDEPENDENT_AMBULATORY_CARE_PROVIDER_SITE_OTHER): Payer: Medicaid Other | Admitting: Obstetrics & Gynecology

## 2020-09-01 ENCOUNTER — Encounter: Payer: Medicaid Other | Admitting: Obstetrics & Gynecology

## 2020-09-01 ENCOUNTER — Other Ambulatory Visit: Payer: Self-pay

## 2020-09-01 VITALS — BP 115/81 | HR 78 | Wt 142.0 lb

## 2020-09-01 DIAGNOSIS — Z3483 Encounter for supervision of other normal pregnancy, third trimester: Secondary | ICD-10-CM

## 2020-09-01 NOTE — Progress Notes (Signed)
   LOW-RISK PREGNANCY VISIT Patient name: Chelsea Burgess MRN [id number]  Date of birth: 11/28/1995 Chief Complaint:   Routine Prenatal Visit  History of Present Illness:   Chelsea Burgess is a 25 y.o. G33P1011 female at [redacted]w[redacted]d with an Estimated Date of Delivery: 09/14/20 being seen today for ongoing management of a low-risk pregnancy.  Depression screen [facility] 2/9 07/04/2020 03/14/2020 06/19/2018  Decreased Interest 0 0 0  Down, Depressed, Hopeless 0 0 0  PHQ - 2 Score 0 0 0  Altered sleeping 1 0 0  Tired, decreased energy 1 1 1   Change in appetite 0 1 0  Feeling bad or failure about yourself  0 0 0  Trouble concentrating 0 0 0  Moving slowly or fidgety/restless 0 0 0  Suicidal thoughts 0 0 0  PHQ-9 Score 2 2 1     Today she reports no complaints. Contractions: Irritability. Vag. Bleeding: None.  Movement: Present. denies leaking of fluid. Review of Systems:   Pertinent items are noted in HPI Denies abnormal vaginal discharge w/ itching/odor/irritation, headaches, visual changes, shortness of breath, chest pain, abdominal pain, severe nausea/vomiting, or problems with urination or bowel movements unless otherwise stated above. Pertinent History Reviewed:  Reviewed past medical,surgical, social, obstetrical and family history.  Reviewed problem list, medications and allergies. Physical Assessment:   Vitals:   09/01/20 1108  BP: 115/81  Pulse: 78  Weight: 142 lb (64.4 kg)  Body mass index is 22.24 kg/m.        Physical Examination:   General appearance: Well appearing, and in no distress  Mental status: Alert, oriented to person, place, and time  Skin: Warm & dry  Cardiovascular: Normal heart rate noted  Respiratory: Normal respiratory effort, no distress  Abdomen: Soft, gravid, nontender  Pelvic: Cervical exam deferred         Extremities: Edema: None  Fetal Status:     Movement: Present    Chaperone: n/a    No results found for this or any previous visit (from the  past 24 hour(s)).  Assessment & Plan:  1) Low-risk pregnancy G3P1011 at [redacted]w[redacted]d with an Estimated Date of Delivery: 09/14/20   2) recurrent UTI on keflex,    Meds: No orders of the defined types were placed in this encounter.  Labs/procedures today:   Plan:  Continue routine obstetrical care  Next visit: prefers in person    Reviewed: Term labor symptoms and general obstetric precautions including but not limited to vaginal bleeding, contractions, leaking of fluid and fetal movement were reviewed in detail with the patient.  All questions were answered. Has home bp cuff. Rx faxed to . Check bp weekly, let [redacted]w[redacted]d know if >140/90.   Follow-up: Return in about 1 week (around 09/08/2020) for LROB.  No orders of the defined types were placed in this encounter.   [place], MD 09/01/2020 11:47 AM

## 2020-09-08 ENCOUNTER — Ambulatory Visit (INDEPENDENT_AMBULATORY_CARE_PROVIDER_SITE_OTHER): Payer: Medicaid Other | Admitting: Advanced Practice Midwife

## 2020-09-08 ENCOUNTER — Other Ambulatory Visit: Payer: Self-pay

## 2020-09-08 VITALS — BP 125/79 | HR 71 | Wt 143.5 lb

## 2020-09-08 DIAGNOSIS — O26843 Uterine size-date discrepancy, third trimester: Secondary | ICD-10-CM

## 2020-09-08 DIAGNOSIS — Z3A39 39 weeks gestation of pregnancy: Secondary | ICD-10-CM

## 2020-09-08 DIAGNOSIS — Z3480 Encounter for supervision of other normal pregnancy, unspecified trimester: Secondary | ICD-10-CM

## 2020-09-08 NOTE — Progress Notes (Signed)
   LOW-RISK PREGNANCY VISIT Patient name: Chelsea Burgess MRN [id number]  Date of birth: 09-Jul-1995 Chief Complaint:   Routine Prenatal Visit  History of Present Illness:   Chelsea Burgess is a 25 y.o. G40P1011 female at [redacted]w[redacted]d with an Estimated Date of Delivery: 09/14/20 being seen today for ongoing management of a low-risk pregnancy.  Today she reports no complaints. Contractions: Not present. Vag. Bleeding: None.  Movement: Present. denies leaking of fluid. Review of Systems:   Pertinent items are noted in HPI Denies abnormal vaginal discharge w/ itching/odor/irritation, headaches, visual changes, shortness of breath, chest pain, abdominal pain, severe nausea/vomiting, or problems with urination or bowel movements unless otherwise stated above. Pertinent History Reviewed:  Reviewed past medical,surgical, social, obstetrical and family history.  Reviewed problem list, medications and allergies. Physical Assessment:   Vitals:   09/08/20 1057  BP: 125/79  Pulse: 71  Weight: 143 lb 8 oz (65.1 kg)  Body mass index is 22.48 kg/m.        Physical Examination:   General appearance: Well appearing, and in no distress  Mental status: Alert, oriented to person, place, and time  Skin: Warm & dry  Cardiovascular: Normal heart rate noted  Respiratory: Normal respiratory effort, no distress  Abdomen: Soft, gravid, nontender  Pelvic: Cervical exam deferred         Extremities: Edema: None  Fetal Status:   Fundal Height: 35 cm Movement: Present    Chaperone: n/a    No results found for this or any previous visit (from the past 24 hour(s)).  Assessment & Plan:  1) Low-risk pregnancy G3P1011 at [redacted]w[redacted]d with an Estimated Date of Delivery: 09/14/20     Meds: No orders of the defined types were placed in this encounter.  Labs/procedures today: none  Plan:  Continue routine obstetrical care  Next visit: prefers in person    Reviewed: Term labor symptoms and general obstetric precautions  including but not limited to vaginal bleeding, contractions, leaking of fluid and fetal movement were reviewed in detail with the patient.  All questions were answered. Has home bp cuff. Check bp weekly, let us know if >140/90.   Follow-up: Return in about 1 week (around 09/15/2020) for LROB and BPP(if not available, do NST).  No orders of the defined types were placed in this encounter.  Jacklyn Shell DNP, CNM 09/08/2020 11:14 AM

## 2020-09-08 NOTE — Patient Instructions (Signed)

## 2020-09-15 ENCOUNTER — Other Ambulatory Visit (HOSPITAL_COMMUNITY): Payer: Self-pay | Admitting: Advanced Practice Midwife

## 2020-09-15 ENCOUNTER — Other Ambulatory Visit: Payer: Self-pay

## 2020-09-15 ENCOUNTER — Ambulatory Visit (INDEPENDENT_AMBULATORY_CARE_PROVIDER_SITE_OTHER): Payer: Medicaid Other | Admitting: Obstetrics & Gynecology

## 2020-09-15 ENCOUNTER — Encounter: Payer: Self-pay | Admitting: Advanced Practice Midwife

## 2020-09-15 ENCOUNTER — Encounter: Payer: Self-pay | Admitting: Obstetrics & Gynecology

## 2020-09-15 VITALS — BP 111/73 | HR 75 | Wt 144.5 lb

## 2020-09-15 DIAGNOSIS — Z3480 Encounter for supervision of other normal pregnancy, unspecified trimester: Secondary | ICD-10-CM

## 2020-09-15 DIAGNOSIS — O480 Post-term pregnancy: Secondary | ICD-10-CM | POA: Diagnosis not present

## 2020-09-15 NOTE — Progress Notes (Signed)
   LOW-RISK PREGNANCY VISIT Patient name: Chelsea Burgess MRN [id number]  Date of birth: 07-03-1995 Chief Complaint:   Routine Prenatal Visit (NST) and Non-stress Test  History of Present Illness:   Chelsea Burgess is a 25 y.o. G37P1011 female at [redacted]w[redacted]d with an Estimated Date of Delivery: 09/14/20 being seen today for ongoing management of a low-risk pregnancy.  Depression screen [facility] 2/9 07/04/2020 03/14/2020 06/19/2018  Decreased Interest 0 0 0  Down, Depressed, Hopeless 0 0 0  PHQ - 2 Score 0 0 0  Altered sleeping 1 0 0  Tired, decreased energy 1 1 1   Change in appetite 0 1 0  Feeling bad or failure about yourself  0 0 0  Trouble concentrating 0 0 0  Moving slowly or fidgety/restless 0 0 0  Suicidal thoughts 0 0 0  PHQ-9 Score 2 2 1     Today she reports no complaints. Contractions: Irregular. Vag. Bleeding: None.  Movement: Present. denies leaking of fluid. Review of Systems:   Pertinent items are noted in HPI Denies abnormal vaginal discharge w/ itching/odor/irritation, headaches, visual changes, shortness of breath, chest pain, abdominal pain, severe nausea/vomiting, or problems with urination or bowel movements unless otherwise stated above. Pertinent History Reviewed:  Reviewed past medical,surgical, social, obstetrical and family history.  Reviewed problem list, medications and allergies. Physical Assessment:   Vitals:   09/15/20 1120  BP: 111/73  Pulse: 75  Weight: 144 lb 8 oz (65.5 kg)  Body mass index is 22.63 kg/m.        Physical Examination:   General appearance: Well appearing, and in no distress  Mental status: Alert, oriented to person, place, and time  Skin: Warm & dry  Cardiovascular: Normal heart rate noted  Respiratory: Normal respiratory effort, no distress  Abdomen: Soft, gravid, nontender  Pelvic: Cervical exam performed         Extremities: Edema: None  Fetal Status:     Movement: Present    Chaperone: 09/17/20   No results found for this  or any previous visit (from the past 24 hour(s)).  Assessment & Plan:  1) Low-risk pregnancy G3P1011 at [redacted]w[redacted]d with an Estimated Date of Delivery: 09/14/20   2) reactive NST, pending post dates, IOL 1 weeks   Meds: No orders of the defined types were placed in this encounter.  Labs/procedures today: NST   TENIQUA MARRON is at [redacted]w[redacted]d Estimated Date of Delivery: 09/14/20  NST being performed due to pending post dates  Today the NST is Reactive  Fetal Monitoring:  Baseline: 130 bpm, Variability: Good {> 6 bpm), Accelerations: Reactive and Decelerations: Absent   reactive  The accelerations are >15 bpm and more than 2 in 20 minutes  Final diagnosis:  Reactive NST  [redacted]w[redacted]d, MD     Plan:  Continue routine obstetrical care  Next visit: prefers in person    Reviewed: Term labor symptoms and general obstetric precautions including but not limited to vaginal bleeding, contractions, leaking of fluid and fetal movement were reviewed in detail with the patient.  All questions were answered.  home bp cuff. Rx faxed to . Check bp weekly, let 09/16/20 know if >140/90.   Follow-up: No follow-ups on file.  No future appointments.  No orders of the defined types were placed in this encounter.  Lazaro Arms CNM, [facility] 09/15/2020 12:09 PM

## 2020-09-16 ENCOUNTER — Telehealth (HOSPITAL_COMMUNITY): Payer: Self-pay | Admitting: *Deleted

## 2020-09-16 NOTE — Telephone Encounter (Signed)
Preadmission screen  

## 2020-09-19 ENCOUNTER — Telehealth (HOSPITAL_COMMUNITY): Payer: Self-pay | Admitting: *Deleted

## 2020-09-19 ENCOUNTER — Other Ambulatory Visit (HOSPITAL_COMMUNITY)
Admission: RE | Admit: 2020-09-19 | Discharge: 2020-09-19 | Disposition: A | Discharge status: Home / Self Care | Payer: Medicaid Other | Source: Ambulatory Visit | Attending: Family Medicine | Admitting: Family Medicine

## 2020-09-19 DIAGNOSIS — Z20822 Contact with and (suspected) exposure to covid-19: Secondary | ICD-10-CM | POA: Diagnosis not present

## 2020-09-19 DIAGNOSIS — Z01812 Encounter for preprocedural laboratory examination: Secondary | ICD-10-CM | POA: Insufficient documentation

## 2020-09-19 NOTE — Telephone Encounter (Signed)
Preadmission screen  

## 2020-09-20 ENCOUNTER — Other Ambulatory Visit: Payer: Self-pay | Admitting: Advanced Practice Midwife

## 2020-09-20 LAB — SARS CORONAVIRUS 2 (TAT 6-24 HRS): SARS Coronavirus 2: NEGATIVE

## 2020-09-21 ENCOUNTER — Inpatient Hospital Stay (HOSPITAL_COMMUNITY): Payer: Medicaid Other

## 2020-09-21 ENCOUNTER — Encounter (HOSPITAL_COMMUNITY): Payer: Self-pay | Admitting: Obstetrics and Gynecology

## 2020-09-21 ENCOUNTER — Inpatient Hospital Stay (HOSPITAL_COMMUNITY)
Admission: AD | Admit: 2020-09-21 | Discharge: 2020-09-23 | DRG: 806 | Disposition: A | Discharge status: Home / Self Care | Payer: Medicaid Other | Attending: Obstetrics and Gynecology | Admitting: Obstetrics and Gynecology

## 2020-09-21 ENCOUNTER — Inpatient Hospital Stay (HOSPITAL_COMMUNITY): Payer: Medicaid Other | Admitting: Anesthesiology

## 2020-09-21 ENCOUNTER — Other Ambulatory Visit: Payer: Self-pay

## 2020-09-21 DIAGNOSIS — Z3480 Encounter for supervision of other normal pregnancy, unspecified trimester: Secondary | ICD-10-CM

## 2020-09-21 DIAGNOSIS — O26843 Uterine size-date discrepancy, third trimester: Principal | ICD-10-CM

## 2020-09-21 DIAGNOSIS — Z3A41 41 weeks gestation of pregnancy: Secondary | ICD-10-CM

## 2020-09-21 DIAGNOSIS — A6000 Herpesviral infection of urogenital system, unspecified: Secondary | ICD-10-CM | POA: Diagnosis present

## 2020-09-21 DIAGNOSIS — Z87440 Personal history of urinary (tract) infections: Secondary | ICD-10-CM

## 2020-09-21 DIAGNOSIS — O2340 Unspecified infection of urinary tract in pregnancy, unspecified trimester: Secondary | ICD-10-CM | POA: Diagnosis present

## 2020-09-21 DIAGNOSIS — O99324 Drug use complicating childbirth: Secondary | ICD-10-CM | POA: Diagnosis present

## 2020-09-21 DIAGNOSIS — Z6791 Unspecified blood type, Rh negative: Secondary | ICD-10-CM

## 2020-09-21 DIAGNOSIS — O26893 Other specified pregnancy related conditions, third trimester: Secondary | ICD-10-CM | POA: Diagnosis present

## 2020-09-21 DIAGNOSIS — Z8619 Personal history of other infectious and parasitic diseases: Secondary | ICD-10-CM | POA: Diagnosis present

## 2020-09-21 DIAGNOSIS — F1290 Cannabis use, unspecified, uncomplicated: Secondary | ICD-10-CM | POA: Diagnosis present

## 2020-09-21 DIAGNOSIS — O26892 Other specified pregnancy related conditions, second trimester: Secondary | ICD-10-CM

## 2020-09-21 DIAGNOSIS — O480 Post-term pregnancy: Secondary | ICD-10-CM | POA: Diagnosis present

## 2020-09-21 DIAGNOSIS — O26899 Other specified pregnancy related conditions, unspecified trimester: Secondary | ICD-10-CM

## 2020-09-21 DIAGNOSIS — O9832 Other infections with a predominantly sexual mode of transmission complicating childbirth: Secondary | ICD-10-CM | POA: Diagnosis present

## 2020-09-21 DIAGNOSIS — Z30430 Encounter for insertion of intrauterine contraceptive device: Secondary | ICD-10-CM | POA: Diagnosis not present

## 2020-09-21 DIAGNOSIS — Z975 Presence of (intrauterine) contraceptive device: Secondary | ICD-10-CM

## 2020-09-21 LAB — TYPE AND SCREEN
ABO/RH(D): O NEG
Antibody Screen: POSITIVE

## 2020-09-21 LAB — CBC
HCT: 38.5 % (ref 36.0–46.0)
Hemoglobin: 12.1 g/dL (ref 12.0–15.0)
MCH: 25.9 pg — ABNORMAL LOW (ref 26.0–34.0)
MCHC: 31.4 g/dL (ref 30.0–36.0)
MCV: 82.4 fL (ref 80.0–100.0)
Platelets: 206 10*3/uL (ref 150–400)
RBC: 4.67 MIL/uL (ref 3.87–5.11)
RDW: 15.6 % — ABNORMAL HIGH (ref 11.5–15.5)
WBC: 6.7 10*3/uL (ref 4.0–10.5)
nRBC: 0 % (ref 0.0–0.2)

## 2020-09-21 MED ORDER — OXYTOCIN-SODIUM CHLORIDE 30-0.9 UT/500ML-% IV SOLN
2.5000 [IU]/h | INTRAVENOUS | Status: DC
  Administered 2020-09-21: 2.5 [IU]/h via INTRAVENOUS
  Filled 2020-09-21: qty 500

## 2020-09-21 MED ORDER — LACTATED RINGERS IV SOLN: 500.0000 mL | INTRAVENOUS | Status: DC | PRN

## 2020-09-21 MED ORDER — PHENYLEPHRINE 40 MCG/ML (10ML) SYRINGE FOR IV PUSH (FOR BLOOD PRESSURE SUPPORT): 80.0000 ug | PREFILLED_SYRINGE | INTRAVENOUS | Status: DC | PRN

## 2020-09-21 MED ORDER — MISOPROSTOL 50MCG HALF TABLET
50.0000 ug | ORAL_TABLET | ORAL | Status: DC | PRN
  Administered 2020-09-21: 50 ug via BUCCAL
  Filled 2020-09-21: qty 1

## 2020-09-21 MED ORDER — OXYTOCIN-SODIUM CHLORIDE 30-0.9 UT/500ML-% IV SOLN
1.0000 m[IU]/min | INTRAVENOUS | Status: DC
  Administered 2020-09-21: 2 m[IU]/min via INTRAVENOUS

## 2020-09-21 MED ORDER — TERBUTALINE SULFATE 1 MG/ML IJ SOLN: 0.2500 mg | Freq: Once | INTRAMUSCULAR | Status: DC | PRN

## 2020-09-21 MED ORDER — FENTANYL CITRATE (PF) 100 MCG/2ML IJ SOLN
100.0000 ug | INTRAMUSCULAR | Status: DC | PRN
  Administered 2020-09-21 (×2): 100 ug via INTRAVENOUS
  Filled 2020-09-21 (×2): qty 2

## 2020-09-21 MED ORDER — OXYTOCIN BOLUS FROM INFUSION
333.0000 mL | Freq: Once | INTRAVENOUS | Status: AC
  Administered 2020-09-21: 333 mL via INTRAVENOUS

## 2020-09-21 MED ORDER — EPHEDRINE 5 MG/ML INJ: 10.0000 mg | INTRAVENOUS | Status: DC | PRN

## 2020-09-21 MED ORDER — OXYCODONE-ACETAMINOPHEN 5-325 MG PO TABS: 2.0000 | ORAL_TABLET | ORAL | Status: DC | PRN

## 2020-09-21 MED ORDER — LACTATED RINGERS IV SOLN: INTRAVENOUS | Status: DC

## 2020-09-21 MED ORDER — LIDOCAINE HCL (PF) 1 % IJ SOLN: 30.0000 mL | INTRAMUSCULAR | Status: DC | PRN

## 2020-09-21 MED ORDER — SOD CITRATE-CITRIC ACID 500-334 MG/5ML PO SOLN: 30.0000 mL | ORAL | Status: DC | PRN

## 2020-09-21 MED ORDER — FENTANYL-BUPIVACAINE-NACL 0.5-0.125-0.9 MG/250ML-% EP SOLN
12.0000 mL/h | EPIDURAL | Status: DC | PRN
Start: 2020-09-21 — End: 2020-09-22
  Administered 2020-09-21: 12 mL/h via EPIDURAL
  Filled 2020-09-21: qty 250

## 2020-09-21 MED ORDER — LIDOCAINE HCL (PF) 1 % IJ SOLN
INTRAMUSCULAR | Status: DC | PRN
  Administered 2020-09-21: 10 mL via EPIDURAL

## 2020-09-21 MED ORDER — LACTATED RINGERS IV SOLN: 500.0000 mL | Freq: Once | INTRAVENOUS | Status: DC

## 2020-09-21 MED ORDER — LEVONORGESTREL 19.5 MCG/DAY IU IUD
INTRAUTERINE_SYSTEM | Freq: Once | INTRAUTERINE | Status: AC
  Administered 2020-09-21: 1 via INTRAUTERINE
  Filled 2020-09-21: qty 1

## 2020-09-21 MED ORDER — ACETAMINOPHEN 325 MG PO TABS: 650.0000 mg | ORAL_TABLET | ORAL | Status: DC | PRN

## 2020-09-21 MED ORDER — OXYCODONE-ACETAMINOPHEN 5-325 MG PO TABS: 1.0000 | ORAL_TABLET | ORAL | Status: DC | PRN

## 2020-09-21 MED ORDER — DIPHENHYDRAMINE HCL 50 MG/ML IJ SOLN: 12.5000 mg | INTRAMUSCULAR | Status: DC | PRN

## 2020-09-21 MED ORDER — ONDANSETRON HCL 4 MG/2ML IJ SOLN: 4.0000 mg | Freq: Four times a day (QID) | INTRAMUSCULAR | Status: DC | PRN

## 2020-09-21 NOTE — Procedures (Signed)
POST-PLACENTAL IUD INSERTION PROCEDURE NOTE Patient name: Chelsea Burgess MRN [id number]  Date of birth: 10-13-1995  The risks and benefits of the method and placement have been thouroughly reviewed with the patient and all questions were answered.  Specifically the patient is aware of failure rate of 06/998, expulsion of the IUD and of possible perforation.  The patient is aware of irregular bleeding due to the method and understands the incidence of irregular bleeding diminishes with time.   Signed copy of informed consent in chart.   Vaginal, labial and perineal areas thoroughly inspected for lacerations. Lacerations: none laceration identified and if needed was repaired prior to IUD insertion  Pt's IUD choice: Liletta  Time out was performed.    IUD removed from insertion device and grasped between sterile gloved fingers. Fundus identified through abdominal wall using non-insertion hand. IUD inserted to fundus with bimanual technique. IUD carefully released at the fundus and insertion hand gently removed from vagina.    Strings trimmed to the level of the introitus. Patient tolerated procedure well.  Patient given post procedure instructions and IUD care card with expiration date.  Patient is asked to keep IUD strings tucked in her vagina until her postpartum follow up visit in 4-6 weeks. Patient advised to abstain from sexual intercourse and pulling on strings before her follow-up visit. Patient verbalized an understanding of the plan of care and agrees.   Pt added to the post-placental IUD insertion list and charges entered.  Lot# [id number] Exp: 09/2023  Arabella Merles CNM 09/21/2020 11:19 PM

## 2020-09-21 NOTE — Progress Notes (Signed)
LABOR PROGRESS NOTE  Chelsea Burgess is a 25 y.o. G3P1011 at [redacted]w[redacted]d  admitted for IOL for post dates.   Subjective: Doing well at this time. Feeling more contractions.   Objective: BP 115/72   Pulse 72   Temp 98 F (36.7 C) (Oral)   Resp 16   Ht 5\' 7"  (1.702 m)   Wt 66.9 kg   LMP 12/09/2019 (Exact Date)   BMI 23.10 kg/m  or  Vitals:   09/21/20 1103 09/21/20 1104 09/21/20 1203 09/21/20 1353  BP:  130/87 115/72 115/72  Pulse:  68 90 72  Resp: 16     Temp:   98 F (36.7 C)   TempSrc:   Oral   Weight:      Height:       Dilation: 1 Effacement (%): 50 Station: -2 Presentation: Vertex Exam by:: Dr. [email or web address] FHT: baseline rate 130, moderate varibility, + acel, no decel Toco: Q 2-4   Labs: Lab Results  Component Value Date   WBC 6.7 09/21/2020   HGB 12.1 09/21/2020   HCT 38.5 09/21/2020   MCV 82.4 09/21/2020   PLT 206 09/21/2020    Patient Active Problem List   Diagnosis Date Noted  . Post term pregnancy over 40 weeks 09/21/2020  . Uterine size-date discrepancy in third trimester 09/08/2020  . History of herpes simplex infection 05/25/2020  . Recurrent UTI (urinary tract infection) complicating pregnancy 03/21/2020  . Chlamydia 03/21/2020  . Marijuana use 03/16/2020  . Rh negative status during pregnancy 03/14/2020  . Encounter for supervision of normal pregnancy, antepartum 03/08/2020  . Abnormal Pap smear of cervix 06/27/2018    Assessment / Plan: 25 y.o. G3P1011 at [redacted]w[redacted]d here for IOL for post dates.   Labor: Progressing slightly.  S/p Cytotec x1.  Discussed Foley balloon placement after this check but patient desires other options.  We will initiate low-dose Pitocin and patient is agreeable to Foley bulb if no progression after next check.  Will recheck cervix in 4 hours. Fetal Wellbeing: Category 1, reassuring Pain Control: IV pain medicine and epidural as requested Anticipated MOD: Vaginal Hx of HSV: Patient on Valtrex with no recent outbreaks.  No  prodromal symptoms.  No visualized lesions on speculum exam. Rh- status: S/p RhoGam in pregnancy.  RhoGam work-up in the postpartum period. Marijuana use in pregnancy: Plan for social work consult when postpartum.   [redacted]w[redacted]d, MD  PGY-2, [facility]  09/21/2020, 1:58 PM

## 2020-09-21 NOTE — Discharge Instructions (Signed)

## 2020-09-21 NOTE — H&P (Signed)
OBSTETRIC ADMISSION HISTORY AND PHYSICAL  Chelsea Burgess is a 25 y.o. female G49P1011 with IUP at [redacted]w[redacted]d by L/10 presenting for post-dates IOL. She reports +FMs, No LOF, no VB, no blurry vision, headaches or peripheral edema, and RUQ pain.  She plans on breast feeding. She requests post-placental Liletta IUD for birth control.  She received her prenatal care at [facility]   Dating: By L/10 --->  Estimated Date of Delivery: 09/14/20  Sono:  @[redacted]w[redacted]d , CWD, normal anatomy, cephalic presentation, 2794g, EFW  Prenatal History/Complications:  - HSV on suppressive valtrex, no recent outbreaks) - Recurrent UTIs (Keflex in pregnancy) - Rh negative status (rhogam in pregnancy) - marijuana use in pregnancy - Chlamydia in pregnancy (negative TOC)  Past Medical History: Past Medical History:  Diagnosis Date  . Herpes   . Medical history non-contributory     Past Surgical History: Past Surgical History:  Procedure Laterality Date  . NO PAST SURGERIES      Obstetrical History: OB History    Gravida  3   Para  1   Term  1   Preterm  0   AB  1   Living  1     SAB  1   IAB  0   Ectopic  0   Multiple      Live Births  1           Social History Social History   Socioeconomic History  . Marital status: Single    Spouse name: Not on file  . Number of children: 1  . Years of education: Not on file  . Highest education level: Not on file  Occupational History  . Not on file  Tobacco Use  . Smoking status: Never Smoker  . Smokeless tobacco: Never Used  Vaping Use  . Vaping Use: Never used  Substance and Sexual Activity  . Alcohol use: Not Currently  . Drug use: Never  . Sexual activity: Yes    Birth control/protection: None  Other Topics Concern  . Not on file  Social History Narrative   ** Merged History Encounter **       ** Merged History Encounter **       Social Determinants of Health   Financial Resource Strain: Low Risk   . Difficulty of  Paying Living Expenses: Not very hard  Food Insecurity: No Food Insecurity  . Worried About 42% in the Last Year: Never true  . Ran Out of Food in the Last Year: Never true  Transportation Needs: No Transportation Needs  . Lack of Transportation (Medical): No  . Lack of Transportation (Non-Medical): No  Physical Activity: Insufficiently Active  . Days of Exercise per Week: 1 day  . Minutes of Exercise per Session: 10 min  Stress: No Stress Concern Present  . Feeling of Stress : Not at all  Social Connections: Moderately Integrated  . Frequency of Communication with Friends and Family: More than three times a week  . Frequency of Social Gatherings with Friends and Family: Twice a week  . Attends Religious Services: More than 4 times per year  . Active Member of Clubs or Organizations: No  . Attends Programme researcher, broadcasting/film/video Meetings: Never  . Marital Status: Living with partner    Family History: Family History  Problem Relation Age of Onset  . Stroke Mother   . Heart attack Mother   . Heart attack Father   . Stroke Maternal Aunt   .  Diabetes Maternal Grandmother   . Cancer Maternal Grandfather        colon    Allergies: No Known Allergies  Medications Prior to Admission  Medication Sig Dispense Refill Last Dose  . acyclovir (ZOVIRAX) 400 MG tablet Take 1 tablet (400 mg total) by mouth 3 (three) times daily. 90 tablet 3   . amoxicillin-clavulanate (AUGMENTIN) 875-125 MG tablet Take 1 tablet by mouth 2 (two) times daily. 14 tablet 0   . Prenatal MV-Min-FA-Omega-3 (PRENATAL GUMMIES/DHA & FA PO) Take by mouth.        Review of Systems   All systems reviewed and negative except as stated in HPI  Blood pressure 119/86, pulse 80, temperature 98.2 F (36.8 C), temperature source Oral, resp. rate 18, height 5\' 7"  (1.702 m), weight 66.9 kg, last menstrual period 12/09/2019, not currently breastfeeding. General appearance: alert, cooperative and appears stated  age Lungs: normal WOB Heart: regular rate Abdomen: soft, non-tender Extremities: no sign of DVT Presentation: cephalic Fetal monitoringBaseline: 120 bpm, Variability: Good {> 6 bpm), Accelerations: Reactive and Decelerations: Absent Uterine activity Frequency: irregular  Dilation: 1 Effacement (%): Thick Station: -3 Exam by:: Dr. [email or web address]   Prenatal labs: ABO, Rh: --/--/PENDING (03/30 0800) Antibody: PENDING (03/30 0800) Rubella: 9.04 (09/14 1226) RPR: Non Reactive (01/10 0904)  HBsAg: Negative (09/14 1226)  HIV: Non Reactive (01/10 0904)  GBS: Negative/-- (03/02 1600)  2 hr Glucola wnl Genetic screening  wnl Anatomy 05[birth year]-1992 wnl except for echogenic cardiac focus  Prenatal Transfer Tool  Maternal Diabetes: No Genetic Screening: Normal Maternal Ultrasounds/Referrals: Normal except for echogenic cardiac focus Fetal Ultrasounds or other Referrals:  None Maternal Substance Abuse:  Yes:  Type: Marijuana Significant Maternal Medications:  None Significant Maternal Lab Results: Group B Strep negative  Results for orders placed or performed during the hospital encounter of 09/21/20 (from the past 24 hour(s))  Type and screen   Collection Time: 09/21/20  8:00 AM  Result Value Ref Range   ABO/RH(D) PENDING    Antibody Screen PENDING    Sample Expiration      09/24/2020,2359 Performed at [facility], [address] [place], [place]     Patient Active Problem List   Diagnosis Date Noted  . Post term pregnancy over 40 weeks 09/21/2020  . Uterine size-date discrepancy in third trimester 09/08/2020  . History of herpes simplex infection 05/25/2020  . Recurrent UTI (urinary tract infection) complicating pregnancy 03/21/2020  . Chlamydia 03/21/2020  . Marijuana use 03/16/2020  . Rh negative status during pregnancy 03/14/2020  . Encounter for supervision of normal pregnancy, antepartum 03/08/2020  . Abnormal Pap smear of cervix 06/27/2018    Assessment/Plan:   Chelsea Burgess is a 25 y.o. G3P1011 at [redacted]w[redacted]d here for post-dates IOL.  #Labor: Given initial cervical exam, will start with cytotec. #Pain: TBD per pt request #FWB:  Category 1 strip #ID: GBS negative #MOF: breast #MOC: plan for post-placental IUD; pt consented and IUD ordered on  #Circ: n/a #H/o HSV: pt reports good adherence to valtrex, no recent outbreaks. Pt denies prodromal symptoms and no visualized lesions on speculum exam. #Rh negative status: s/p rhogam in pregnancy. Plan for rhogam workup in postpartum period. #Marijuana use in pregnancy: plan for SW consult in postpartum period  [redacted]w[redacted]d, MD  09/21/2020, 9:10 AM

## 2020-09-21 NOTE — Discharge Summary (Addendum)
Postpartum Discharge Summary  Date of Service updated 09/23/20    Patient Name: Chelsea Burgess DOB: 1995/06/30 MRN: [id number]  Date of admission: 09/21/2020 Delivery date:09/21/2020  Delivering provider: Serita Grammes D  Date of discharge: 09/23/2020  Admitting diagnosis: Post term pregnancy over 40 weeks [O48.0] Intrauterine pregnancy: [redacted]w[redacted]d    Secondary diagnosis:  Active Problems:   Rh negative status during pregnancy   Recurrent UTI (urinary tract infection) complicating pregnancy   History of herpes simplex infection   Post term pregnancy over 40 weeks   IUD (intrauterine device) in place  Additional problems: none    Discharge diagnosis: Term Pregnancy Delivered                                              Post partum procedures:  Liletta inserted postplacentally; Rhogam   Augmentation: Pitocin, Cytotec and IP Foley Complications: None  Hospital course: Induction of Labor With Vaginal Delivery   25y.o. yo G3P1011 at 426w0das admitted to the hospital 09/21/2020 for induction of labor.  Indication for induction: Postdates.  Patient had an uncomplicated labor course, delivering the evening of her induction day, as follows: Membrane Rupture Time/Date: 11:01 PM ,09/21/2020   Delivery Method:Vaginal, Spontaneous  Episiotomy: None  Lacerations:  None  Details of delivery can be found in separate delivery note. She requested a postplacental IUD placement and tolerated it well.  Patient had a routine postpartum course. Patient is discharged home 09/23/20.  Newborn Data: Birth date:09/21/2020  Birth time:11:01 PM  Gender:Female  Living status:Living  Apgars:9 ,9  Weight:3.065 kg (6lb 12.1oz)  Magnesium Sulfate received: No BMZ received: No Rhophylac:Yes ordered (baby O+) MMR:N/A T-DaP:Given prenatally Flu: No Transfusion:No  Physical exam  Vitals:   09/22/20 0917 09/22/20 1527 09/22/20 2251 09/23/20 0539  BP: 110/73 101/76 111/72 107/73  Pulse: 68 85 74 78   Resp: '20 18 18 18  ' Temp: 98.2 F (36.8 C) 98.5 F (36.9 C) 98.4 F (36.9 C) 97.8 F (36.6 C)  TempSrc: Oral Oral Oral Oral  SpO2: 100% 99%  100%  Weight:      Height:       General: alert and cooperative Lochia: appropriate Uterine Fundus: firm Incision: N/A DVT Evaluation: No evidence of DVT seen on physical exam. Labs: Lab Results  Component Value Date   WBC 6.7 09/21/2020   HGB 12.1 09/21/2020   HCT 38.5 09/21/2020   MCV 82.4 09/21/2020   PLT 206 09/21/2020   CMP Latest Ref Rng & Units 01/18/2019  Glucose 70 - 99 mg/dL 96  BUN 6 - 20 mg/dL 9  Creatinine 0.44 - 1.00 mg/dL 0.65  Sodium 135 - 145 mmol/L 138  Potassium 3.5 - 5.1 mmol/L 3.6  Chloride 98 - 111 mmol/L 107  CO2 22 - 32 mmol/L 22  Calcium 8.9 - 10.3 mg/dL 9.0  Total Protein 6.5 - 8.1 g/dL 6.6  Total Bilirubin 0.3 - 1.2 mg/dL 0.8  Alkaline Phos 38 - 126 U/L 80  AST 15 - 41 U/L 15  ALT 0 - 44 U/L 13   Edinburgh Score: Edinburgh Postnatal Depression Scale Screening Tool 09/22/2020  I have been able to laugh and see the funny side of things. 0  I have looked forward with enjoyment to things. 0  I have blamed myself unnecessarily when things went wrong. 0  I have  been anxious or worried for no good reason. 1  I have felt scared or panicky for no good reason. 0  Things have been getting on top of me. 0  I have been so unhappy that I have had difficulty sleeping. 0  I have felt sad or miserable. 0  I have been so unhappy that I have been crying. 0  The thought of harming myself has occurred to me. 0  Edinburgh Postnatal Depression Scale Total 1     After visit meds:  Allergies as of 09/23/2020   No Known Allergies      Medication List     STOP taking these medications    acyclovir 400 MG tablet Commonly known as: ZOVIRAX   amoxicillin-clavulanate 875-125 MG tablet Commonly known as: Augmentin       TAKE these medications    ibuprofen 100 MG/5ML suspension Commonly known as: ADVIL Take  30 mLs (600 mg total) by mouth every 6 (six) hours. As needed for pain/cramps   prenatal multivitamin Tabs tablet Take 1 tablet by mouth daily at 12 noon.         Discharge home in stable condition Infant Feeding: Breast Infant Disposition:home with mother Discharge instruction: per After Visit Summary and Postpartum booklet. Activity: Advance as tolerated. Pelvic rest for 6 weeks.  Diet: routine diet Future Appointments: Future Appointments  Date Time Provider [place]  10/27/2020 10:10 AM Cresenzo-Dishmon, Joaquim Lai, CNM [facility]   Follow up Visit:  Follow-up Information     [facility] Follow up on 10/27/2020.   Specialty: Obstetrics and Gynecology Why: for postpartum checkup, IUD check Contact information: [place] [phone]               (postpartum appt already scheduled)   09/23/2020 Christin Fudge, CNM

## 2020-09-21 NOTE — Progress Notes (Signed)
LABOR PROGRESS NOTE  Chelsea Burgess is a 25 y.o. G3P1011 at [redacted]w[redacted]d  admitted for IOL for post dates.   Subjective: Feeling more contractions.   Objective: BP 115/77   Pulse 69   Temp 98.7 F (37.1 C) (Oral)   Resp 18   Ht 5\' 7"  (1.702 m)   Wt 66.9 kg   LMP 12/09/2019 (Exact Date)   BMI 23.10 kg/m  or  Vitals:   09/21/20 1731 09/21/20 1830 09/21/20 1900 09/21/20 1930  BP: 130/79 122/85 118/84 115/77  Pulse:  67 (!) 58 69  Resp: 16 16 18 18   Temp:    98.7 F (37.1 C)  TempSrc:    Oral  Weight:      Height:       Dilation: 4 Effacement (%): 70 Station: -2 Presentation: Vertex Exam by:: , RN FHT: baseline rate 125, moderate varibility, + acel, no decel Toco: Q 1-2 min  Labs: Lab Results  Component Value Date   WBC 6.7 09/21/2020   HGB 12.1 09/21/2020   HCT 38.5 09/21/2020   MCV 82.4 09/21/2020   PLT 206 09/21/2020    Patient Active Problem List   Diagnosis Date Noted  . Post term pregnancy over 40 weeks 09/21/2020  . Uterine size-date discrepancy in third trimester 09/08/2020  . History of herpes simplex infection 05/25/2020  . Recurrent UTI (urinary tract infection) complicating pregnancy 03/21/2020  . Chlamydia 03/21/2020  . Marijuana use 03/16/2020  . Rh negative status during pregnancy 03/14/2020  . Encounter for supervision of normal pregnancy, antepartum 03/08/2020  . Abnormal Pap smear of cervix 06/27/2018    Assessment / Plan: 25 y.o. G3P1011 at [redacted]w[redacted]d here for IOL for post dates.   Labor: Progressing slightly.  S/p Cytotec x1 and FB (1745).   Fetal Wellbeing: Category 1, reassuring Pain Control: IV pain medicine and epidural as requested Anticipated MOD: Vaginal Hx of HSV: Patient on Valtrex with no recent outbreaks.  No prodromal symptoms.   Rh- status: S/p RhoGam in pregnancy.  RhoGam work-up in the postpartum period. Marijuana use in pregnancy: Plan for social work consult when postpartum.   22 Chelsea Burgess PGY-1 09/21/2020,  8:29 PM

## 2020-09-21 NOTE — Progress Notes (Signed)
LABOR PROGRESS NOTE  KERIANA SARSFIELD is a 25 y.o. G3P1011 at [redacted]w[redacted]d  admitted for IOL for post dates.   Subjective: Feeling more uncomfortable at this time. Is agreeable to foley balloon if not progressing well.   Objective: BP 130/79   Pulse 66   Temp 98.4 F (36.9 C) (Oral)   Resp 16   Ht 5\' 7"  (1.702 m)   Wt 66.9 kg   LMP 12/09/2019 (Exact Date)   BMI 23.10 kg/m  or  Vitals:   09/21/20 1555 09/21/20 1630 09/21/20 1700 09/21/20 1731  BP: 104/73 125/85 116/81 130/79  Pulse: 67 72 66   Resp:   18 16  Temp:      TempSrc:      Weight:      Height:       Dilation: 2 Effacement (%): 50 Station: -2 Presentation: Vertex Exam by:: Dr. [email or web address] FHT: baseline rate 135, moderate varibility, + acel, no decel Toco: Q 2-3 min  Labs: Lab Results  Component Value Date   WBC 6.7 09/21/2020   HGB 12.1 09/21/2020   HCT 38.5 09/21/2020   MCV 82.4 09/21/2020   PLT 206 09/21/2020    Patient Active Problem List   Diagnosis Date Noted  . Post term pregnancy over 40 weeks 09/21/2020  . Uterine size-date discrepancy in third trimester 09/08/2020  . History of herpes simplex infection 05/25/2020  . Recurrent UTI (urinary tract infection) complicating pregnancy 03/21/2020  . Chlamydia 03/21/2020  . Marijuana use 03/16/2020  . Rh negative status during pregnancy 03/14/2020  . Encounter for supervision of normal pregnancy, antepartum 03/08/2020  . Abnormal Pap smear of cervix 06/27/2018    Assessment / Plan: 25 y.o. G3P1011 at [redacted]w[redacted]d here for IOL for post dates.   Labor: Progressing slightly.  S/p Cytotec x1. Pitocin at 10 when we enter the room. Foley balloon placed and pitocin decreased to 6. Fetal Wellbeing: Category 1, reassuring Pain Control: IV pain medicine and epidural as requested Anticipated MOD: Vaginal Hx of HSV: Patient on Valtrex with no recent outbreaks.  No prodromal symptoms.  No visualized lesions on speculum exam. Rh- status: S/p RhoGam in pregnancy.  RhoGam  work-up in the postpartum period. Marijuana use in pregnancy: Plan for social work consult when postpartum.   [redacted]w[redacted]d, MD  PGY-2, [facility]  09/21/2020, 5:49 PM

## 2020-09-21 NOTE — Anesthesia Procedure Notes (Signed)
Epidural Patient location during procedure: OB Start time: 09/21/2020 9:12 PM End time: 09/21/2020 9:22 PM  Staffing Anesthesiologist: Mellody Dance, MD Performed: anesthesiologist   Preanesthetic Checklist Completed: patient identified, IV checked, site marked, risks and benefits discussed, monitors and equipment checked, pre-op evaluation and timeout performed  Epidural Patient position: sitting Prep: DuraPrep Patient monitoring: heart rate, cardiac monitor, continuous pulse ox and blood pressure Approach: midline Location: L2-L3 Injection technique: LOR saline  Needle:  Needle type: Tuohy  Needle gauge: 17 G Needle length: 9 cm Needle insertion depth: 5 cm Catheter type: closed end flexible Catheter size: 20 Guage Catheter at skin depth: 10 cm Test dose: negative and Other  Assessment Events: blood not aspirated, injection not painful, no injection resistance and negative IV test  Additional Notes Informed consent obtained prior to proceeding including risk of failure, 1% risk of PDPH, risk of minor discomfort and bruising.  Discussed rare but serious complications including epidural abscess, permanent nerve injury, epidural hematoma.  Discussed alternatives to epidural analgesia and patient desires to proceed.  Timeout performed pre-procedure verifying patient name, procedure, and platelet count.  Patient tolerated procedure well.

## 2020-09-21 NOTE — Anesthesia Preprocedure Evaluation (Signed)
Anesthesia Evaluation  Patient identified by MRN, date of birth, ID band Patient awake    Reviewed: Allergy & Precautions, NPO status , Patient's Chart, lab work & pertinent test results  Airway Mallampati: II  TM Distance: >3 FB Neck ROM: Full    Dental no notable dental hx.    Pulmonary neg pulmonary ROS,    Pulmonary exam normal breath sounds clear to auscultation       Cardiovascular negative cardio ROS Normal cardiovascular exam Rhythm:Regular Rate:Normal     Neuro/Psych negative neurological ROS  negative psych ROS   GI/Hepatic negative GI ROS, Neg liver ROS,   Endo/Other  negative endocrine ROS  Renal/GU negative Renal ROS  negative genitourinary   Musculoskeletal negative musculoskeletal ROS (+)   Abdominal   Peds negative pediatric ROS (+)  Hematology negative hematology ROS (+)   Anesthesia Other Findings   Reproductive/Obstetrics (+) Pregnancy                             Anesthesia Physical Anesthesia Plan  ASA: II  Anesthesia Plan: Epidural   Post-op Pain Management:    Induction:   PONV Risk Score and Plan: 2 and Treatment may vary due to age or medical condition  Airway Management Planned: Natural Airway  Additional Equipment:   Intra-op Plan:   Post-operative Plan:   Informed Consent: I have reviewed the patients History and Physical, chart, labs and discussed the procedure including the risks, benefits and alternatives for the proposed anesthesia with the patient or authorized representative who has indicated his/her understanding and acceptance.       Plan Discussed with: Anesthesiologist  Anesthesia Plan Comments:         Anesthesia Quick Evaluation

## 2020-09-22 ENCOUNTER — Encounter (HOSPITAL_COMMUNITY): Payer: Self-pay | Admitting: Obstetrics and Gynecology

## 2020-09-22 DIAGNOSIS — Z975 Presence of (intrauterine) contraceptive device: Secondary | ICD-10-CM

## 2020-09-22 LAB — RPR: RPR Ser Ql: NONREACTIVE

## 2020-09-22 MED ORDER — COMPLETENATE 29-1 MG PO CHEW: 1.0000 | CHEWABLE_TABLET | Freq: Every day | ORAL | Status: DC

## 2020-09-22 MED ORDER — ONDANSETRON HCL 4 MG PO TABS: 4.0000 mg | ORAL_TABLET | ORAL | Status: DC | PRN

## 2020-09-22 MED ORDER — TETANUS-DIPHTH-ACELL PERTUSSIS 5-2.5-18.5 LF-MCG/0.5 IM SUSY: 0.5000 mL | PREFILLED_SYRINGE | Freq: Once | INTRAMUSCULAR | Status: DC

## 2020-09-22 MED ORDER — IBUPROFEN 100 MG/5ML PO SUSP
600.0000 mg | Freq: Four times a day (QID) | ORAL | Status: DC
  Administered 2020-09-22 – 2020-09-23 (×4): 600 mg via ORAL
  Filled 2020-09-22 (×5): qty 30

## 2020-09-22 MED ORDER — SIMETHICONE 80 MG PO CHEW
80.0000 mg | CHEWABLE_TABLET | ORAL | Status: DC | PRN
Start: 2020-09-22 — End: 2020-09-23

## 2020-09-22 MED ORDER — PRENATAL MULTIVITAMIN CH: 1.0000 | ORAL_TABLET | Freq: Every day | ORAL | Status: DC

## 2020-09-22 MED ORDER — RHO D IMMUNE GLOBULIN 1500 UNIT/2ML IJ SOSY
300.0000 ug | PREFILLED_SYRINGE | Freq: Once | INTRAMUSCULAR | Status: AC
  Administered 2020-09-22: 300 ug via INTRAMUSCULAR
  Filled 2020-09-22: qty 2

## 2020-09-22 MED ORDER — IBUPROFEN 600 MG PO TABS
600.0000 mg | ORAL_TABLET | Freq: Four times a day (QID) | ORAL | Status: DC
  Filled 2020-09-22: qty 1

## 2020-09-22 MED ORDER — ACETAMINOPHEN 325 MG PO TABS: 650.0000 mg | ORAL_TABLET | ORAL | Status: DC | PRN

## 2020-09-22 MED ORDER — SENNOSIDES-DOCUSATE SODIUM 8.6-50 MG PO TABS
2.0000 | ORAL_TABLET | Freq: Every day | ORAL | Status: DC
  Filled 2020-09-22: qty 2

## 2020-09-22 MED ORDER — WITCH HAZEL-GLYCERIN EX PADS: 1.0000 "application " | MEDICATED_PAD | CUTANEOUS | Status: DC | PRN

## 2020-09-22 MED ORDER — COCONUT OIL OIL: 1.0000 "application " | TOPICAL_OIL | Status: DC | PRN

## 2020-09-22 MED ORDER — ONDANSETRON HCL 4 MG/2ML IJ SOLN: 4.0000 mg | INTRAMUSCULAR | Status: DC | PRN

## 2020-09-22 MED ORDER — BENZOCAINE-MENTHOL 20-0.5 % EX AERO: 1.0000 "application " | INHALATION_SPRAY | CUTANEOUS | Status: DC | PRN

## 2020-09-22 MED ORDER — ZOLPIDEM TARTRATE 5 MG PO TABS: 5.0000 mg | ORAL_TABLET | Freq: Every evening | ORAL | Status: DC | PRN

## 2020-09-22 MED ORDER — ACETAMINOPHEN 160 MG/5ML PO SOLN
650.0000 mg | ORAL | Status: DC | PRN
  Administered 2020-09-22: 650 mg via ORAL
  Filled 2020-09-22: qty 20.3

## 2020-09-22 MED ORDER — DIBUCAINE (PERIANAL) 1 % EX OINT: 1.0000 "application " | TOPICAL_OINTMENT | CUTANEOUS | Status: DC | PRN

## 2020-09-22 MED ORDER — DIPHENHYDRAMINE HCL 25 MG PO CAPS: 25.0000 mg | ORAL_CAPSULE | Freq: Four times a day (QID) | ORAL | Status: DC | PRN

## 2020-09-22 NOTE — Clinical Social Work Maternal (Addendum)
CLINICAL SOCIAL WORK MATERNAL/CHILD NOTE  Patient Details  Name: GIULLIANA MCROBERTS MRN: [id number] Date of Birth: [birth year]  Date:  09/22/2020  Clinical Social Worker Initiating Note:  Kathrin [place], [place] Date/Time: Initiated:  09/22/20/1011     Child's Name:  Laneta Simmers   Biological Parents:  Mother,Father   Need for Interpreter:  None   Reason for Referral:  Current Substance Use/Substance Use During Pregnancy    Address:  [address] [place] [address]    Phone number:  [phone] (home)     Additional phone number:  Household Members/Support Persons (HM/SP):   Household Member/Support Person 1,Household Member/Support Person 2   HM/SP Name Relationship DOB or Age  HM/SP -[place] Significant Other 25  HM/SP -2 [place] Child 1  HM/SP -3        HM/SP -4        HM/SP -5        HM/SP -6        HM/SP -7        HM/SP -8          Natural Supports (not living in the home):  Extended Family   Professional Supports:     Employment: Unemployed   Type of Work:     Education:  Programmer, systems   Homebound arranged:    Museum/gallery curator Resources:  [employer]   Other Resources:  WIC,Child Support   Cultural/Religious Considerations Which May Impact Care: none   Strengths:  Ability to meet basic needs ,Pediatrician chosen   Psychotropic Medications:         Pediatrician:    Solicitor area  Lexicographer List:   [place] for [place]      Pediatrician Fax Number:    Risk Factors/Current Problems:  Substance Use    Cognitive State:  Insightful ,Linear Thinking ,Alert ,Able to Concentrate    Mood/Affect:  Calm ,Bright ,Comfortable    CSW Assessment: CSW received consult for substance exposed newborn. CSW met with MOB to offer support and complete assessment.    CSW met with the MOB at  bedside. CSW introduced role and congratulated MOB and FOB. CSW observed MOB resting in bed and infant sleeping in the bassinet. MOB receptive to [place] visit. CSW offered MOB privacy and explain HIPPA. MOB agreeable for FOB to stay in the room. CSW asked MOB how she feels since giving birth. MOB reports, "I feel good." CSW asked MOB about pregnancy. MOB reports her pregnancy was good.   CSW explain the reason for the visit.  MOB reports, "I did not use any substances or marijuana during my pregnancy. I did not use before my pregnancy. It has to be because of secondhand smoke. " MOB reports when she checked her My Chart, she was surprised that her urine resulted positive. CSW asked if MOB discussed this with her doctor. MOB reports she just didn't worry about it.  CSW explain the hospital drug screen policy. CSW informed MOB if the infant test positive for any substance, then CPS will be notified. CSW informed her CSW will follow the umbilical cord and urine until it results. MOB reports understanding. CSW asked MOB if she has CPS history or current case. MOB denies CSW history or current case.   CSW asked MOB about the members of her household. MOB reports she lives  with the FOB [name]) and her child ([place]). CSW asked MOB her supports. MOB reports the FOB, and her mother are her supports. CSW asked MOB if she is receiving food stamps/WIC. MOB reports she is actively receiving WIC and food stamps and knows to call and add the infant.   CSW asked MOB if she has mental health history. MOB reports no mental health history, medications or therapy. CSW assessed for safety, MOB denies suicidal and homicidal thoughts. CSW provided education regarding the baby blues period vs. perinatal mood disorders, discussed treatment and gave resources for mental health follow up if concerns arise.  CSW recommended MOB complete a self-evaluation during the postpartum time period using the New Mom Checklist  from Postpartum Progress and encouraged MOB to contact a medical professional if symptoms are noted at any time.  MOB receptive to therapy resources.    CSW provided review of Sudden Infant Death Syndrome (SIDS) precautions and informed MOB no co-sleeping with the infant. MOB reports understanding. CSW asked MOB if she has items for the infant. MOB reports she has items for the infant including a car seat and bassinet. CSW assessed for additional needs. MOB reports no further need.   CSW identifies no further need for intervention and no barriers to discharge at this time.    CSW Plan/Description:  No Further Intervention Required/No Barriers to Discharge,Perinatal Mood and Anxiety Disorder (PMADs) Education,Hospital Drug Screen Policy Information,Child Protective Service Report ,CSW Will Continue to Monitor Umbilical Cord Tissue Drug Screen Results and Make Report if Warranted,Sudden Infant Death Syndrome (SIDS) Education    Lia Hopping, LCSW 09/22/2020, 10:28 AM

## 2020-09-22 NOTE — Progress Notes (Addendum)
Post Partum Day 1 Subjective: no complaints, up ad lib, voiding, tolerating PO and + flatus  Objective: Blood pressure 107/68, pulse 84, temperature 98.5 F (36.9 C), temperature source Oral, resp. rate 18, height 5\' 7"  (1.702 m), weight 66.9 kg, last menstrual period 12/09/2019, SpO2 99 %, unknown if currently breastfeeding.  Physical Exam:  General: alert and cooperative Lochia: appropriate Uterine Fundus: firm Incision: NA DVT Evaluation: No evidence of DVT seen on physical exam.  Recent Labs    09/21/20 0808  HGB 12.1  HCT 38.5    Assessment/Plan: Plan for discharge tomorrow   LOS: 1 day   09/23/20 09/22/2020, 7:38 AM   CNM attestation Post Partum Day #1 I have seen and examined this patient and agree with above documentation in the resident's note.   Chelsea Burgess is a 25 y.o. [phone] s/p vag del.  Pt denies problems with ambulating, voiding or po intake. Pain is well controlled.  Plan for birth control is IUD- placed postplacentally.  Method of Feeding: bottle  PE:  BP 107/68 (BP Location: Left Arm)   Pulse 84   Temp 98.5 F (36.9 C) (Oral)   Resp 18   Ht 5\' 7"  (1.702 m)   Wt 66.9 kg   LMP 12/09/2019 (Exact Date)   SpO2 99%   Breastfeeding Unknown   BMI 23.10 kg/m  Fundus firm  Plan for discharge: 09/23/20  12/11/2019, CNM 8:24 AM  09/22/2020

## 2020-09-22 NOTE — Anesthesia Postprocedure Evaluation (Signed)
Anesthesia Post Note  Patient: ADESSA PRIMIANO  Procedure(s) Performed: AN AD HOC LABOR EPIDURAL     Patient location during evaluation: Mother Baby Anesthesia Type: Epidural Level of consciousness: awake, awake and alert and oriented Pain management: pain level controlled Vital Signs Assessment: post-procedure vital signs reviewed and stable Respiratory status: spontaneous breathing and respiratory function stable Cardiovascular status: blood pressure returned to baseline Postop Assessment: no headache, epidural receding, patient able to bend at knees, adequate PO intake, no backache, no apparent nausea or vomiting and able to ambulate Anesthetic complications: no   No complications documented.  Last Vitals:  Vitals:   09/22/20 0202 09/22/20 0553  BP: 100/71 107/68  Pulse: 82 84  Resp: 18 18  Temp: 36.8 C 36.9 C  SpO2: 100% 99%    Last Pain:  Vitals:   09/22/20 0902  TempSrc:   PainSc: 0-No pain   Pain Goal:                   Cleda Clarks

## 2020-09-23 LAB — RH IG WORKUP (INCLUDES ABO/RH)
ABO/RH(D): O NEG
Fetal Screen: NEGATIVE
Gestational Age(Wks): 41
Unit division: 0

## 2020-09-23 MED ORDER — IBUPROFEN 100 MG/5ML PO SUSP: 600.0000 mg | Freq: Four times a day (QID) | ORAL | 1 refills | Status: DC

## 2020-10-27 ENCOUNTER — Ambulatory Visit (INDEPENDENT_AMBULATORY_CARE_PROVIDER_SITE_OTHER): Payer: Medicaid Other | Admitting: Obstetrics & Gynecology

## 2020-10-27 ENCOUNTER — Encounter: Payer: Self-pay | Admitting: Obstetrics & Gynecology

## 2020-10-27 ENCOUNTER — Ambulatory Visit: Payer: Medicaid Other | Admitting: Advanced Practice Midwife

## 2020-10-27 ENCOUNTER — Other Ambulatory Visit: Payer: Self-pay

## 2020-10-27 DIAGNOSIS — Z3202 Encounter for pregnancy test, result negative: Secondary | ICD-10-CM | POA: Diagnosis not present

## 2020-10-27 DIAGNOSIS — R3989 Other symptoms and signs involving the genitourinary system: Secondary | ICD-10-CM | POA: Diagnosis not present

## 2020-10-27 DIAGNOSIS — Z30430 Encounter for insertion of intrauterine contraceptive device: Secondary | ICD-10-CM | POA: Diagnosis not present

## 2020-10-27 LAB — POCT URINALYSIS DIPSTICK OB
Glucose, UA: NEGATIVE
Leukocytes, UA: NEGATIVE
Nitrite, UA: NEGATIVE
POC,PROTEIN,UA: NEGATIVE

## 2020-10-27 LAB — POCT URINE PREGNANCY: Preg Test, Ur: NEGATIVE

## 2020-10-27 MED ORDER — LEVONORGESTREL 20.1 MCG/DAY IU IUD
1.0000 | INTRAUTERINE_SYSTEM | Freq: Once | INTRAUTERINE | Status: AC
  Administered 2020-10-27: 1 via INTRAUTERINE

## 2020-10-27 NOTE — Progress Notes (Signed)
POSTPARTUM VISIT Patient name: Chelsea Burgess MRN [id number]  Date of birth: 04-01-1996 Chief Complaint:   Postpartum Care  History of Present Illness:   Chelsea Burgess is a 24 y.o. [phone]  female being seen today for a postpartum visit. She is 5 weeks postpartum following a spontaneous vaginal delivery at 71 gestational weeks. IOL: yes, for postdates. Anesthesia: epidural.  Laceration: none.  Complications: none. Inpatient contraception: yes Liletta IUD.   Pregnancy uncomplicated. Tobacco use: no. Substance use disorder: no. Last pap smear: 03/2020 and results were NILM w/ HRHPV not done. Next pap smear due: 2024 No LMP recorded.  Postpartum course has been uncomplicated. Bleeding flow about like a period. Bowel function is normal. Bladder function is normal. Urinary incontinence? no, fecal incontinence? no Patient is not sexually active. Last sexual activity: prior to birth of baby. Desired contraception: PP IUD placed- however device fell out.   Reviewed options, she wishes to have it replaced today.  She also notes urinary urgency and discomfort.  History of recurrent UTIs during pregnancy   Upstream - 10/27/20 1559      Pregnancy Intention Screening   Does the patient want to become pregnant in the next year? No    Does the patient's partner want to become pregnant in the next year? No    Would the patient like to discuss contraceptive options today? No      Contraception Wrap Up   Current Method Abstinence    Contraception Counseling Provided Yes          The pregnancy intention screening data noted above was reviewed. Potential methods of contraception were discussed. The patient elected to proceed with IUD or IUS.   Edinburgh Postpartum Depression Screening: negative  Edinburgh Postnatal Depression Scale - 10/27/20 1558      Edinburgh Postnatal Depression Scale:  In the Past 7 Days   I have been able to laugh and see the funny side of things. 0    I have  looked forward with enjoyment to things. 0    I have blamed myself unnecessarily when things went wrong. 0    I have been anxious or worried for no good reason. 0    I have felt scared or panicky for no good reason. 0    Things have been getting on top of me. 0    I have been so unhappy that I have had difficulty sleeping. 0    I have felt sad or miserable. 0    I have been so unhappy that I have been crying. 0    The thought of harming myself has occurred to me. 0    Edinburgh Postnatal Depression Scale Total 0          Baby's course has been uncomplicated. Baby is feeding by bottle. Infant has a pediatrician/family doctor? Yes.  Childcare strategy if returning to work/school: n/a-not working/going back to school.  Pt has material needs met for her and baby: Yes.   Review of Systems:   Pertinent items are noted in HPI Denies Abnormal vaginal discharge w/ itching/odor/irritation, headaches, visual changes, shortness of breath, chest pain, abdominal pain, severe nausea/vomiting, or bowel movements. Pertinent History Reviewed:  Reviewed past medical,surgical, obstetrical and family history.  Reviewed problem list, medications and allergies. OB History  Gravida Para Term Preterm AB Living  _0 0 1 2  SAB IAB Ectopic Multiple Live Births  1 0 0 0 2    # Outcome Date GA  Lbr Len/2nd Weight Sex Delivery Anes PTL Lv  3 Term 09/21/20 [redacted]w[redacted]d/ 00:21 6 lb 12.1 oz (3.065 kg) F Vag-Spont EPI  LIV  2 Term 11/29/18 480w3d5:46 / 02:07 5 lb 14.7 oz (2.685 kg) M Vag-Spont EPI N LIV  1 SAB 2019           Physical Assessment:   Vitals:   10/27/20 1553  BP: 124/76  Pulse: 82  Weight: 133 lb 12.8 oz (60.7 kg)  Height: _0  (1.702 m)  Body mass index is 20.96 kg/m.       Physical Examination:   General appearance: alert, well appearing, and in no distress  Mental status: normal mood, behavior, speech, dress, motor activity, and thought processes  Skin: warm & dry   Cardiovascular: normal  heart rate noted   Respiratory: normal respiratory effort, no distress   Breasts: deferred, no complaints   Abdomen: soft, non-tender   Pelvic: normal external genitalia, vulva, vagina, cervix, uterus and adnexa. Thin prep pap obtained: No  Rectal: not examined  Extremities: Edema: none        Results for orders placed or performed in visit on 10/27/20 (from the past 24 hour(s))  POCT urine pregnancy   Collection Time: 10/27/20  4:27 PM  Result Value Ref Range   Preg Test, Ur Negative Negative  POC Urinalysis Dipstick OB   Collection Time: 10/27/20  4:28 PM  Result Value Ref Range   Color, UA     Clarity, UA     Glucose, UA Negative Negative   Bilirubin, UA     Ketones, UA trace    Spec Grav, UA     Blood, UA small    pH, UA     POC,PROTEIN,UA Negative Negative, Trace, Small (1+), Moderate (2+), Large (3+), 4+   Urobilinogen, UA     Nitrite, UA neg    Leukocytes, UA Negative Negative   Appearance     Odor       IUD INSERTION The risks and benefits of the method and placement have been thouroughly reviewed with the patient and all questions were answered.  Specifically the patient is aware of failure rate of 06/998, expulsion of the IUD and of possible perforation.  The patient is aware of irregular bleeding due to the method and understands the incidence of irregular bleeding diminishes with time.  Signed copy of informed consent in chart.   Time out was performed.  A Graves speculum was placed in the vagina.  The cervix was visualized, prepped using Betadine, and grasped with a single tooth tenaculum. The uterus was found to be retroflexed and it sounded to 8 cm.  Liletta  IUD placed per manufacturer's recommendations. The strings were trimmed to approximately 3 cm. The patient tolerated the procedure well.   Informal transvaginal sonogram was performed and the proper placement of the IUD was verified.  Chaperone: [place] 1) Postpartum  exam 2) 5 wks s/p spontaneous vaginal delivery 3) breast feeding 4) Depression screening 5) Liletta IUD insertion- f/u in 6-8wks for IUD check 6) Will r/o underlying UTI She was given a care card with date IUD placed, and date IUD to be removed.  She is scheduled for a f/u appointment in 4 weeks.  Essential components of care per ACOG recommendations:  . 1.  Mood and well being appropriate  2. Infant care and feeding:  . If breastfeeding, discussed returning to work, pumping, breastfeeding-associated pain, guidance  regarding return to fertility while lactating if not using another method. If needed, patient was provided with a letter to be allowed to pump q 2-3hrs to support lactation in a private location with access to a refrigerator to store breastmilk.   . Recommended that all caregivers be immunized for flu, pertussis and other preventable communicable diseases . If pt does not have material needs met for her/baby, referred to local resources for help obtaining these.  3. Sexuality, contraception and birth spacing . Provided guidance regarding sexuality, management of dyspareunia, and resumption of intercourse . Discussed avoiding interpregnancy interval <33mhs and recommended birth spacing of 18 months  4. Sleep and fatigue . Discussed coping options for fatigue and sleep disruption . Encouraged family/partner/community support of 4 hrs of uninterrupted sleep to help with mood and fatigue  5. Physical recovery  Patient is safe to resume physical activity. Discussed attainment of healthy weight.  Advised another 5-7 days prior to return to IC.  7. Health maintenance . Mammogram at [place] earlier if indicated . Pap smears as indicated, currently up to date .  Meds:  Meds ordered this encounter  Medications  . levonorgestrel (LILETTA) 20.1 MCG/DAY IUD 1 each    Follow-up: No follow-ups on file.   Orders Placed This Encounter  Procedures  . Urine Culture  . POCT urine  pregnancy  . POC Urinalysis Dipstick OB   JJanyth Pupa DO Attending [place] [facility] [facility] [place]

## 2020-11-02 ENCOUNTER — Other Ambulatory Visit: Payer: Self-pay | Admitting: Obstetrics & Gynecology

## 2020-11-02 MED ORDER — NITROFURANTOIN MONOHYD MACRO 100 MG PO CAPS: 100.0000 mg | ORAL_CAPSULE | Freq: Two times a day (BID) | ORAL | 0 refills | Status: AC

## 2020-11-02 NOTE — Progress Notes (Signed)
Rx sent in for UTI.

## 2020-11-03 ENCOUNTER — Other Ambulatory Visit: Payer: Self-pay | Admitting: *Deleted

## 2020-11-03 ENCOUNTER — Other Ambulatory Visit: Payer: Self-pay | Admitting: Obstetrics & Gynecology

## 2020-11-03 LAB — URINE CULTURE

## 2020-11-03 MED ORDER — AMOXICILLIN-POT CLAVULANATE 250-62.5 MG/5ML PO SUSR: 250.0000 mg | Freq: Two times a day (BID) | ORAL | 0 refills | Status: AC

## 2020-11-03 MED ORDER — AMOXICILLIN-POT CLAVULANATE 250-62.5 MG/5ML PO SUSR: 250.0000 mg | Freq: Two times a day (BID) | ORAL | 0 refills | Status: DC

## 2021-01-02 ENCOUNTER — Other Ambulatory Visit: Payer: Self-pay

## 2021-01-02 ENCOUNTER — Encounter: Payer: Self-pay | Admitting: Adult Health

## 2021-01-02 ENCOUNTER — Ambulatory Visit: Payer: Medicaid Other | Admitting: Adult Health

## 2021-01-02 ENCOUNTER — Ambulatory Visit (INDEPENDENT_AMBULATORY_CARE_PROVIDER_SITE_OTHER): Payer: Medicaid Other | Admitting: Adult Health

## 2021-01-02 ENCOUNTER — Other Ambulatory Visit (HOSPITAL_COMMUNITY)
Admission: RE | Admit: 2021-01-02 | Discharge: 2021-01-02 | Disposition: A | Discharge status: Home / Self Care | Payer: Medicaid Other | Source: Ambulatory Visit | Attending: Adult Health | Admitting: Adult Health

## 2021-01-02 VITALS — BP 116/80 | HR 72 | Ht 67.0 in | Wt 135.2 lb

## 2021-01-02 DIAGNOSIS — Z30431 Encounter for routine checking of intrauterine contraceptive device: Secondary | ICD-10-CM | POA: Diagnosis not present

## 2021-01-02 DIAGNOSIS — N939 Abnormal uterine and vaginal bleeding, unspecified: Secondary | ICD-10-CM

## 2021-01-02 DIAGNOSIS — Z113 Encounter for screening for infections with a predominantly sexual mode of transmission: Secondary | ICD-10-CM | POA: Insufficient documentation

## 2021-01-02 MED ORDER — MEGESTROL ACETATE 40 MG PO TABS
ORAL_TABLET | ORAL | 1 refills | Status: DC
Start: 2021-01-02 — End: 2021-08-21

## 2021-01-02 NOTE — Progress Notes (Signed)
  Subjective:     Patient ID: Chelsea Burgess, female   DOB: 06-10-1996, 25 y.o.   MRN: [id number]  HPI Sister is a 13 year old black female,single, [id number], in complaining of bleeding since IUD placed 10/27/20, liletta.And wants STD testing.  Lab Results  Component Value Date   DIAGPAP  04/13/2020    - Negative for intraepithelial lesion or malignancy (NILM)   HPV DETECTED (A) 06/19/2018    Review of Systems Bleeding with IUD Reviewed past medical,surgical, social and family history. Reviewed medications and allergies.     Objective:   Physical Exam BP 116/80 (BP Location: Right Arm, Patient Position: Sitting, Cuff Size: Normal)   Pulse 72   Ht 5\' 7"  (1.702 m)   Wt 135 lb 3.2 oz (61.3 kg)   LMP 10/27/2020 Comment: Bleeding since IUD placed  Breastfeeding No   BMI 21.18 kg/m  Skin warm and dry.Pelvic: external genitalia is normal in appearance no lesions, vagina: +period blood,urethra has no lesions or masses noted, cervix:smooth and bulbous, +IUD strings at os, CV swab obtained,uterus: normal size, shape and contour, non tender, no masses felt, adnexa: no masses or tenderness noted. Bladder is non tender and no masses felt.   Upstream - 01/02/21 1216       Pregnancy Intention Screening   Does the patient want to become pregnant in the next year? No    Does the patient's partner want to become pregnant in the next year? No    Would the patient like to discuss contraceptive options today? Yes      Contraception Wrap Up   Current Method IUD or IUS    End Method IUD or IUS    Contraception Counseling Provided Yes            Examination chaperoned by 03/05/21 RN     Assessment:     1. IUD check up   2. Vaginal bleeding CV swab sent  Will rx megace to stop bleeding Meds ordered this encounter  Medications   megestrol (MEGACE) 40 MG tablet    Sig: Take 3 x 5 days then 2 x 5 days then 1 daily till bleeding stops    Dispense:  45 tablet    Refill:  1    Order  Specific Question:   Supervising Provider    Answer:   Lawanna Kobus, LUTHER H [[id number]]     3. Screen for STD (sexually transmitted disease) CV swab sent for GC/CHL,trich,BV and yeast     Plan:     Follow up prn

## 2021-01-03 ENCOUNTER — Telehealth: Payer: Self-pay | Admitting: Adult Health

## 2021-01-03 DIAGNOSIS — A749 Chlamydial infection, unspecified: Secondary | ICD-10-CM

## 2021-01-03 LAB — CERVICOVAGINAL ANCILLARY ONLY
Bacterial Vaginitis (gardnerella): POSITIVE — AB
Candida Glabrata: NEGATIVE
Candida Vaginitis: NEGATIVE
Chlamydia: POSITIVE — AB
Comment: NEGATIVE
Comment: NEGATIVE
Comment: NEGATIVE
Comment: NEGATIVE
Comment: NEGATIVE
Comment: NORMAL
Neisseria Gonorrhea: NEGATIVE
Trichomonas: NEGATIVE

## 2021-01-03 MED ORDER — DOXYCYCLINE HYCLATE 100 MG PO TABS: 100.0000 mg | ORAL_TABLET | Freq: Two times a day (BID) | ORAL | 0 refills | Status: DC

## 2021-01-03 MED ORDER — METRONIDAZOLE 500 MG PO TABS: 500.0000 mg | ORAL_TABLET | Freq: Two times a day (BID) | ORAL | 0 refills | Status: DC

## 2021-01-03 NOTE — Telephone Encounter (Signed)
+  BV and chlamydia on CV swab , will rx flagyl and doxycycline, no sex, proof of cure in 4 weeks [facility] sent

## 2021-02-28 ENCOUNTER — Other Ambulatory Visit: Payer: Self-pay

## 2021-04-06 ENCOUNTER — Encounter (HOSPITAL_COMMUNITY): Payer: Self-pay | Admitting: *Deleted

## 2021-04-06 ENCOUNTER — Emergency Department (HOSPITAL_COMMUNITY)
Admission: EM | Admit: 2021-04-06 | Discharge: 2021-04-06 | Discharge status: Against Medical Advice | Payer: Medicaid Other | Attending: Emergency Medicine | Admitting: Emergency Medicine

## 2021-04-06 ENCOUNTER — Other Ambulatory Visit: Payer: Self-pay

## 2021-04-06 DIAGNOSIS — Z5321 Procedure and treatment not carried out due to patient leaving prior to being seen by health care provider: Secondary | ICD-10-CM | POA: Diagnosis not present

## 2021-04-06 DIAGNOSIS — H9202 Otalgia, left ear: Secondary | ICD-10-CM | POA: Insufficient documentation

## 2021-04-06 NOTE — ED Triage Notes (Signed)
Left earache x 3 days, back pain x 2 months

## 2021-05-09 ENCOUNTER — Ambulatory Visit (HOSPITAL_COMMUNITY)
Admission: EM | Admit: 2021-05-09 | Discharge: 2021-05-09 | Disposition: A | Discharge status: Home / Self Care | Payer: Medicaid Other | Attending: Family Medicine | Admitting: Family Medicine

## 2021-05-09 ENCOUNTER — Other Ambulatory Visit: Payer: Self-pay

## 2021-05-09 ENCOUNTER — Encounter (HOSPITAL_COMMUNITY): Payer: Self-pay

## 2021-05-09 DIAGNOSIS — M545 Low back pain, unspecified: Secondary | ICD-10-CM

## 2021-05-09 DIAGNOSIS — R109 Unspecified abdominal pain: Secondary | ICD-10-CM

## 2021-05-09 LAB — POCT URINALYSIS DIPSTICK, ED / UC
Bilirubin Urine: NEGATIVE
Glucose, UA: NEGATIVE mg/dL
Hgb urine dipstick: NEGATIVE
Nitrite: NEGATIVE
Protein, ur: NEGATIVE mg/dL
Specific Gravity, Urine: >=1.03 (ref 1.005–1.030)
Urobilinogen, UA: 0.2 mg/dL (ref 0.0–1.0)
pH: 6.5 (ref 5.0–8.0)

## 2021-05-09 LAB — POC URINE PREG, ED: Preg Test, Ur: NEGATIVE

## 2021-05-09 MED ORDER — PREDNISONE 20 MG PO TABS: 20.0000 mg | ORAL_TABLET | Freq: Every day | ORAL | 0 refills | Status: AC

## 2021-05-09 MED ORDER — NAPROXEN 125 MG/5ML PO SUSP: 500.0000 mg | Freq: Two times a day (BID) | ORAL | 0 refills | Status: DC | PRN

## 2021-05-09 NOTE — ED Provider Notes (Signed)
[facility]    CSN: [id number] Arrival date & time: 05/09/21  2248      History   Chief Complaint Chief Complaint  Patient presents with   Abdominal Pain   Back Pain    Chelsea Burgess is a 25 y.o. female.   Chelsea Patient presents today for evaluation of three months of back pain and 3 days of lower abdominal cramping x 3 days. Last menstrual period ended over one week ago. Abdominal cramping is reproducible with positional movements only. No associated nausea or vomiting. Endorses normal pattern of stools.  Low bilateral back pain persistent x 3 months without known injury. She endorses term pregnancy x 9 months. No prior history of back pain and she has not taken any medication to manage back pain. Denies dysuria, nausea, or vomiting.   Past Medical History:  Diagnosis Date   Herpes    Medical history non-contributory     Patient Active Problem List   Diagnosis Date Noted   Vaginal bleeding 01/02/2021   IUD check up 01/02/2021   Screen for STD (sexually transmitted disease) 01/02/2021   IUD (intrauterine device) in place 09/22/2020   Post term pregnancy over 40 weeks 09/21/2020   Uterine size-date discrepancy in third trimester 09/08/2020   History of herpes simplex infection 05/25/2020   Recurrent UTI (urinary tract infection) complicating pregnancy 03/21/2020   Chlamydia 03/21/2020   Marijuana use 03/16/2020   Rh negative status during pregnancy 03/14/2020   Encounter for supervision of normal pregnancy, antepartum 03/08/2020   Abnormal Pap smear of cervix 06/27/2018    Past Surgical History:  Procedure Laterality Date   NO PAST SURGERIES      OB History     Gravida  3   Para  2   Term  2   Preterm  0   AB  1   Living  2      SAB  1   IAB  0   Ectopic  0   Multiple  0   Live Births  2            Home Medications    Prior to Admission medications   Medication Sig Start Date End Date Taking? Authorizing Provider   naproxen (NAPROSYN) 125 MG/5ML suspension Take 20 mLs (500 mg total) by mouth 2 (two) times daily as needed. 05/09/21  Yes Bing Neighbors, FNP  predniSONE (DELTASONE) 20 MG tablet Take 1 tablet (20 mg total) by mouth daily with breakfast for 5 days. 05/09/21 05/14/21 Yes Bing Neighbors, FNP  doxycycline (VIBRA-TABS) 100 MG tablet Take 1 tablet (100 mg total) by mouth 2 (two) times daily. 01/03/21   Adline Potter, NP  ibuprofen (ADVIL) 100 MG/5ML suspension Take 30 mLs (600 mg total) by mouth every 6 (six) hours. As needed for pain/cramps 09/23/20   Muus, Raphael Gibney, MD  levonorgestrel (LILETTA, 52 MG,) 20.1 MCG/DAY IUD 1 each by Intrauterine route once.    [provider]  megestrol (MEGACE) 40 MG tablet Take 3 x 5 days then 2 x 5 days then 1 daily till bleeding stops 01/02/21   Cyril Mourning A, NP  metroNIDAZOLE (FLAGYL) 500 MG tablet Take 1 tablet (500 mg total) by mouth 2 (two) times daily. 01/03/21   Adline Potter, NP  Prenatal Vit-Fe Fumarate-FA (PRENATAL MULTIVITAMIN) TABS tablet Take 1 tablet by mouth daily at 12 noon.    [provider]    Family History Family History  Problem  Relation Age of Onset   Stroke Mother    Heart attack Mother    Heart attack Father    Stroke Maternal Aunt    Diabetes Maternal Grandmother    Cancer Maternal Grandfather        colon    Social History Social History   Tobacco Use   Smoking status: Never   Smokeless tobacco: Never  Vaping Use   Vaping Use: Never used  Substance Use Topics   Alcohol use: Not Currently   Drug use: Never     Allergies   Patient has no known allergies.   Review of Systems Review of Systems Pertinent negatives listed in Chelsea  Physical Exam Triage Vital Signs ED Triage Vitals  Enc Vitals Group     BP 05/09/21 1944 107/76     Pulse Rate 05/09/21 1944 66     Resp 05/09/21 1944 18     Temp --      Temp src --      SpO2 05/09/21 1944 99 %     Weight --      Height --       Head Circumference --      Peak Flow --      Pain Score 05/09/21 1942 0     Pain Loc --      Pain Edu? --      Excl. in GC? --    No data found.  Updated Vital Signs BP 107/76 (BP Location: Right Arm)   Pulse 66   Resp 18   LMP 05/06/2021 (Exact Date)   SpO2 99%   Visual Acuity Right Eye Distance:   Left Eye Distance:   Bilateral Distance:    Right Eye Near:   Left Eye Near:    Bilateral Near:     Physical Exam Constitutional: Patient appears well-developed and well-nourished. No distress. HENT: Normocephalic, atraumatic, External right and left ear normal. Oropharynx is clear and moist.  Eyes: Conjunctivae and EOM are normal. PERRLA, no scleral icterus. Neck: Normal ROM. Neck supple. No JVD. No tracheal deviation. No thyromegaly. CVS: RRR, S1/S2 +, no murmurs, no gallops, no carotid bruit.  Pulmonary: Effort and breath sounds normal, no stridor, rhonchi, wheezes, rales.  Abdominal: Soft. BS +, no distension, lower right pelvic tenderness-no rebound or guarding.  Musculoskeletal: Normal range of motion.  Spinous process midline, no bulging normal gait Neuro: No focal neurological abnormalities noted on exam Skin: Skin is warm and dry. No rash noted. Not diaphoretic. No erythema. No pallor. Psychiatric: Normal mood and affect. Behavior, judgment, thought content normal.   UC Treatments / Results  Labs (all labs ordered are listed, but only abnormal results are displayed) Labs Reviewed  POCT URINALYSIS DIPSTICK, ED / UC - Abnormal; Notable for the following components:      Result Value   Ketones, ur TRACE (*)    Leukocytes,Ua SMALL (*)    All other components within normal limits  POC URINE PREG, ED    EKG   Radiology No results found.  Procedures Procedures (including critical care time)  Medications Ordered in UC Medications - No data to display  Initial Impression / Assessment and Plan / UC Course  I have reviewed the triage vital signs and the  nursing notes.  Pertinent labs & imaging results that were available during my care of the patient were reviewed by me and considered in my medical decision making (see chart for details).  Physical exam is grossly unremarkable no concern  for an acute abdomen. Acute abdominal cramping of unknown etiology suspect this may be related to postmenstrual pain and or related to possibly recent sexual intercourse.  Encouraged to take naproxen as needed for cramping.  Suspect back pain is related to post pregnancy related back strain treating with a brief course of prednisone and then advised patient to transition to naproxen as needed.  If symptoms persist recommend follow-up with orthopedic specialist. Final Clinical Impressions(s) / UC Diagnoses   Final diagnoses:  Abdominal cramping  Recurrent low back pain   Discharge Instructions   None    ED Prescriptions     Medication Sig Dispense Auth. Provider   predniSONE (DELTASONE) 20 MG tablet Take 1 tablet (20 mg total) by mouth daily with breakfast for 5 days. 5 tablet Bing Neighbors, FNP   naproxen (NAPROSYN) 125 MG/5ML suspension Take 20 mLs (500 mg total) by mouth 2 (two) times daily as needed. 473 mL Bing Neighbors, FNP      PDMP not reviewed this encounter.   Bing Neighbors, FNP 05/11/21 1051

## 2021-05-09 NOTE — ED Triage Notes (Signed)
Pt presents with c/o back pain x 3 months and stomach pain x 3 days.   States she has not taken medicine for relief and states she has been eating and drinking well. States the pain feels like cramping.

## 2021-08-17 ENCOUNTER — Ambulatory Visit: Payer: Medicaid Other | Attending: Family Medicine | Admitting: Family Medicine

## 2021-08-21 ENCOUNTER — Encounter: Payer: Self-pay | Admitting: Physician Assistant

## 2021-08-21 ENCOUNTER — Other Ambulatory Visit (HOSPITAL_COMMUNITY)
Admission: RE | Admit: 2021-08-21 | Discharge: 2021-08-21 | Disposition: A | Discharge status: Home / Self Care | Payer: Medicaid Other | Source: Ambulatory Visit | Attending: Physician Assistant | Admitting: Physician Assistant

## 2021-08-21 ENCOUNTER — Ambulatory Visit (INDEPENDENT_AMBULATORY_CARE_PROVIDER_SITE_OTHER): Payer: Medicaid Other

## 2021-08-21 ENCOUNTER — Other Ambulatory Visit: Payer: Self-pay

## 2021-08-21 ENCOUNTER — Other Ambulatory Visit: Payer: Self-pay | Admitting: Physician Assistant

## 2021-08-21 ENCOUNTER — Ambulatory Visit (INDEPENDENT_AMBULATORY_CARE_PROVIDER_SITE_OTHER): Payer: Medicaid Other | Admitting: Physician Assistant

## 2021-08-21 VITALS — BP 112/67 | HR 70 | Temp 98.3°F | Resp 18 | Ht 67.0 in | Wt 130.0 lb

## 2021-08-21 DIAGNOSIS — M546 Pain in thoracic spine: Secondary | ICD-10-CM | POA: Diagnosis not present

## 2021-08-21 DIAGNOSIS — M545 Low back pain, unspecified: Secondary | ICD-10-CM | POA: Diagnosis not present

## 2021-08-21 DIAGNOSIS — Z6820 Body mass index (BMI) 20.0-20.9, adult: Secondary | ICD-10-CM | POA: Diagnosis not present

## 2021-08-21 DIAGNOSIS — B9689 Other specified bacterial agents as the cause of diseases classified elsewhere: Secondary | ICD-10-CM

## 2021-08-21 DIAGNOSIS — G8929 Other chronic pain: Secondary | ICD-10-CM | POA: Diagnosis not present

## 2021-08-21 DIAGNOSIS — Z113 Encounter for screening for infections with a predominantly sexual mode of transmission: Secondary | ICD-10-CM

## 2021-08-21 MED ORDER — CYCLOBENZAPRINE HCL 10 MG PO TABS: 10.0000 mg | ORAL_TABLET | Freq: Three times a day (TID) | ORAL | 0 refills | Status: DC | PRN

## 2021-08-21 NOTE — Progress Notes (Signed)
Patient has not eaten or taken medication today. Patient reports constant back pain since July 22 in the mid and lower back. Patient reports having an epidural in Hx

## 2021-08-21 NOTE — Progress Notes (Signed)
New Patient Office Visit  Subjective:  Patient ID: Chelsea Burgess, female    DOB: [birth year]  Age: 26 y.o. MRN: [id number]  CC:  Chief Complaint  Patient presents with   Hospitalization Follow-up    Back Pain    HPI [name] states that she has been having back pain since July 2022, states pain is constant, states that it is in her middle and lower back along her spine.  Denies radiation.  Describes it as aching, "like sore muscles".  Denies injury or trauma, does endorse that she had an epidural on September 21, 2020.  States that she has been doing stretching, has had deep tissue massages, has been using Tylenol, ibuprofen and naproxen without relief.  Does request STI testing, denies any known exposure or STI symptoms.   Past Medical History:  Diagnosis Date   Herpes    Medical history non-contributory     Past Surgical History:  Procedure Laterality Date   NO PAST SURGERIES      Family History  Problem Relation Age of Onset   Stroke Mother    Heart attack Mother    Heart attack Father    Stroke Maternal Aunt    Diabetes Maternal Grandmother    Cancer Maternal Grandfather        colon    Social History   Socioeconomic History   Marital status: Single    Spouse name: Not on file   Number of children: 1   Years of education: Not on file   Highest education level: Not on file  Occupational History   Not on file  Tobacco Use   Smoking status: Never   Smokeless tobacco: Never  Vaping Use   Vaping Use: Never used  Substance and Sexual Activity   Alcohol use: Not Currently   Drug use: Never   Sexual activity: Yes    Birth control/protection: I.U.D.  Other Topics Concern   Not on file  Social History Narrative   ** Merged History Encounter **       ** Merged History Encounter **       Social Determinants of Health   Financial Resource Strain: Not on file  Food Insecurity: Not on file  Transportation Needs: Not on file  Physical  Activity: Not on file  Stress: Not on file  Social Connections: Not on file  Intimate Partner Violence: Not on file    ROS Review of Systems  Constitutional:  Negative for chills and fever.  HENT: Negative.    Eyes: Negative.   Respiratory:  Negative for shortness of breath.   Cardiovascular:  Negative for chest pain.  Gastrointestinal: Negative.   Endocrine: Negative.   Genitourinary:  Negative for decreased urine volume, dysuria, genital sores and vaginal discharge.  Musculoskeletal:  Positive for arthralgias, back pain and myalgias.  Skin: Negative.   Allergic/Immunologic: Negative.   Neurological:  Negative for headaches.  Hematological: Negative.   Psychiatric/Behavioral: Negative.     Objective:   Today's Vitals: BP 112/67 (BP Location: Left Arm, Patient Position: Sitting, Cuff Size: Normal)    Pulse 70    Temp 98.3 F (36.8 C) (Oral)    Resp 18    Ht 5\' 7"  (1.702 m)    Wt 130 lb (59 kg)    LMP 08/17/2021    SpO2 99%    BMI 20.36 kg/m   Physical Exam Vitals and nursing note reviewed.  Constitutional:      Appearance: Normal appearance.  HENT:  Head: Normocephalic and atraumatic.     Right Ear: External ear normal.     Left Ear: External ear normal.     Nose: Nose normal.     Mouth/Throat:     Mouth: Mucous membranes are moist.     Pharynx: Oropharynx is clear.  Eyes:     Extraocular Movements: Extraocular movements intact.     Conjunctiva/sclera: Conjunctivae normal.     Pupils: Pupils are equal, round, and reactive to light.  Cardiovascular:     Rate and Rhythm: Normal rate and regular rhythm.     Pulses: Normal pulses.     Heart sounds: Normal heart sounds.  Pulmonary:     Effort: Pulmonary effort is normal.     Breath sounds: Normal breath sounds.  Musculoskeletal:     Cervical back: Normal, normal range of motion and neck supple. No swelling. Normal range of motion.     Thoracic back: No swelling or tenderness. Decreased range of motion.     Lumbar  back: No swelling or tenderness. Decreased range of motion.     Comments: Pain elicited with range of motion testing  Skin:    General: Skin is warm and dry.  Neurological:     General: No focal deficit present.     Mental Status: She is alert and oriented to person, place, and time.  Psychiatric:        Mood and Affect: Mood normal.        Behavior: Behavior normal.        Thought Content: Thought content normal.        Judgment: Judgment normal.    Assessment & Plan:   Problem List Items Addressed This Visit       Other   Screen for STD (sexually transmitted disease)   Relevant Orders   Cervicovaginal ancillary only   Other Visit Diagnoses     Chronic bilateral low back pain without sciatica    -  Primary   Relevant Medications   cyclobenzaprine (FLEXERIL) 10 MG tablet   Other Relevant Orders   DG Lumbar Spine Complete   Chronic bilateral thoracic back pain       Relevant Medications   cyclobenzaprine (FLEXERIL) 10 MG tablet   Other Relevant Orders   DG Thoracic Spine 2 View       Outpatient Encounter Medications as of 08/21/2021  Medication Sig   cyclobenzaprine (FLEXERIL) 10 MG tablet Take 1 tablet (10 mg total) by mouth 3 (three) times daily as needed for muscle spasms.   ibuprofen (ADVIL) 100 MG/5ML suspension Take 30 mLs (600 mg total) by mouth every 6 (six) hours. As needed for pain/cramps   levonorgestrel (LILETTA, 52 MG,) 20.1 MCG/DAY IUD 1 each by Intrauterine route once.   [DISCONTINUED] doxycycline (VIBRA-TABS) 100 MG tablet Take 1 tablet (100 mg total) by mouth 2 (two) times daily.   [DISCONTINUED] megestrol (MEGACE) 40 MG tablet Take 3 x 5 days then 2 x 5 days then 1 daily till bleeding stops   [DISCONTINUED] metroNIDAZOLE (FLAGYL) 500 MG tablet Take 1 tablet (500 mg total) by mouth 2 (two) times daily.   [DISCONTINUED] naproxen (NAPROSYN) 125 MG/5ML suspension Take 20 mLs (500 mg total) by mouth 2 (two) times daily as needed.   [DISCONTINUED] Prenatal  Vit-Fe Fumarate-FA (PRENATAL MULTIVITAMIN) TABS tablet Take 1 tablet by mouth daily at 12 noon.   No facility-administered encounter medications on file as of 08/21/2021.  1. Chronic bilateral low back pain without sciatica Trial Flexeril,  patient education given on supportive care, red flags given for prompt reevaluation.  Patient given appointment to establish care Primary Care at [facility]. - DG Lumbar Spine Complete - cyclobenzaprine (FLEXERIL) 10 MG tablet; Take 1 tablet (10 mg total) by mouth 3 (three) times daily as needed for muscle spasms.  Dispense: 30 tablet; Refill: 0  2. Chronic bilateral thoracic back pain  - DG Thoracic Spine 2 View  3. Screen for STD (sexually transmitted disease) Patient declines testing for HIV or syphilis. - Cervicovaginal ancillary only   I have reviewed the patient's medical history (PMH, PSH, Social History, Family History, Medications, and allergies) , and have been updated if relevant. I spent 20 minutes reviewing chart and  face to face time with patient.    Follow-up: Return for needs PCP here  1-2 months .   Kasandra Knudsen Mayers, PA-C

## 2021-08-21 NOTE — Patient Instructions (Signed)
To help with your back pain, you can use Flexeril every 8 hours as needed.  I do encourage you to continue using ibuprofen, continue stretching, staying very well-hydrated.  We will call you with the results of today's lab and x-rays.  Roney Jaffe, PA-C Physician Assistant [facility] [email or web address]   Chronic Back Pain When back pain lasts longer than 3 months, it is called chronic back pain. The cause of your back pain may not be known. Some common causes include: Wear and tear (degenerative disease) of the bones, ligaments, or disks in your back. Inflammation and stiffness in your back (arthritis). People who have chronic back pain often go through certain periods in which the pain is more intense (flare-ups). Many people can learn to manage the pain with home care. Follow these instructions at home: Pay attention to any changes in your symptoms. Take these actions to help with your pain: Managing pain and stiffness   If directed, apply ice to the painful area. Your health care provider may recommend applying ice during the first 24-48 hours after a flare-up begins. To do this: Put ice in a plastic bag. Place a towel between your skin and the bag. Leave the ice on for 20 minutes, 2-3 times per day. If directed, apply heat to the affected area as often as told by your health care provider. Use the heat source that your health care provider recommends, such as a moist heat pack or a heating pad. Place a towel between your skin and the heat source. Leave the heat on for 20-30 minutes. Remove the heat if your skin turns bright red. This is especially important if you are unable to feel pain, heat, or cold. You may have a greater risk of getting burned. Try soaking in a warm tub. Activity  Avoid bending and other activities that make the problem worse. Maintain a proper position when standing or sitting: When standing, keep  your upper back and neck straight, with your shoulders pulled back. Avoid slouching. When sitting, keep your back straight and relax your shoulders. Do not round your shoulders or pull them backward. Do not sit or stand in one place for long periods of time. Take brief periods of rest throughout the day. This will reduce your pain. Resting in a lying or standing position is usually better than sitting to rest. When you are resting for longer periods, mix in some mild activity or stretching between periods of rest. This will help to prevent stiffness and pain. Get regular exercise. Ask your health care provider what activities are safe for you. Do not lift anything that is heavier than 10 lb (4.5 kg), or the limit that you are told, until your health care provider says that it is safe. Always use proper lifting technique, which includes: Bending your knees. Keeping the load close to your body. Avoiding twisting. Sleep on a firm mattress in a comfortable position. Try lying on your side with your knees slightly bent. If you lie on your back, put a pillow under your knees. Medicines Treatment may include medicines for pain and inflammation taken by mouth or applied to the skin, prescription pain medicine, or muscle relaxants. Take over-the-counter and prescription medicines only as told by your health care provider. Ask your health care provider if the medicine prescribed to you: Requires you to avoid driving or using machinery. Can cause constipation. You may need to take these actions to prevent or treat constipation: Drink enough fluid to  keep your urine pale yellow. Take over-the-counter or prescription medicines. Eat foods that are high in fiber, such as beans, whole grains, and fresh fruits and vegetables. Limit foods that are high in fat and processed sugars, such as fried or sweet foods. General instructions Do not use any products that contain nicotine or tobacco, such as cigarettes,  e-cigarettes, and chewing tobacco. If you need help quitting, ask your health care provider. Keep all follow-up visits as told by your health care provider. This is important. Contact a health care provider if: You have pain that is not relieved with rest or medicine. Your pain gets worse, or you have new pain. You have a high fever. You have rapid weight loss. You have trouble doing your normal activities. Get help right away if: You have weakness or numbness in one or both of your legs or feet. You have trouble controlling your bladder or your bowels. You have severe back pain and have any of the following: Nausea or vomiting. Pain in your abdomen. Shortness of breath or you faint. Summary Chronic back pain is back pain that lasts longer than 3 months. When a flare-up begins, apply ice to the painful area for the first 24-48 hours. Apply a moist heat pad or use a heating pad on the painful area as directed by your health care provider. When you are resting for longer periods, mix in some mild activity or stretching between periods of rest. This will help to prevent stiffness and pain. This information is not intended to replace advice given to you by your health care provider. Make sure you discuss any questions you have with your health care provider. Document Revised: 07/22/2019 Document Reviewed: 07/22/2019 Elsevier Patient Education  2022 [employer].

## 2021-08-22 DIAGNOSIS — M545 Low back pain, unspecified: Secondary | ICD-10-CM | POA: Insufficient documentation

## 2021-08-22 DIAGNOSIS — G8929 Other chronic pain: Secondary | ICD-10-CM | POA: Insufficient documentation

## 2021-08-23 ENCOUNTER — Encounter: Payer: Self-pay | Admitting: Physician Assistant

## 2021-08-24 NOTE — Progress Notes (Signed)
Patient would like referral due to medication not providing relief.

## 2021-08-25 LAB — MOLECULAR ANCILLARY ONLY
Bacterial Vaginitis (gardnerella): POSITIVE — AB
Candida Glabrata: NEGATIVE
Candida Vaginitis: POSITIVE — AB
Chlamydia: POSITIVE — AB
Comment: NEGATIVE
Comment: NEGATIVE
Comment: NEGATIVE
Comment: NEGATIVE
Comment: NEGATIVE
Comment: NORMAL
Neisseria Gonorrhea: NEGATIVE
Trichomonas: NEGATIVE

## 2021-08-29 ENCOUNTER — Telehealth: Payer: Self-pay | Admitting: Physician Assistant

## 2021-08-29 ENCOUNTER — Other Ambulatory Visit: Payer: Self-pay | Admitting: Physician Assistant

## 2021-08-29 DIAGNOSIS — A749 Chlamydial infection, unspecified: Secondary | ICD-10-CM

## 2021-08-29 DIAGNOSIS — B379 Candidiasis, unspecified: Secondary | ICD-10-CM

## 2021-08-29 DIAGNOSIS — G8929 Other chronic pain: Secondary | ICD-10-CM

## 2021-08-29 MED ORDER — DOXYCYCLINE HYCLATE 100 MG PO CAPS: 100.0000 mg | ORAL_CAPSULE | Freq: Two times a day (BID) | ORAL | 0 refills | Status: DC

## 2021-08-29 MED ORDER — FLUCONAZOLE 150 MG PO TABS: 150.0000 mg | ORAL_TABLET | Freq: Once | ORAL | 0 refills | Status: AC

## 2021-08-29 MED ORDER — METRONIDAZOLE 500 MG PO TABS: 500.0000 mg | ORAL_TABLET | Freq: Two times a day (BID) | ORAL | 0 refills | Status: AC

## 2021-08-29 NOTE — Telephone Encounter (Signed)
Per Kenney Houseman with cytology, the system has a glitch that is not crossing results over into Epic. Results are being faxed in the interim. Patient was positive for yeast and chlamydia per Cytology rep

## 2021-08-29 NOTE — Telephone Encounter (Signed)
Medical Assistant left message on patient's home and cell voicemail. ?Voicemail states to give a call back to [place] with [facility] at [phone]. ?Patient viewed results via mychart and should follow the providers instructions. ?

## 2021-08-29 NOTE — Telephone Encounter (Signed)
Pt called and said that the medication that you gave her for her back is still not working. She said that she thought you were going to put a referral in for her to a back doctor, and she said that she had not received her lab results either. Please contact pt. ?

## 2021-08-29 NOTE — Progress Notes (Signed)
Patient positive for chlamydia and yeast infection.  Lab results were given verbally, issue at lab for not resulting promptly.  Prescription sent, patient notified ? ?Roney Jaffe, PA-C ?Physician Assistant ?[facility] ?[email or web address] ? ?

## 2021-08-29 NOTE — Progress Notes (Signed)
Ref sent

## 2021-08-29 NOTE — Telephone Encounter (Signed)
-----   Message from Roney Jaffe, PA-C sent at 08/29/2021  3:41 PM EST ----- ?Patient previously informed of positive test of chlamydia and yeast infection, patient is also positive for bacterial vaginitis, patient will also take metronidazole twice daily for 7 days.  Prescription sent to pharmacy. ?

## 2021-08-29 NOTE — Addendum Note (Signed)
Addended by: Roney Jaffe on: 08/29/2021 03:41 PM   Modules accepted: Orders

## 2021-08-30 ENCOUNTER — Ambulatory Visit (INDEPENDENT_AMBULATORY_CARE_PROVIDER_SITE_OTHER): Payer: Medicaid Other | Admitting: Surgery

## 2021-08-30 ENCOUNTER — Other Ambulatory Visit: Payer: Self-pay

## 2021-08-30 ENCOUNTER — Encounter: Payer: Self-pay | Admitting: Surgery

## 2021-08-30 VITALS — BP 103/68 | HR 108 | Ht 68.0 in | Wt 131.6 lb

## 2021-08-30 DIAGNOSIS — M545 Low back pain, unspecified: Secondary | ICD-10-CM | POA: Diagnosis not present

## 2021-08-30 DIAGNOSIS — G8929 Other chronic pain: Secondary | ICD-10-CM | POA: Diagnosis not present

## 2021-08-30 MED ORDER — METHYLPREDNISOLONE 4 MG PO TABS: ORAL_TABLET | ORAL | 0 refills | Status: DC

## 2021-08-30 NOTE — Progress Notes (Signed)
? ?Office Visit Note ?  ?Patient: Chelsea Burgess           ?Date of Birth: 03-31-1996           ?MRN: [id number] ?Visit Date: 08/30/2021 ?             ?Requested by: Mayers, Loraine Grip, PA-C ?[place] ?[address] ?[place],  [place] [id number] ?PCP: Toula Moos, MD ? ? ?Assessment & Plan: ?Visit Diagnoses:  ?1. Chronic bilateral low back pain without sciatica   ? ? ?Plan: With patient's ongoing back pain for several months we will continue conservative treatment with Medrol Dosepak 6-day taper to be taken as directed.  I will also have patient go to formal PT for a few weeks.  Blood work was also drawn to check a CBC and arthritis panel.  Follow with Dr. Lorin Mercy in 4 weeks for recheck.  He will make decision at that time as whether or not patient needs lumbar MRI.  My exam today was pretty benign. ? ?Follow-Up Instructions: Return in about 4 weeks (around 09/27/2021) for with dr yates for recheck lumbar and review labs.  ? ?Orders:  ?Orders Placed This Encounter  ?Procedures  ? CBC  ? Antinuclear Antib (ANA)  ? Uric acid  ? Rheumatoid Factor  ? Sed Rate (ESR)  ? Ambulatory referral to Physical Therapy  ? ?Meds ordered this encounter  ?Medications  ? methylPREDNISolone (MEDROL) 4 MG tablet  ?  Sig: 6 day taper to be taken as directed.  ?  Dispense:  21 tablet  ?  Refill:  0  ? ? ? ? Procedures: ?No procedures performed ? ? ?Clinical Data: ?No additional findings. ? ? ?Subjective: ?Chief Complaint  ?Patient presents with  ? Lower Back - Pain  ? ? ?HPI ?26 year old black female who is new patient to clinic comes in today with complaints of low back pain.  She states that pain started July 2022.  No injury.  States that she has pain across her low back with sitting and movement at times.  No radicular component.  States that she did go to [facility] urgent care November 2022.  I reviewed that note and patient was prescribed prednisone and naproxen.  She was also seen by her primary care provider August 21, 2021.  She has  also tried muscle relaxers along with Aleve and ibuprofen.  She rates her pain 8-1/2 out of 10.  Denies dysuria. ?Review of Systems ?No c/o cardiac, pulm, gi, gu issues.  ? ?Objective: ?Vital Signs: BP 103/68 (BP Location: Left Arm, Patient Position: Sitting, Cuff Size: Large)   Pulse (!) 108   Ht '5\' 8"'  (1.727 m)   Wt 131 lb 9.6 oz (59.7 kg)   LMP 08/17/2021   BMI 20.01 kg/m?  ? ?Physical Exam ?HENT:  ?   Head: Normocephalic.  ?   Nose: Nose normal.  ?Eyes:  ?   Extraocular Movements: Extraocular movements intact.  ?Pulmonary:  ?   Effort: No respiratory distress.  ?Musculoskeletal:  ?   Comments: Gait is normal.  No thoracolumbar paraspinal tenderness/spasm.  No pain with lumbar extension.  Bilateral SI joints nontender.  Negative logroll bilateral hips.  Negative straight leg raise.  No focal motor deficits.  ?Neurological:  ?   Mental Status: She is alert and oriented to person, place, and time.  ?Psychiatric:     ?   Mood and Affect: Mood normal.  ? ? ?Ortho Exam ? ?Specialty Comments:  ?No specialty comments  available. ? ?Imaging: ?No results found. ? ? ?PMFS History: ?Patient Active Problem List  ? Diagnosis Date Noted  ? Chronic bilateral low back pain without sciatica 08/22/2021  ? Chronic bilateral thoracic back pain 08/22/2021  ? Vaginal bleeding 01/02/2021  ? IUD check up 01/02/2021  ? Screen for STD (sexually transmitted disease) 01/02/2021  ? IUD (intrauterine device) in place 09/22/2020  ? Post term pregnancy over 40 weeks 09/21/2020  ? Uterine size-date discrepancy in third trimester 09/08/2020  ? History of herpes simplex infection 05/25/2020  ? Recurrent UTI (urinary tract infection) complicating pregnancy 56/72/0919  ? Chlamydia 03/21/2020  ? Marijuana use 03/16/2020  ? Rh negative status during pregnancy 03/14/2020  ? Encounter for supervision of normal pregnancy, antepartum 03/08/2020  ? Abnormal Pap smear of cervix 06/27/2018  ? ?Past Medical History:  ?Diagnosis Date  ? Herpes   ? Medical  history non-contributory   ?  ?Family History  ?Problem Relation Age of Onset  ? Stroke Mother   ? Heart attack Mother   ? Heart attack Father   ? Stroke Maternal Aunt   ? Diabetes Maternal Grandmother   ? Cancer Maternal Grandfather   ?     colon  ?  ?Past Surgical History:  ?Procedure Laterality Date  ? NO PAST SURGERIES    ? ?Social History  ? ?Occupational History  ? Not on file  ?Tobacco Use  ? Smoking status: Never  ? Smokeless tobacco: Never  ?Vaping Use  ? Vaping Use: Never used  ?Substance and Sexual Activity  ? Alcohol use: Not Currently  ? Drug use: Never  ? Sexual activity: Yes  ?  Birth control/protection: I.U.D.  ? ? ? ? ? ? ?

## 2021-09-01 LAB — URIC ACID: Uric Acid, Serum: 4 mg/dL (ref 2.5–7.0)

## 2021-09-01 LAB — CBC
HCT: 44.3 % (ref 35.0–45.0)
Hemoglobin: 14.3 g/dL (ref 11.7–15.5)
MCH: 29.1 pg (ref 27.0–33.0)
MCHC: 32.3 g/dL (ref 32.0–36.0)
MCV: 90 fL (ref 80.0–100.0)
MPV: 11.5 fL (ref 7.5–12.5)
Platelets: 234 10*3/uL (ref 140–400)
RBC: 4.92 10*6/uL (ref 3.80–5.10)
RDW: 12.2 % (ref 11.0–15.0)
WBC: 5.4 10*3/uL (ref 3.8–10.8)

## 2021-09-01 LAB — SEDIMENTATION RATE: Sed Rate: 6 mm/h (ref 0–20)

## 2021-09-01 LAB — RHEUMATOID FACTOR: Rheumatoid fact SerPl-aCnc: <14 IU/mL (ref <14)

## 2021-09-01 LAB — ANA: Anti Nuclear Antibody (ANA): NEGATIVE

## 2021-09-12 ENCOUNTER — Ambulatory Visit: Payer: Medicaid Other | Attending: Surgery

## 2021-09-12 ENCOUNTER — Other Ambulatory Visit: Payer: Self-pay

## 2021-09-12 DIAGNOSIS — M6281 Muscle weakness (generalized): Secondary | ICD-10-CM | POA: Diagnosis present

## 2021-09-12 DIAGNOSIS — G8929 Other chronic pain: Secondary | ICD-10-CM | POA: Insufficient documentation

## 2021-09-12 DIAGNOSIS — M545 Low back pain, unspecified: Secondary | ICD-10-CM | POA: Diagnosis present

## 2021-09-12 NOTE — Therapy (Signed)
?OUTPATIENT PHYSICAL THERAPY THORACOLUMBAR EVALUATION ? ? ?Patient Name: Chelsea Burgess ?MRN: [id number] ?DOB:June 22, 1996, 26 y.o., female ?Today's Date: 09/12/2021 ? ? PT End of Session - 09/12/21 1316   ? ? Visit Number 1   ? Number of Visits 4   ? Date for PT Re-Evaluation 11/07/21   ? Authorization Type UHC MCD   ? Progress Note Due on Visit 8   ? PT Start Time 1315   ? PT Stop Time 1400   ? PT Time Calculation (min) 45 min   ? Activity Tolerance Patient tolerated treatment well   ? Behavior During Therapy [facility] for tasks assessed/performed   ? ?  ?  ? ?  ? ? ?Past Medical History:  ?Diagnosis Date  ? Herpes   ? Medical history non-contributory   ? ?Past Surgical History:  ?Procedure Laterality Date  ? NO PAST SURGERIES    ? ?Patient Active Problem List  ? Diagnosis Date Noted  ? Chronic bilateral low back pain without sciatica 08/22/2021  ? Chronic bilateral thoracic back pain 08/22/2021  ? Vaginal bleeding 01/02/2021  ? IUD check up 01/02/2021  ? Screen for STD (sexually transmitted disease) 01/02/2021  ? IUD (intrauterine device) in place 09/22/2020  ? Post term pregnancy over 40 weeks 09/21/2020  ? Uterine size-date discrepancy in third trimester 09/08/2020  ? History of herpes simplex infection 05/25/2020  ? Recurrent UTI (urinary tract infection) complicating pregnancy 03/21/2020  ? Chlamydia 03/21/2020  ? Marijuana use 03/16/2020  ? Rh negative status during pregnancy 03/14/2020  ? Encounter for supervision of normal pregnancy, antepartum 03/08/2020  ? Abnormal Pap smear of cervix 06/27/2018  ? ? ?PCP: Conrad [place], MD ? ?REFERRING PROVIDER: Naida Sleight, PA-C ? ?REFERRING DIAG: M54.50,G89.29 (ICD-10-CM) - Chronic bilateral low back pain without sciatica  ? ?THERAPY DIAG: Chronic bilateral low back pain without sciatica  ? ? ?ONSET DATE: July 2022  ? ?SUBJECTIVE:                                                                                                                                                                                           ? ?SUBJECTIVE STATEMENT: ?Denies injury or trauma, does endorse that she had an epidural on September 21, 2020. ?PERTINENT HISTORY:  ?26 year old black female who is new patient to clinic comes in today with complaints of low back pain.  She states that pain started July 2022.  No injury.  States that she has pain across her low back with sitting and movement at times.  No radicular component.  States that she did go to [facility] urgent care November 2022.  I  reviewed that note and patient was prescribed prednisone and naproxen.  She was also seen by her primary care provider August 21, 2021.  She has also tried muscle relaxers along with Aleve and ibuprofen.  She rates her pain 8-1/2 out of 10.  Denies dysuria. ?Review of Systems ?No c/o cardiac, pulm, gi, gu issues.  ? ?PAIN:  ?Are you having pain? Yes: NPRS scale: 5/10 ?Pain location: low back ?Pain description: ache ?Aggravating factors: sitting upright, postural correction  ?Relieving factors: meds,  ? ? ?PRECAUTIONS: None ? ?WEIGHT BEARING RESTRICTIONS No ? ?FALLS:  ?Has patient fallen in last 6 months? No, Number of falls: 0 ? ?LIVING ENVIRONMENT: ?Lives with: lives with their family ?Lives in: House/apartment ? ?OCCUPATION: call center ? ?PLOF: Independent ? ?PATIENT GOALS To reduce my pain  ? ? ?OBJECTIVE:  ? ?DIAGNOSTIC FINDINGS:  ?FINDINGS: ?There are 5 non-rib-bearing lumbar vertebra. The alignment is ?maintained. Vertebral body heights are normal. There is no ?listhesis. The posterior elements are intact. Disc spaces are ?preserved. No fracture, focal lesion or pars defect. Incidental note ?of non fusion posterior elements of L5. Sacroiliac joints are ?symmetric and normal. IUD in the pelvis. ?  ?IMPRESSION: ?1. Negative radiographs of the lumbar spine. ?2. Incidental note of non fusion posterior elements of L5, normal ?variant. ?  ?  ?Electronically Signed ?  By: Narda RutherfordMelanie  Sanford M.D. ?  On: 08/22/2021  12:56 ? ?PATIENT SURVEYS:  ?Modified Oswestry 11%  ? ?SCREENING FOR RED FLAGS: ?Bowel or bladder incontinence: No ? ?COGNITION: ? Overall cognitive status: Within functional limits for tasks assessed   ?  ?SENSATION: ?Not tested ? ?MUSCLE LENGTH: ?Hamstrings: Right 90 deg; Left 90 deg ? ? ?POSTURE:  ?Increased lordosis ? ?PALPATION: ?Tenderness at thoraco-lumbar junction centrally wit discomfort with PA pressure ? ?LUMBAR ROM:  ? ?Active  A/PROM  ?09/12/2021  ?Flexion 100%  ?Extension 100%  ?Right lateral flexion 100%  ?Left lateral flexion 100%  ?Right rotation 75%  ?Left rotation 100$  ? (Blank rows = not tested) ? ?LE ROM: WNL throughout  ? ?Active  Right ?09/12/2021 Left ?09/12/2021  ?Hip flexion    ?Hip extension    ?Hip abduction    ?Hip adduction    ?Hip internal rotation    ?Hip external rotation    ?Knee flexion    ?Knee extension    ?Ankle dorsiflexion    ?Ankle plantarflexion    ?Ankle inversion    ?Ankle eversion    ?  ? ?LE MMT: WNL throughout ? ?MMT Right ?09/12/2021 Left ?09/12/2021  ?Hip flexion    ?Hip extension    ?Hip abduction    ?Hip adduction    ?Hip internal rotation    ?Hip external rotation    ?Knee flexion    ?Knee extension    ?Ankle dorsiflexion    ?Ankle plantarflexion    ?Ankle inversion    ?Ankle eversion    ? (Blank rows = not tested) ? ?LUMBAR SPECIAL TESTS:  ?Prone instability test: Negative, Straight leg raise test: Negative, Slump test: Negative, and FABER test: Negative ? ? ?GAIT: ?Distance walked: 7175ft x2 ?Assistive device utilized: None ?Level of assistance: Complete Independence ? ? ? ? ?TODAY'S TREATMENT  ?Eval and HEP ? ? ?PATIENT EDUCATION:  ?Education details: Discussed eval findings, rehab rationale and POC and patient is in agreement  ?Person educated: Patient ?Education method: Explanation, Demonstration, and Handouts ?Education comprehension: verbalized understanding, returned demonstration, and needs further education ? ? ?HOME EXERCISE PROGRAM: ?Access Code:  [email or web address] ?URL: [email or web address] ?Date: 09/12/2021 ?Prepared by: Gustavus Bryant ? ?Exercises ?Sidelying Open Book Thoracic Lumbar Rotation and Extension - 2 x daily - 7 x weekly - 1 sets - 10 reps - 3s hold ? ? ?ASSESSMENT: ? ?CLINICAL IMPRESSION: ?Patient is a 26 y.o. female who was seen today for physical therapy evaluation and treatment for chronic low back pain w/o radicular symptoms. Pain and symptoms localized to thoraco-lumbar junction with discomfort elicited with spring testing, no rib dysfunction noted, no other bony abnormalities observed.  BLE strength and ROM WNL, AROM through spine limited only in seated R rotation. ? ? ?OBJECTIVE IMPAIRMENTS decreased knowledge of condition, decreased mobility, decreased ROM, postural dysfunction, and pain.  ? ? ?PERSONAL FACTORS Age, Fitness, and Time since onset of injury/illness/exacerbation are also affecting patient's functional outcome.  ? ? ?REHAB POTENTIAL: Good ? ?CLINICAL DECISION MAKING: Stable/uncomplicated ? ?EVALUATION COMPLEXITY: Low ? ? ?GOALS: ?Goals reviewed with patient? Yes ? ?SHORT TERM GOALS: STGs=LTGs ? ?LONG TERM GOALS: Target date: 10/03/2021 ? ?Decrease worst pain to 2/10 ?Baseline: 5/10 at worst ?Goal status: INITIAL ? ?2.  Increase R rotation to 90% actively ?Baseline: 75% AROM R rotation ?Goal status: INITIAL ? ?3.  Patient to demonstrate independence in HEP  ?Baseline: [email or web address] ?Goal status: INITIAL ? ?4.  No symptoms with spring testing ?Baseline: discomfort with spring testing ?Goal status: INITIAL ? ? ?PLAN: ?PT FREQUENCY: 1x/week ? ?PT DURATION: 3 weeks ? ?PLANNED INTERVENTIONS: Therapeutic exercises, Therapeutic activity, Neuromuscular re-education, Balance training, Gait training, Patient/Family education, Joint manipulation, Joint mobilization, Dry Needling, and Manual therapy. ? ?PLAN FOR NEXT SESSION: assess HEP benefit, core strengthening, thoracic mobility, postural training ? ? ?Hildred Laser,  PT ?09/12/2021, 3:57 PM  ? ?Check all possible CPT codes: 86381- Therapeutic Exercise, [id number]- Neuro Re-education, [phone] - Gait Training, [phone] - Manual Therapy, and 97530 - Therapeutic Activities    ? ?If treatment provided at initial e

## 2021-09-19 ENCOUNTER — Ambulatory Visit: Payer: Medicaid Other

## 2021-09-19 ENCOUNTER — Other Ambulatory Visit: Payer: Self-pay

## 2021-09-19 DIAGNOSIS — M6281 Muscle weakness (generalized): Secondary | ICD-10-CM

## 2021-09-19 DIAGNOSIS — M545 Low back pain, unspecified: Secondary | ICD-10-CM | POA: Diagnosis not present

## 2021-09-19 NOTE — Therapy (Signed)
?OUTPATIENT PHYSICAL THERAPY TREATMENT NOTE ? ? ?Patient Name: Chelsea Burgess ?MRN: [id number] ?DOB:[birth year], 26 y.o., female ?Today's Date: 09/19/2021 ? ?PCP: Conrad Newport, MD ?REFERRING PROVIDER: Conrad Clint, MD ? ? PT End of Session - 09/19/21 1322   ? ? Visit Number 2   ? Number of Visits 4   ? Date for PT Re-Evaluation 11/07/21   ? Authorization Type UHC MCD   ? Progress Note Due on Visit 8   ? PT Start Time 1320   ? PT Stop Time 1400   ? PT Time Calculation (min) 40 min   ? Activity Tolerance Patient tolerated treatment well   ? Behavior During Therapy [facility] for tasks assessed/performed   ? ?  ?  ? ?  ? ? ?Past Medical History:  ?Diagnosis Date  ? Herpes   ? Medical history non-contributory   ? ?Past Surgical History:  ?Procedure Laterality Date  ? NO PAST SURGERIES    ? ?Patient Active Problem List  ? Diagnosis Date Noted  ? Chronic bilateral low back pain without sciatica 08/22/2021  ? Chronic bilateral thoracic back pain 08/22/2021  ? Vaginal bleeding 01/02/2021  ? IUD check up 01/02/2021  ? Screen for STD (sexually transmitted disease) 01/02/2021  ? IUD (intrauterine device) in place 09/22/2020  ? Post term pregnancy over 40 weeks 09/21/2020  ? Uterine size-date discrepancy in third trimester 09/08/2020  ? History of herpes simplex infection 05/25/2020  ? Recurrent UTI (urinary tract infection) complicating pregnancy 03/21/2020  ? Chlamydia 03/21/2020  ? Marijuana use 03/16/2020  ? Rh negative status during pregnancy 03/14/2020  ? Encounter for supervision of normal pregnancy, antepartum 03/08/2020  ? Abnormal Pap smear of cervix 06/27/2018  ? ? ?REFERRING DIAG: M54.50,G89.29 (ICD-10-CM) - Chronic bilateral low back pain without sciatica  ?Chronic bilateral low back pain without sciatica  ?THERAPY DIAG:  ? ? ?PERTINENT HISTORY: 26 year old black female who is new patient to clinic comes in today with complaints of low back pain.  She states that pain started July 2022.  No injury.  States  that she has pain across her low back with sitting and movement at times.  No radicular component.  States that she did go to [facility] urgent care November 2022.  I reviewed that note and patient was prescribed prednisone and naproxen.  She was also seen by her primary care provider August 21, 2021.  She has also tried muscle relaxers along with Aleve and ibuprofen.  She rates her pain 8-1/2 out of 10.  Denies dysuria. ?Review of Systems ?No c/o cardiac, pulm, gi, gu issues.  ? ?PRECAUTIONS: none ? ?SUBJECTIVE: reports constant low back pain since medication has worn off(prednisone), pain is centralized to spine vs paraspinals ? ?PAIN:  ?Are you having pain? Yes: NPRS scale: 7/10 ?Pain location: low back ?Pain description: ache ?Aggravating factors: undetermined ?Relieving factors: undetermined ? ? ? ? ? ?OBJECTIVE:  ?  ?DIAGNOSTIC FINDINGS:  ?FINDINGS: ?There are 5 non-rib-bearing lumbar vertebra. The alignment is ?maintained. Vertebral body heights are normal. There is no ?listhesis. The posterior elements are intact. Disc spaces are ?preserved. No fracture, focal lesion or pars defect. Incidental note ?of non fusion posterior elements of L5. Sacroiliac joints are ?symmetric and normal. IUD in the pelvis. ?  ?IMPRESSION: ?1. Negative radiographs of the lumbar spine. ?2. Incidental note of non fusion posterior elements of L5, normal ?variant. ?  ?  ?Electronically Signed ?  By: Narda Rutherford M.D. ?  On: 08/22/2021 12:56 ?  ?  PATIENT SURVEYS:  ?Modified Oswestry 11%  ?  ?SCREENING FOR RED FLAGS: ?Bowel or bladder incontinence: No ?  ?COGNITION: ?          Overall cognitive status: Within functional limits for tasks assessed               ?           ?SENSATION: ?Not tested ?  ?MUSCLE LENGTH: ?Hamstrings: Right 90 deg; Left 90 deg ?  ?  ?POSTURE:  ?Increased lordosis ?  ?PALPATION: ?Tenderness at thoraco-lumbar junction centrally with discomfort with PA pressure ?  ?LUMBAR ROM:  ?  ?Active  A/PROM  ?09/12/2021   ?Flexion 100%  ?Extension 100%  ?Right lateral flexion 100%  ?Left lateral flexion 100%  ?Right rotation 75%  ?Left rotation 100%  ? (Blank rows = not tested) ?  ?LE ROM: WNL throughout  ?  ?Active  Right ?09/12/2021 Left ?09/12/2021  ?Hip flexion      ?Hip extension      ?Hip abduction      ?Hip adduction      ?Hip internal rotation      ?Hip external rotation      ?Knee flexion      ?Knee extension      ?Ankle dorsiflexion      ?Ankle plantarflexion      ?Ankle inversion      ?Ankle eversion      ?  ?  ?LE MMT: WNL throughout ?  ?MMT Right ?09/12/2021 Left ?09/12/2021  ?Hip flexion      ?Hip extension      ?Hip abduction      ?Hip adduction      ?Hip internal rotation      ?Hip external rotation      ?Knee flexion      ?Knee extension      ?Ankle dorsiflexion      ?Ankle plantarflexion      ?Ankle inversion      ?Ankle eversion      ? (Blank rows = not tested) ?  ?LUMBAR SPECIAL TESTS:  ?Prone instability test: Negative, Straight leg raise test: Negative, Slump test: Negative, and FABER test: Negative ?  ?  ?GAIT: ?Distance walked: 54ft x2 ?Assistive device utilized: None ?Level of assistance: Complete Independence ?  ?  ?  ?  ?TODAY'S TREATMENT  ? ?[facility] Adult PT Treatment:                                                DATE: 09/19/21 ?Therapeutic Exercise: ?Nustep L 4 8 min ?SL open book 10/10 ?Prone press with PT OP 10x ?Curl up x30 ?90/90 hips/knees with OH flexion 30s x1 ?Supine bicycling 30s x3 ? ? ?  ?  ?PATIENT EDUCATION:  ?Education details: Discussed eval findings, rehab rationale and POC and patient is in agreement  ?Person educated: Patient ?Education method: Explanation, Demonstration, and Handouts ?Education comprehension: verbalized understanding, returned demonstration, and needs further education ?  ?  ?HOME EXERCISE PROGRAM: ?Access Code: [email or web address] ?URL: [email or web address] ?Date: 09/12/2021 ?Prepared by: Gustavus Bryant ?  ?Exercises ?Sidelying Open Book Thoracic Lumbar Rotation and  Extension - 2 x daily - 7 x weekly - 1 sets - 10 reps - 3s hold ?  ?  ?ASSESSMENT: ?  ?CLINICAL IMPRESSION: Steroidal medication has worn off and pain has returned.  Patient  cites pain as constant only getting temporary relief from stretches and position changes.  Patient unable to relate distinct aggravating or relieving factors.  Lumbar ROM WNL all planes, pain reproduced with isometric core tasks which were added to HEP.  Symptoms appear spinal related vs muscular ? ?  ?  ?OBJECTIVE IMPAIRMENTS decreased knowledge of condition, decreased mobility, decreased ROM, postural dysfunction, and pain.  ?  ?  ?PERSONAL FACTORS Age, Fitness, and Time since onset of injury/illness/exacerbation are also affecting patient's functional outcome.  ?  ?  ?REHAB POTENTIAL: Good ?  ?CLINICAL DECISION MAKING: Stable/uncomplicated ?  ?EVALUATION COMPLEXITY: Low ?  ?  ?GOALS: ?Goals reviewed with patient? Yes ?  ?SHORT TERM GOALS: STGs=LTGs ?  ?LONG TERM GOALS: Target date: 10/03/2021 ?  ?Decrease worst pain to 2/10 ?Baseline: 5/10 at worst ?Goal status: INITIAL ?  ?2.  Increase R rotation to 90% actively ?Baseline: 75% AROM R rotation ?Goal status: INITIAL ?  ?3.  Patient to demonstrate independence in HEP  ?Baseline: [email or web address] ?Goal status: INITIAL ?  ?4.  No symptoms with spring testing ?Baseline: discomfort with spring testing ?Goal status: INITIAL ?  ?  ?PLAN: ?PT FREQUENCY: 1x/week ?  ?PT DURATION: 3 weeks ?  ?PLANNED INTERVENTIONS: Therapeutic exercises, Therapeutic activity, Neuromuscular re-education, Balance training, Gait training, Patient/Family education, Joint manipulation, Joint mobilization, Dry Needling, and Manual therapy. ?  ?PLAN FOR NEXT SESSION: assess HEP benefit, core strengthening, f/u on pain drivers ? ? ? ?Hildred LaserJeffrey M Shivali Quackenbush, PT ?09/19/2021, 2:15 PM ? ?   ?

## 2021-10-03 ENCOUNTER — Encounter: Payer: Self-pay | Admitting: Family Medicine

## 2021-10-03 ENCOUNTER — Ambulatory Visit: Payer: Medicaid Other

## 2021-10-03 ENCOUNTER — Ambulatory Visit (INDEPENDENT_AMBULATORY_CARE_PROVIDER_SITE_OTHER): Payer: Medicaid Other | Admitting: Family Medicine

## 2021-10-03 ENCOUNTER — Ambulatory Visit: Payer: Medicaid Other | Attending: Surgery

## 2021-10-03 VITALS — BP 116/81 | HR 70 | Temp 98.0°F | Resp 16 | Wt 133.6 lb

## 2021-10-03 DIAGNOSIS — M6281 Muscle weakness (generalized): Secondary | ICD-10-CM | POA: Insufficient documentation

## 2021-10-03 DIAGNOSIS — M546 Pain in thoracic spine: Secondary | ICD-10-CM | POA: Diagnosis not present

## 2021-10-03 DIAGNOSIS — R102 Pelvic and perineal pain: Secondary | ICD-10-CM | POA: Diagnosis not present

## 2021-10-03 DIAGNOSIS — M545 Low back pain, unspecified: Secondary | ICD-10-CM | POA: Insufficient documentation

## 2021-10-03 DIAGNOSIS — G8929 Other chronic pain: Secondary | ICD-10-CM | POA: Diagnosis not present

## 2021-10-03 NOTE — Therapy (Addendum)
?OUTPATIENT PHYSICAL THERAPY TREATMENT NOTE/DC SUMMARY ? ? ?Patient Name: Chelsea Burgess ?MRN: [id number] ?DOB:Apr 24, 1996, 26 y.o., female ?Today's Date: 10/03/2021 ? ?PCP: Dorna Mai, MD ?REFERRING PROVIDER: Toula Moos, MD ?PHYSICAL THERAPY DISCHARGE SUMMARY ? ?Visits from Start of Care: 3 ? ?Current functional level related to goals / functional outcomes: ?improved ?  ?Remaining deficits: ?UTA ?  ?Education / Equipment: ?HEP  ? ?Patient agrees to discharge. Patient goals were partially met. Patient is being discharged due to not returning since the last visit.  ? PT End of Session - 10/03/21 1325   ? ? Visit Number 3   ? Number of Visits 4   ? Date for PT Re-Evaluation 11/07/21   ? Authorization Type UHC MCD   ? Progress Note Due on Visit 4   ? PT Start Time 1325   ? PT Stop Time 1355   ? PT Time Calculation (min) 30 min   ? Activity Tolerance Patient tolerated treatment well   ? Behavior During Therapy [facility] for tasks assessed/performed   ? ?  ?  ? ?  ? ? ?Past Medical History:  ?Diagnosis Date  ? Herpes   ? Medical history non-contributory   ? ?Past Surgical History:  ?Procedure Laterality Date  ? NO PAST SURGERIES    ? ?Patient Active Problem List  ? Diagnosis Date Noted  ? Chronic bilateral low back pain without sciatica 08/22/2021  ? Chronic bilateral thoracic back pain 08/22/2021  ? Vaginal bleeding 01/02/2021  ? IUD check up 01/02/2021  ? Screen for STD (sexually transmitted disease) 01/02/2021  ? IUD (intrauterine device) in place 09/22/2020  ? Post term pregnancy over 40 weeks 09/21/2020  ? Uterine size-date discrepancy in third trimester 09/08/2020  ? History of herpes simplex infection 05/25/2020  ? Recurrent UTI (urinary tract infection) complicating pregnancy 18/29/9371  ? Chlamydia 03/21/2020  ? Marijuana use 03/16/2020  ? Rh negative status during pregnancy 03/14/2020  ? Encounter for supervision of normal pregnancy, antepartum 03/08/2020  ? Abnormal Pap smear of cervix 06/27/2018   ? ? ?REFERRING DIAG: M54.50,G89.29 (ICD-10-CM) - Chronic bilateral low back pain without sciatica  ?Chronic bilateral low back pain without sciatica  ?THERAPY DIAG:  ? ? ?PERTINENT HISTORY: 26 year old black female who is new patient to clinic comes in today with complaints of low back pain.  She states that pain started July 2022.  No injury.  States that she has pain across her low back with sitting and movement at times.  No radicular component.  States that she did go to [facility] urgent care November 2022.  I reviewed that note and patient was prescribed prednisone and naproxen.  She was also seen by her primary care provider August 21, 2021.  She has also tried muscle relaxers along with Aleve and ibuprofen.  She rates her pain 8-1/2 out of 10.  Denies dysuria. ?Review of Systems ?No c/o cardiac, pulm, gi, gu issues.  ? ?PRECAUTIONS: none ? ?SUBJECTIVE: Pain intensity less, still self limits herself from lifting tasks and picking up her daughter.  Saw MD this AM and suggested IUD may be contibuting to back pain ? ?PAIN:  ?Are you having pain? Yes: NPRS scale: 4/10 ?Pain location: low back ?Pain description: ache ?Aggravating factors: undetermined ?Relieving factors: undetermined ? ? ? ? ? ?OBJECTIVE:  ?  ?DIAGNOSTIC FINDINGS:  ?FINDINGS: ?There are 5 non-rib-bearing lumbar vertebra. The alignment is ?maintained. Vertebral body heights are normal. There is no ?listhesis. The posterior elements are intact. Disc spaces are ?  preserved. No fracture, focal lesion or pars defect. Incidental note ?of non fusion posterior elements of L5. Sacroiliac joints are ?symmetric and normal. IUD in the pelvis. ?  ?IMPRESSION: ?1. Negative radiographs of the lumbar spine. ?2. Incidental note of non fusion posterior elements of L5, normal ?variant. ?  ?  ?Electronically Signed ?  By: Keith Rake M.D. ?  On: 08/22/2021 12:56 ?  ?PATIENT SURVEYS:  ?Modified Oswestry 11%  ?  ?SCREENING FOR RED FLAGS: ?Bowel or bladder  incontinence: No ?  ?COGNITION: ?          Overall cognitive status: Within functional limits for tasks assessed               ?           ?SENSATION: ?Not tested ?  ?MUSCLE LENGTH: ?Hamstrings: Right 90 deg; Left 90 deg ?  ?  ?POSTURE:  ?Increased lordosis ?  ?PALPATION: ?Tenderness at thoraco-lumbar junction centrally with discomfort with PA pressure ?  ?LUMBAR ROM:  ?  ?Active  A/PROM  ?09/12/2021  ?Flexion 100%  ?Extension 100%  ?Right lateral flexion 100%  ?Left lateral flexion 100%  ?Right rotation 75%  ?Left rotation 100%  ? (Blank rows = not tested) ?  ?LE ROM: WNL throughout  ?  ?Active  Right ?09/12/2021 Left ?09/12/2021  ?Hip flexion      ?Hip extension      ?Hip abduction      ?Hip adduction      ?Hip internal rotation      ?Hip external rotation      ?Knee flexion      ?Knee extension      ?Ankle dorsiflexion      ?Ankle plantarflexion      ?Ankle inversion      ?Ankle eversion      ?  ?  ?LE MMT: WNL throughout ?  ?MMT Right ?09/12/2021 Left ?09/12/2021  ?Hip flexion      ?Hip extension      ?Hip abduction      ?Hip adduction      ?Hip internal rotation      ?Hip external rotation      ?Knee flexion      ?Knee extension      ?Ankle dorsiflexion      ?Ankle plantarflexion      ?Ankle inversion      ?Ankle eversion      ? (Blank rows = not tested) ?  ?LUMBAR SPECIAL TESTS:  ?Prone instability test: Negative, Straight leg raise test: Negative, Slump test: Negative, and FABER test: Negative ?  ?  ?GAIT: ?Distance walked: 50f x2 ?Assistive device utilized: None ?Level of assistance: Complete Independence ?  ?  ?  ?  ?[facility]REAT[facility]  [facility]                                                [facility] ?Therapeutic Exercise: ?Nustep L 6 8 min ?Single leg bridge 30/30 ?QL stretch 30s x1 B ?Open book 10/10 ?Side plank 30s B ?Side plank w/clams 15/15 ?Hips/knees 961/90with OH reach [id number] ? ?[facility]  [facility] ?Therapeutic  Exercise: ?Nustep L 4 8 min ?SL open book 10/10 ?Prone press with PT OP 10x ?Curl up x30 ?90/90 hips/knees with OH flexion 30s x1 ?Supine bicycling 30s x3 ? ? ?  ?  ?PATIENT EDUCATION:  ?Education details: Discussed eval findings, rehab rationale and POC and patient is in agreement  ?Person educated: Patient ?Education method: Explanation, Demonstration, and Handouts ?Education comprehension: verbalized understanding, returned demonstration, and needs further education ?  ?  ?HOME EXERCISE PROGRAM: ?Access Code: [email or web address] ?URL: [email or web address] ?Date: 09/12/2021 ?Prepared by: Sharlynn Oliphant ?  ?Exercises ?Sidelying Open Book Thoracic Lumbar Rotation and Extension - 2 x daily - 7 x weekly - 1 sets - 10 reps - 3s hold ?  ?  ?ASSESSMENT: ?  ?CLINICAL IMPRESSION: pain levels have lessened, exploring possible IUD as source of pain, feels ready to transition toHEP at next visit as symptoms are manageable. No symptom reproduction with advanced core strengthening tasks today.  ? ?  ?  ?OBJECTIVE IMPAIRMENTS decreased knowledge of condition, decreased mobility, decreased ROM, postural dysfunction, and pain.  ?  ?  ?PERSONAL FACTORS Age, Fitness, and Time since onset of injury/illness/exacerbation are also affecting patient's functional outcome.  ?  ?  ?REHAB POTENTIAL: Good ?  ?CLINICAL DECISION MAKING: Stable/uncomplicated ?  ?EVALUATION COMPLEXITY: Low ?  ?  ?GOALS: ?Goals reviewed with patient? Yes ?  ?SHORT TERM GOALS: STGs=LTGs ?  ?LONG TERM GOALS: Target date: 10/03/2021 ?  ?Decrease worst pain to 2/10 ?Baseline: 5/10 at worst; 10/03/21 4/10 ?Goal status: INITIAL ?  ?2.  Increase R rotation to 90% actively ?Baseline: 75% AROM R rotation; 10/03/21 100% B rotation ?Goal status: Met ?  ?3.  Patient to demonstrate independence in HEP  ?Baseline: [email or web address] ?Goal status: Met ?  ?4.  No symptoms with spring testing ?Baseline: discomfort with spring testing ?Goal status: INITIAL ?  ?  ?PLAN: ?PT FREQUENCY:  1x/week ?  ?PT DURATION: 3 weeks ?  ?PLANNED INTERVENTIONS: Therapeutic exercises, Therapeutic activity, Neuromuscular re-education, Balance training, Gait training, Patient/Family education, Joint manipulation, Joint mobilization

## 2021-10-03 NOTE — Progress Notes (Signed)
Patient is here for f/u back pain  Patient said that she is still going to PT for her back and ir's helping a little. ?

## 2021-10-04 ENCOUNTER — Encounter: Payer: Self-pay | Admitting: Family Medicine

## 2021-10-04 NOTE — Progress Notes (Signed)
? ?Established Patient Office Visit ? ?Subjective:  ?Patient ID: Chelsea Burgess, female    DOB: [birth year]  Age: 26 y.o. MRN: [id number] ? ?CC:  ?Chief Complaint  ?Patient presents with  ? Follow-up  ? Back Pain  ? ? ?HPI ?Chelsea Burgess presents for follow up of back pain. Patient reports that she has been seeing ortho and started PT and notes some minimal improvement. Patient also reports pelvic pain and wonders if those symptoms can be contributing to her back pain.  ? ?Past Medical History:  ?Diagnosis Date  ? Herpes   ? Medical history non-contributory   ? ? ?Past Surgical History:  ?Procedure Laterality Date  ? NO PAST SURGERIES    ? ? ?Family History  ?Problem Relation Age of Onset  ? Stroke Mother   ? Heart attack Mother   ? Heart attack Father   ? Stroke Maternal Aunt   ? Diabetes Maternal Grandmother   ? Cancer Maternal Grandfather   ?     colon  ? ? ?Social History  ? ?Socioeconomic History  ? Marital status: Single  ?  Spouse name: Not on file  ? Number of children: 1  ? Years of education: Not on file  ? Highest education level: Not on file  ?Occupational History  ? Not on file  ?Tobacco Use  ? Smoking status: Never  ? Smokeless tobacco: Never  ?Vaping Use  ? Vaping Use: Never used  ?Substance and Sexual Activity  ? Alcohol use: Not Currently  ? Drug use: Never  ? Sexual activity: Yes  ?  Birth control/protection: I.U.D.  ?Other Topics Concern  ? Not on file  ?Social History Narrative  ? ** Merged History Encounter **  ?    ? ** Merged History Encounter **  ?    ? ?Social Determinants of Health  ? ?Financial Resource Strain: Not on file  ?Food Insecurity: Not on file  ?Transportation Needs: Not on file  ?Physical Activity: Not on file  ?Stress: Not on file  ?Social Connections: Not on file  ?Intimate Partner Violence: Not on file  ? ? ?ROS ?Review of Systems  ?Genitourinary:  Positive for pelvic pain. Negative for menstrual problem.  ?Musculoskeletal:  Positive for back pain.  ?All other systems  reviewed and are negative. ? ?Objective:  ? ?Today's Vitals: BP 116/81   Pulse 70   Temp 98 ?F (36.7 ?C) (Oral)   Resp 16   Wt 133 lb 9.6 oz (60.6 kg)   SpO2 99%   BMI 20.31 kg/m?  ? ?Physical Exam ?Vitals and nursing note reviewed.  ?Constitutional:   ?   General: She is not in acute distress. ?Cardiovascular:  ?   Rate and Rhythm: Normal rate and regular rhythm.  ?Pulmonary:  ?   Effort: Pulmonary effort is normal.  ?   Breath sounds: Normal breath sounds.  ?Abdominal:  ?   Palpations: Abdomen is soft.  ?   Tenderness: There is abdominal tenderness.  ?Musculoskeletal:  ?   Lumbar back: Tenderness present. No spasms. Decreased range of motion.  ?Neurological:  ?   General: No focal deficit present.  ?   Mental Status: She is alert and oriented to person, place, and time.  ? ? ?Assessment & Plan:  ? ?1. Chronic bilateral thoracic back pain ?Minimal improvement. Continue PT and management per concsultant ? ?2. Pelvic pain ?Referral to gyn for further eval/mgt. Tylenol/ nsaids prn ? ?-Ambulatory referral to Gynecology ? ? ? ?Outpatient  Encounter Medications as of 10/03/2021  ?Medication Sig  ? cyclobenzaprine (FLEXERIL) 10 MG tablet Take 1 tablet (10 mg total) by mouth 3 (three) times daily as needed for muscle spasms.  ? doxycycline (VIBRAMYCIN) 100 MG capsule Take 1 capsule (100 mg total) by mouth 2 (two) times daily.  ? ibuprofen (ADVIL) 100 MG/5ML suspension Take 30 mLs (600 mg total) by mouth every 6 (six) hours. As needed for pain/cramps  ? levonorgestrel (LILETTA, 52 MG,) 20.1 MCG/DAY IUD 1 each by Intrauterine route once.  ? methylPREDNISolone (MEDROL) 4 MG tablet 6 day taper to be taken as directed.  ? ?No facility-administered encounter medications on file as of 10/03/2021.  ? ? ?Follow-up: No follow-ups on file.  ? ?Tommie Raymond, MD ? ?

## 2021-12-22 ENCOUNTER — Other Ambulatory Visit (HOSPITAL_COMMUNITY)
Admission: RE | Admit: 2021-12-22 | Discharge: 2021-12-22 | Disposition: A | Discharge status: Home / Self Care | Payer: Medicaid Other | Source: Ambulatory Visit | Attending: Adult Health | Admitting: Adult Health

## 2021-12-22 ENCOUNTER — Ambulatory Visit: Payer: Medicaid Other | Admitting: Adult Health

## 2021-12-22 ENCOUNTER — Encounter: Payer: Self-pay | Admitting: Adult Health

## 2021-12-22 VITALS — BP 122/79 | HR 76 | Ht 67.0 in | Wt 136.0 lb

## 2021-12-22 DIAGNOSIS — M545 Low back pain, unspecified: Secondary | ICD-10-CM | POA: Diagnosis not present

## 2021-12-22 DIAGNOSIS — G8929 Other chronic pain: Secondary | ICD-10-CM

## 2021-12-22 DIAGNOSIS — N898 Other specified noninflammatory disorders of vagina: Secondary | ICD-10-CM | POA: Diagnosis present

## 2021-12-22 DIAGNOSIS — R102 Pelvic and perineal pain: Secondary | ICD-10-CM | POA: Diagnosis not present

## 2021-12-22 DIAGNOSIS — Z975 Presence of (intrauterine) contraceptive device: Secondary | ICD-10-CM

## 2021-12-22 DIAGNOSIS — Z113 Encounter for screening for infections with a predominantly sexual mode of transmission: Secondary | ICD-10-CM

## 2021-12-22 NOTE — Progress Notes (Signed)
  Subjective:     Patient ID: Chelsea Burgess, female   DOB: July 04, 1995, 26 y.o.   MRN: [id number]  HPI Chelsea Burgess is a 26 year old black female,with SO, [id number] in complaining of pelvic pain on and off that is random. She also has had low back pain for over a year has seen orthopedic and had PT with out relief. She has IUD.  Lab Results  Component Value Date   DIAGPAP  04/13/2020    - Negative for intraepithelial lesion or malignancy (NILM)   HPV DETECTED (A) 06/19/2018   PCP is Dr Andrey Campanile.  Review of Systems Pelvic pain on and off Has had low back for a year Reviewed past medical,surgical, social and family history. Reviewed medications and allergies.     Objective:   Physical Exam BP 122/79 (BP Location: Left Arm, Patient Position: Sitting, Cuff Size: Normal)   Pulse 76   Ht 5\' 7"  (1.702 m)   Wt 136 lb (61.7 kg)   Breastfeeding No   BMI 21.30 kg/m     Skin warm and dry.Pelvic: external genitalia is normal in appearance no lesions, vagina: white discharge without odor,urethra has no lesions or masses noted, cervix: bulbous, +IUD strings at os, CV swab obtained, uterus: normal size, shape and contour, non tender, no masses felt, adnexa: no masses or tenderness noted. Bladder is non tender and no masses felt. No point tenderness low back, but she feels when bends. Fall risk is low  Upstream - 12/22/21 1048       Pregnancy Intention Screening   Does the patient want to become pregnant in the next year? No    Does the patient's partner want to become pregnant in the next year? No    Would the patient like to discuss contraceptive options today? No      Contraception Wrap Up   Current Method IUD or IUS    End Method IUD or IUS            Examination chaperoned by 12/24/21 LPN  Assessment:     1. Pelvic pain Will get pelvic Malachy Mood 12/29/21 at 11:30 am at [facility] to assess uterus and ovaries - [facility] PELVIC COMPLETE WITH TRANSVAGINAL; Future  2. IUD (intrauterine device) in  place Liletta placed 10/27/20 - 12/27/20 PELVIC COMPLETE WITH TRANSVAGINAL; Future  3. Chronic bilateral low back pain without sciatica I would recommend chiropractor   4. Screen for STD (sexually transmitted disease) CV swab sent for GC/CHL,trich and BV,yeast  - Cervicovaginal ancillary only( [place])  5. Vaginal discharge - Cervicovaginal ancillary only( [place])     Plan:     Will talk when results back Follow up prn

## 2021-12-25 LAB — CERVICOVAGINAL ANCILLARY ONLY
Bacterial Vaginitis (gardnerella): NEGATIVE
Candida Glabrata: NEGATIVE
Candida Vaginitis: POSITIVE — AB
Chlamydia: NEGATIVE
Comment: NEGATIVE
Comment: NEGATIVE
Comment: NEGATIVE
Comment: NEGATIVE
Comment: NEGATIVE
Comment: NORMAL
Neisseria Gonorrhea: NEGATIVE
Trichomonas: NEGATIVE

## 2021-12-27 ENCOUNTER — Other Ambulatory Visit: Payer: Self-pay | Admitting: Adult Health

## 2021-12-27 MED ORDER — FLUCONAZOLE 150 MG PO TABS: ORAL_TABLET | ORAL | 1 refills | Status: DC

## 2021-12-27 NOTE — Progress Notes (Signed)
Rx diflucan for yeast

## 2021-12-29 ENCOUNTER — Ambulatory Visit (HOSPITAL_COMMUNITY): Admission: RE | Admit: 2021-12-29 | Payer: Medicaid Other | Source: Ambulatory Visit

## 2022-01-02 ENCOUNTER — Ambulatory Visit: Payer: Medicaid Other | Admitting: Family Medicine

## 2022-03-15 ENCOUNTER — Other Ambulatory Visit (INDEPENDENT_AMBULATORY_CARE_PROVIDER_SITE_OTHER): Payer: Medicaid Other | Admitting: *Deleted

## 2022-03-15 DIAGNOSIS — R3915 Urgency of urination: Secondary | ICD-10-CM

## 2022-03-15 DIAGNOSIS — R399 Unspecified symptoms and signs involving the genitourinary system: Secondary | ICD-10-CM

## 2022-03-15 LAB — POCT URINALYSIS DIPSTICK OB
Blood, UA: NEGATIVE
Glucose, UA: NEGATIVE
Ketones, UA: NEGATIVE
Nitrite, UA: NEGATIVE

## 2022-03-15 NOTE — Progress Notes (Signed)
   NURSE VISIT- UTI SYMPTOMS   SUBJECTIVE:  Chelsea Burgess is a 26 y.o. [phone] female here for UTI symptoms. She is a GYN patient. She reports urinary frequency.  OBJECTIVE:  There were no vitals taken for this visit.  Appears well, in no apparent distress  No results found for this or any previous visit (from the past 24 hour(s)).  ASSESSMENT: GYN patient with UTI symptoms and negative nitrites  PLAN: Note routed to Derrek Monaco, AGNP   Rx sent by provider today: No Urine culture sent Call or return to clinic prn if these symptoms worsen or fail to improve as anticipated. Follow-up: as needed   Janece Canterbury  03/15/2022 2:49 PM

## 2022-03-16 LAB — URINALYSIS
Bilirubin, UA: NEGATIVE
Glucose, UA: NEGATIVE
Ketones, UA: NEGATIVE
Nitrite, UA: NEGATIVE
RBC, UA: NEGATIVE
Specific Gravity, UA: >=1.03 — AB (ref 1.005–1.030)
Urobilinogen, Ur: 1 mg/dL (ref 0.2–1.0)
pH, UA: 6.5 (ref 5.0–7.5)

## 2022-03-18 LAB — URINE CULTURE

## 2022-03-25 ENCOUNTER — Encounter (HOSPITAL_COMMUNITY): Payer: Self-pay

## 2022-03-25 ENCOUNTER — Ambulatory Visit (HOSPITAL_COMMUNITY)
Admission: EM | Admit: 2022-03-25 | Discharge: 2022-03-25 | Disposition: A | Discharge status: Home / Self Care | Payer: Medicaid Other | Attending: Physician Assistant | Admitting: Physician Assistant

## 2022-03-25 DIAGNOSIS — R350 Frequency of micturition: Secondary | ICD-10-CM | POA: Insufficient documentation

## 2022-03-25 DIAGNOSIS — G8929 Other chronic pain: Secondary | ICD-10-CM | POA: Diagnosis not present

## 2022-03-25 DIAGNOSIS — N898 Other specified noninflammatory disorders of vagina: Secondary | ICD-10-CM | POA: Insufficient documentation

## 2022-03-25 DIAGNOSIS — M545 Low back pain, unspecified: Secondary | ICD-10-CM | POA: Diagnosis not present

## 2022-03-25 LAB — POCT URINALYSIS DIPSTICK, ED / UC
Glucose, UA: NEGATIVE mg/dL
Hgb urine dipstick: NEGATIVE
Ketones, ur: NEGATIVE mg/dL
Leukocytes,Ua: NEGATIVE
Nitrite: NEGATIVE
Protein, ur: 30 mg/dL — AB
Specific Gravity, Urine: 1.025 (ref 1.005–1.030)
Urobilinogen, UA: 0.2 mg/dL (ref 0.0–1.0)
pH: 6.5 (ref 5.0–8.0)

## 2022-03-25 MED ORDER — FLAVOXATE HCL 100 MG PO TABS: 100.0000 mg | ORAL_TABLET | Freq: Three times a day (TID) | ORAL | 0 refills | Status: DC | PRN

## 2022-03-25 NOTE — ED Provider Notes (Signed)
[place]    CSN: [id number] Arrival date & time: 03/25/22  1001      History   Chief Complaint Chief Complaint  Patient presents with   Urinary Frequency    HPI Chelsea Burgess is a 26 y.o. female.   26 year old female presents with frequency, urgency, lower back pain.  Patient indicates for the past week she has been having increased frequency, urgency, without dysuria.  Patient relates that she feels like she has pressure on the bladder.  She indicates she did go to her OB/GYN specialist about 10 days ago and they checked a urine and it was normal.  She relates that she has not having any fever, or hematuria.  Patient also indicates that she has an IUD in place and that she started having some mild vaginal bleeding, he also indicates of vaginal irritation and itching on the inside of the vaginal canal however she relates she is not having any vaginal discharge due to having some vaginal bleeding or spotting that just started..  She indicates that she notices the blood when she wipes.  She indicates that it is only been going on for 1 to 2 days and she is having some cramping associated with it. Patient also indicates that she has chronic lower back pain which she has been seen by orthopedics for and they referred her to physical therapy.  She relates she still continues to have lower back pain which is in the midportion of her lower back and worse when she bends over and lifts objects.  She indicates she is not having any numbness, tingling, or weakness of the lower extremities.  She does relate that she has started working out and lifting trying to condition her core and her back muscles and this seems to help a little bit.  She does indicate that she is going to see a chiropractor to see about getting some adjustments to see if this will also help.  Does not want to be taking any medications for her lower back pain because she wants to fix the problem.   Urinary  Frequency    Past Medical History:  Diagnosis Date   Herpes    Medical history non-contributory     Patient Active Problem List   Diagnosis Date Noted   Pelvic pain 12/22/2021   Vaginal discharge 12/22/2021   Chronic bilateral low back pain without sciatica 08/22/2021   Chronic bilateral thoracic back pain 08/22/2021   Vaginal bleeding 01/02/2021   IUD check up 01/02/2021   Screen for STD (sexually transmitted disease) 01/02/2021   IUD (intrauterine device) in place 09/22/2020   Post term pregnancy over 40 weeks 09/21/2020   Uterine size-date discrepancy in third trimester 09/08/2020   History of herpes simplex infection 05/25/2020   Recurrent UTI (urinary tract infection) complicating pregnancy [id number]   Chlamydia 03/21/2020   Marijuana use 03/16/2020   Rh negative status during pregnancy 03/14/2020   Encounter for supervision of normal pregnancy, antepartum 03/08/2020   Abnormal Pap smear of cervix 06/27/2018    Past Surgical History:  Procedure Laterality Date   NO PAST SURGERIES      OB History     Gravida  3   Para  2   Term  2   Preterm  0   AB  1   Living  2      SAB  1   IAB  0   Ectopic  0   Multiple  0  Live Births  2            Home Medications    Prior to Admission medications   Medication Sig Start Date End Date Taking? Authorizing Provider  flavoxATE (URISPAS) 100 MG tablet Take 1 tablet (100 mg total) by mouth 3 (three) times daily as needed for bladder spasms. 03/25/22  Yes Nyoka Lint, PA-C  levonorgestrel (LILETTA, 52 MG,) 20.1 MCG/DAY IUD 1 each by Intrauterine route once.   Yes [provider]  fluconazole (DIFLUCAN) 150 MG tablet Take 1 now and 1 in 3 days 12/27/21   Estill Dooms, NP    Family History Family History  Problem Relation Age of Onset   Diabetes Maternal Grandmother    Cancer Maternal Grandfather        colon   Heart attack Father    Stroke Mother    Heart attack Mother    Stroke  Maternal Aunt     Social History Social History   Tobacco Use   Smoking status: Never   Smokeless tobacco: Never  Vaping Use   Vaping Use: Never used  Substance Use Topics   Alcohol use: Yes    Comment: occ   Drug use: Never     Allergies   Patient has no known allergies.   Review of Systems Review of Systems  Genitourinary:  Positive for frequency and urgency.  Musculoskeletal:  Positive for back pain (mid lower back).     Physical Exam Triage Vital Signs ED Triage Vitals  Enc Vitals Group     BP 03/25/22 1013 109/79     Pulse Rate 03/25/22 1013 67     Resp 03/25/22 1013 16     Temp 03/25/22 1013 98.7 F (37.1 C)     Temp Source 03/25/22 1013 Oral     SpO2 03/25/22 1013 91 %     Weight --      Height --      Head Circumference --      Peak Flow --      Pain Score 03/25/22 1016 6     Pain Loc --      Pain Edu? --      Excl. in [place]? --    No data found.  Updated Vital Signs BP 109/79 (BP Location: Left Arm)   Pulse 67   Temp 98.7 F (37.1 C) (Oral)   Resp 16   SpO2 91%   Visual Acuity Right Eye Distance:   Left Eye Distance:   Bilateral Distance:    Right Eye Near:   Left Eye Near:    Bilateral Near:     Physical Exam Constitutional:      Appearance: Normal appearance.  Abdominal:     General: Abdomen is flat. Bowel sounds are normal.     Palpations: Abdomen is soft.     Tenderness: There is no abdominal tenderness.  Musculoskeletal:       Back:     Comments: Back: Pain is palpated along the L3-L5 area midline.  There is no unusual swelling or tenderness of paraspinous areas bilaterally, no redness or swelling. Range of motion is limited with pain on bending over.  Negative straight leg raise bilaterally, strength is intact bilaterally.  Neurological:     Mental Status: She is alert.      UC Treatments / Results  Labs (all labs ordered are listed, but only abnormal results are displayed) Labs Reviewed  POCT URINALYSIS DIPSTICK,  ED / UC - Abnormal;  Notable for the following components:      Result Value   Bilirubin Urine SMALL (*)    Protein, ur 30 (*)    All other components within normal limits  CERVICOVAGINAL ANCILLARY ONLY    EKG   Radiology No results found.  Procedures Procedures (including critical care time)  Medications Ordered in UC Medications - No data to display  Initial Impression / Assessment and Plan / UC Course  I have reviewed the triage vital signs and the nursing notes.  Pertinent labs & imaging results that were available during my care of the patient were reviewed by me and considered in my medical decision making (see chart for details).    Plan: 1.  The urinary symptoms will be treated with the following: A.  Urispas 3 times a day to help reduce any bladder irritability. 2.  Chronic lower back pain will be treated with the following: A.  Patient advised to continue seeking chiropractic care for adjustments. B.  Patient advised to continue with with core and back strengthening exercises to help reduce the pain. 3.  The vaginal irritability will be treated with the following: A.  Lab testing for yeast and BV is pending. 4.  Patient advised to follow-up with PCP or return to urgent care if symptoms fail to improve.  Final Clinical Impressions(s) / UC Diagnoses   Final diagnoses:  Urinary frequency  Vaginal discharge  Chronic midline low back pain without sciatica     Discharge Instructions      Lab test will be completed in 48 hours, if you do not hear from this office within that timeframe that indicates the test is negative.  Ingal MyChart to view the results in 48 hours when it post. Advised to continue drinking fluids frequently in order to help flush the kidney system.  There is no evidence of active urinary tract infection given the urinary results.  Sometimes he can have a urinary bladder spasm which causes similar symptoms to occur. Advised to continue seeing a  chiropractor and exercising in order to decrease lower back pain. Follow-up with PCP or return to urgent care if symptoms fail to improve.    ED Prescriptions     Medication Sig Dispense Auth. Provider   flavoxATE (URISPAS) 100 MG tablet Take 1 tablet (100 mg total) by mouth 3 (three) times daily as needed for bladder spasms. 15 tablet Nyoka Lint, PA-C      PDMP not reviewed this encounter.   Nyoka Lint, PA-C 03/25/22 1105

## 2022-03-25 NOTE — Discharge Instructions (Addendum)
Lab test will be completed in 48 hours, if you do not hear from this office within that timeframe that indicates the test is negative.  Ingal MyChart to view the results in 48 hours when it post. Advised to continue drinking fluids frequently in order to help flush the kidney system.  There is no evidence of active urinary tract infection given the urinary results.  Sometimes he can have a urinary bladder spasm which causes similar symptoms to occur. Advised to continue seeing a chiropractor and exercising in order to decrease lower back pain. Follow-up with PCP or return to urgent care if symptoms fail to improve.

## 2022-03-25 NOTE — ED Triage Notes (Signed)
Patient having urinary urgency with decreased output. Patient having vaginal irritation. Onset one and half weeks.   Patient having vaginal bleeding that is not normal for her. No periods with new birth control from a year ago, IUD>

## 2022-03-26 ENCOUNTER — Telehealth (HOSPITAL_COMMUNITY): Payer: Self-pay | Admitting: Emergency Medicine

## 2022-03-26 LAB — CERVICOVAGINAL ANCILLARY ONLY
Bacterial Vaginitis (gardnerella): POSITIVE — AB
Candida Glabrata: NEGATIVE
Candida Vaginitis: POSITIVE — AB
Chlamydia: NEGATIVE
Comment: NEGATIVE
Comment: NEGATIVE
Comment: NEGATIVE
Comment: NEGATIVE
Comment: NEGATIVE
Comment: NORMAL
Neisseria Gonorrhea: NEGATIVE
Trichomonas: NEGATIVE

## 2022-03-26 MED ORDER — METRONIDAZOLE 500 MG PO TABS: 500.0000 mg | ORAL_TABLET | Freq: Two times a day (BID) | ORAL | 0 refills | Status: DC

## 2022-03-26 MED ORDER — FLUCONAZOLE 150 MG PO TABS: 150.0000 mg | ORAL_TABLET | Freq: Once | ORAL | 0 refills | Status: AC

## 2022-04-11 ENCOUNTER — Emergency Department (HOSPITAL_COMMUNITY)
Admission: EM | Admit: 2022-04-11 | Discharge: 2022-04-11 | Discharge status: Against Medical Advice | Payer: Medicaid Other | Attending: Emergency Medicine | Admitting: Emergency Medicine

## 2022-04-11 ENCOUNTER — Encounter (HOSPITAL_COMMUNITY): Payer: Self-pay

## 2022-04-11 DIAGNOSIS — Z5321 Procedure and treatment not carried out due to patient leaving prior to being seen by health care provider: Secondary | ICD-10-CM | POA: Diagnosis not present

## 2022-04-11 DIAGNOSIS — R200 Anesthesia of skin: Secondary | ICD-10-CM | POA: Insufficient documentation

## 2022-04-11 DIAGNOSIS — R519 Headache, unspecified: Secondary | ICD-10-CM | POA: Insufficient documentation

## 2022-04-11 LAB — BASIC METABOLIC PANEL
Anion gap: 6 (ref 5–15)
BUN: 12 mg/dL (ref 6–20)
CO2: 25 mmol/L (ref 22–32)
Calcium: 10 mg/dL (ref 8.9–10.3)
Chloride: 108 mmol/L (ref 98–111)
Creatinine, Ser: 0.7 mg/dL (ref 0.44–1.00)
GFR, Estimated: >60 mL/min (ref >60)
Glucose, Bld: 96 mg/dL (ref 70–99)
Potassium: 4 mmol/L (ref 3.5–5.1)
Sodium: 139 mmol/L (ref 135–145)

## 2022-04-11 LAB — CBC
HCT: 42.5 % (ref 36.0–46.0)
Hemoglobin: 14 g/dL (ref 12.0–15.0)
MCH: 29.6 pg (ref 26.0–34.0)
MCHC: 32.9 g/dL (ref 30.0–36.0)
MCV: 89.9 fL (ref 80.0–100.0)
Platelets: 227 10*3/uL (ref 150–400)
RBC: 4.73 MIL/uL (ref 3.87–5.11)
RDW: 12.5 % (ref 11.5–15.5)
WBC: 7.1 10*3/uL (ref 4.0–10.5)
nRBC: 0 % (ref 0.0–0.2)

## 2022-04-11 MED ORDER — ACETAMINOPHEN 325 MG PO TABS
650.0000 mg | ORAL_TABLET | Freq: Once | ORAL | Status: AC
  Administered 2022-04-11: 650 mg via ORAL
  Filled 2022-04-11: qty 2

## 2022-04-11 NOTE — ED Triage Notes (Signed)
Pt states that she has been having ongoing paresthesia to L pinky finger. This morning when she woke up the numbness was throughout her arm, but has since resolved. Pt also c/o headache and tongue numbness. Pt breathing without any distress during triage.

## 2022-04-11 NOTE — ED Notes (Signed)
lwbs

## 2022-04-11 NOTE — ED Provider Triage Note (Signed)
Emergency Medicine Provider Triage Evaluation Note  Chelsea Burgess , a 26 y.o. female  was evaluated in triage.  Pt complains of 2 weeks of subjective numbness to the left little finger.  Yesterday noticed some numbness to the left arm from the elbow down, which is since resolved.  Also headache started, however has still remained.  Earlier today also noticed some numbness/tingling to the tip of her tongue, which is mostly resolved.  Denies fevers, chills, neck stiffness, shortness of breath, chest pain, vision changes, or recent URI symptoms.  No recent head trauma or injury.  Hx of marijuana use.  Review of Systems  Positive:  Negative: See above  Physical Exam  BP 114/72   Pulse 64   Temp 98.3 F (36.8 C) (Oral)   Resp 16   Ht 5\' 7"  (1.702 m)   Wt 62.6 kg   SpO2 100%   BMI 21.61 kg/m  Gen:   Awake, no distress   Resp:  Normal effort, equal chest rise MSK:   Moves extremities without difficulty  Other:  Chest non-TTP.  Gaze aligned appropriately.  PERRLA.  ABCs intact.  AAOx4.  Upper extremities appear neurovascularly intact.  No facial asymmetry.  Coordination and gait appears grossly intact.  Medical Decision Making  Medically screening exam initiated at 7:24 PM.  Appropriate orders placed.  Raynelle Fanning was informed that the remainder of the evaluation will be completed by another provider, this initial triage assessment does not replace that evaluation, and the importance of remaining in the ED until their evaluation is complete.  Tylenol ordered.   Prince Rome, PA-C 50/38/88 [id number]

## 2022-07-06 ENCOUNTER — Other Ambulatory Visit (HOSPITAL_COMMUNITY)
Admission: RE | Admit: 2022-07-06 | Discharge: 2022-07-06 | Disposition: A | Discharge status: Home / Self Care | Payer: Medicaid Other | Source: Ambulatory Visit | Attending: Obstetrics & Gynecology | Admitting: Obstetrics & Gynecology

## 2022-07-06 ENCOUNTER — Other Ambulatory Visit (INDEPENDENT_AMBULATORY_CARE_PROVIDER_SITE_OTHER): Payer: Medicaid Other | Admitting: *Deleted

## 2022-07-06 DIAGNOSIS — N898 Other specified noninflammatory disorders of vagina: Secondary | ICD-10-CM | POA: Diagnosis present

## 2022-07-06 DIAGNOSIS — R350 Frequency of micturition: Secondary | ICD-10-CM

## 2022-07-06 LAB — POCT URINALYSIS DIPSTICK
Blood, UA: NEGATIVE
Glucose, UA: NEGATIVE
Ketones, UA: NEGATIVE
Nitrite, UA: NEGATIVE
Protein, UA: POSITIVE — AB

## 2022-07-06 NOTE — Progress Notes (Signed)
   NURSE VISIT- UTI SYMPTOMS   SUBJECTIVE:  Chelsea Burgess is a 27 y.o. [phone] female here for UTI symptoms. She is a GYN patient. She reports  urinary frequency X 2 days . Pt also has vaginal irritation X 2 days.  OBJECTIVE:  There were no vitals taken for this visit.  Appears well, in no apparent distress  Results for orders placed or performed in visit on 07/06/22 (from the past 24 hour(s))  POCT Urinalysis Dipstick   Collection Time: 07/06/22 11:16 AM  Result Value Ref Range   Color, UA     Clarity, UA     Glucose, UA Negative Negative   Bilirubin, UA     Ketones, UA neg    Spec Grav, UA     Blood, UA neg    pH, UA     Protein, UA Positive (A) Negative   Urobilinogen, UA     Nitrite, UA neg    Leukocytes, UA Trace (A) Negative   Appearance     Odor      ASSESSMENT: GYN patient with UTI symptoms and negative nitrites  PLAN: Note routed to Derrek Monaco, AGNP   Rx sent by provider today: No Urine culture sent. CV swab sent for GC/CHL, trich, BV and yeast.  Call or return to clinic prn if these symptoms worsen or fail to improve as anticipated. Follow-up: as needed   Chelsea Burgess  07/06/2022 11:27 AM

## 2022-07-07 LAB — URINALYSIS, ROUTINE W REFLEX MICROSCOPIC
Bilirubin, UA: NEGATIVE
Glucose, UA: NEGATIVE
Ketones, UA: NEGATIVE
Leukocytes,UA: NEGATIVE
Nitrite, UA: NEGATIVE
RBC, UA: NEGATIVE
Specific Gravity, UA: 1.027 (ref 1.005–1.030)
Urobilinogen, Ur: 0.2 mg/dL (ref 0.2–1.0)
pH, UA: 6.5 (ref 5.0–7.5)

## 2022-07-08 LAB — CERVICOVAGINAL ANCILLARY ONLY
Bacterial Vaginitis (gardnerella): POSITIVE — AB
Candida Glabrata: NEGATIVE
Candida Vaginitis: NEGATIVE
Chlamydia: NEGATIVE
Comment: NEGATIVE
Comment: NEGATIVE
Comment: NEGATIVE
Comment: NEGATIVE
Comment: NEGATIVE
Comment: NORMAL
Neisseria Gonorrhea: NEGATIVE
Trichomonas: NEGATIVE

## 2022-07-08 LAB — URINE CULTURE

## 2022-07-09 ENCOUNTER — Other Ambulatory Visit: Payer: Self-pay | Admitting: Adult Health

## 2022-07-09 MED ORDER — METRONIDAZOLE 0.75 % VA GEL: 1.0000 | Freq: Every day | VAGINAL | 0 refills | Status: DC

## 2022-07-09 MED ORDER — METRONIDAZOLE 500 MG PO TABS: 500.0000 mg | ORAL_TABLET | Freq: Two times a day (BID) | ORAL | 0 refills | Status: DC

## 2022-07-09 NOTE — Progress Notes (Signed)
Rx metrogel  

## 2022-07-09 NOTE — Progress Notes (Signed)
+  BV on vaginal swab, will rx flagyl, no sex or alcohol while taking meds.

## 2022-07-13 ENCOUNTER — Ambulatory Visit: Payer: Medicaid Other | Admitting: Adult Health

## 2022-07-13 ENCOUNTER — Encounter: Payer: Self-pay | Admitting: Adult Health

## 2022-07-13 VITALS — BP 128/86 | HR 77 | Ht 67.0 in | Wt 134.5 lb

## 2022-07-13 DIAGNOSIS — B9689 Other specified bacterial agents as the cause of diseases classified elsewhere: Secondary | ICD-10-CM | POA: Diagnosis not present

## 2022-07-13 DIAGNOSIS — Z975 Presence of (intrauterine) contraceptive device: Secondary | ICD-10-CM

## 2022-07-13 DIAGNOSIS — N760 Acute vaginitis: Secondary | ICD-10-CM | POA: Diagnosis not present

## 2022-07-13 MED ORDER — METRONIDAZOLE 0.75 % VA GEL: VAGINAL | 2 refills | Status: DC

## 2022-07-13 NOTE — Progress Notes (Signed)
  Subjective:     Patient ID: Chelsea Burgess, female   DOB: [birth year], 27 y.o.   MRN: [id number]  HPI Bethzaida is a 27 year old black female,single, with SO, [id number] in complaining of recurrent BV.  Last pap was negative 04/13/20  PCP is Dr Redmond Pulling  Review of Systems Recurrent BV Reviewed past medical,surgical, social and family history. Reviewed medications and allergies.     Objective:   Physical Exam BP 128/86 (BP Location: Left Arm, Patient Position: Sitting, Cuff Size: Normal)   Pulse 77   Ht 5\' 7"  (1.702 m)   Wt 134 lb 8 oz (61 kg)   Breastfeeding No   BMI 21.07 kg/m     Skin warm and dry. Lungs: clear to ausculation bilaterally. Cardiovascular: regular rate and rhythm.  Declines pelvic, just talk Fall risk is low  Upstream - 07/13/22 1116       Pregnancy Intention Screening   Does the patient want to become pregnant in the next year? No    Does the patient's partner want to become pregnant in the next year? No    Would the patient like to discuss contraceptive options today? No      Contraception Wrap Up   Current Method IUD or IUS    End Method IUD or IUS             Assessment:     1. BV (bacterial vaginosis) Has recurrent BV, will try extended Metrogel Meds ordered this encounter  Medications   metroNIDAZOLE (METROGEL) 0.75 % vaginal gel    Sig: Use 1 applicator daily for 7 days then 1-2 x weekly for 1 month    Dispense:  70 g    Refill:  2    Order Specific Question:   Supervising Provider    Answer:   Tania Ade H [[id number]]    Review handout on BV  2. IUD (intrauterine device) in place Liletta placed 10/27/20 Gave handout on nexplanon    Plan:     Follow up in 4 weeks

## 2022-07-27 ENCOUNTER — Encounter: Payer: Self-pay | Admitting: Physician Assistant

## 2022-07-27 ENCOUNTER — Ambulatory Visit: Payer: Medicaid Other | Attending: Family Medicine | Admitting: Physician Assistant

## 2022-07-27 VITALS — BP 112/81 | HR 79 | Wt 133.4 lb

## 2022-07-27 DIAGNOSIS — G8929 Other chronic pain: Secondary | ICD-10-CM | POA: Diagnosis not present

## 2022-07-27 DIAGNOSIS — Z8249 Family history of ischemic heart disease and other diseases of the circulatory system: Secondary | ICD-10-CM

## 2022-07-27 DIAGNOSIS — M545 Low back pain, unspecified: Secondary | ICD-10-CM

## 2022-07-27 DIAGNOSIS — R079 Chest pain, unspecified: Secondary | ICD-10-CM | POA: Diagnosis not present

## 2022-07-27 MED ORDER — METHOCARBAMOL 500 MG PO TABS: 1000.0000 mg | ORAL_TABLET | Freq: Three times a day (TID) | ORAL | 0 refills | Status: DC | PRN

## 2022-07-27 MED ORDER — DICLOFENAC SODIUM 1 % EX GEL: 2.0000 g | Freq: Four times a day (QID) | CUTANEOUS | 4 refills | Status: DC

## 2022-07-27 MED ORDER — DICLOFENAC SODIUM 75 MG PO TBEC: 75.0000 mg | DELAYED_RELEASE_TABLET | Freq: Two times a day (BID) | ORAL | 0 refills | Status: DC

## 2022-07-27 NOTE — Patient Instructions (Signed)
Lumbosacral Strain Lumbosacral strain is an injury that causes pain in the lower back (lumbosacral spine). This injury usually happens from overstretching the muscles or ligaments along your spine. Ligaments are cord-like tissues that connect bones to other bones. A strain can affect one or more muscles or ligaments. What are the causes? This condition may be caused by: A hard, direct hit to the back. Overstretching the lower back muscles. This may result from: A fall. Lifting something heavy. Repetitive movements such as bending or crouching. What increases the risk? The following factors may make you more likely to develop this condition: Participating in sports or activities that involve: A sudden twist of the back. Pushing or pulling motions. Being overweight or obese. Having poor strength and flexibility, especially tight hamstrings or weak muscles in the back or abdomen. Having too much of a curve in the lower back. Having a pelvis that is tilted forward. What are the signs or symptoms? The main symptom of this condition is pain in the lower back, at the site of the strain. Pain may also be felt down one or both legs. How is this diagnosed? This condition is diagnosed based on your symptoms, your medical history, and a physical exam. During the physical exam, your health care provider may push on certain areas of your back to find the source of your pain. You may be asked to bend forward, backward, and side to side to check your pain and range of motion. You may also have imaging tests, such as X-rays and an MRI. How is this treated? This condition may be treated by: Applying heat and cold on the affected area. Taking medicines to help relieve pain and relax your muscles. Taking NSAIDs, such as ibuprofen, to help reduce swelling and discomfort. Doing stretching and strengthening exercises for your lower back. Symptoms usually improve within several weeks of treatment. However,  recovery time varies. When your symptoms improve, gradually return to your normal routine as soon as possible to reduce pain, avoid stiffness, and keep muscle strength. Follow these instructions at home: Medicines Take over-the-counter and prescription medicines only as told by your health care provider. Ask your health care provider if the medicine prescribed to you: Requires you to avoid driving or using heavy machinery. Can cause constipation. You may need to take these actions to prevent or treat constipation: Drink enough fluid to keep your urine pale yellow. Take over-the-counter or prescription medicines. Eat foods that are high in fiber, such as beans, whole grains, and fresh fruits and vegetables. Limit foods that are high in fat and processed sugars, such as fried or sweet foods. Managing pain, stiffness, and swelling     If directed, put ice on the injured area. To do this: Put ice in a plastic bag. Place a towel between your skin and the bag. Leave the ice on for 20 minutes, 2-3 times a day. If directed, apply heat on the affected area as often as told by your health care provider. Use the heat source that your health care provider recommends, such as a moist heat pack or a heating pad. Place a towel between your skin and the heat source. Leave the heat on for 20-30 minutes. Remove the heat if your skin turns bright red. This is especially important if you are unable to feel pain, heat, or cold. You may have a greater risk of getting burned. Activity Rest as told by your health care provider. Do not stay in bed. Staying in bed for  more than 1-2 days can delay your recovery. Return to your normal activities as told by your health care provider. Ask your health care provider what activities are safe for you. Avoid activities that take a lot of energy for as long as told by your health care provider. Do exercises as told by your health care provider. This includes stretching and  strengthening exercises. General instructions Sit up and stand up straight. Avoid leaning forward when you sit, or hunching over when you stand. Do not use any products that contain nicotine or tobacco, such as cigarettes, e-cigarettes, and chewing tobacco. If you need help quitting, ask your health care provider. Keep all follow-up visits as told by your health care provider. This is important. How is this prevented?  Use correct form when playing sports and lifting heavy objects. Use good posture when sitting and standing. Maintain a healthy weight. Sleep on a mattress with medium firmness to support your back. Do at least 150 minutes of moderate-intensity exercise each week, such as brisk walking or water aerobics. Try a form of exercise that takes stress off your back, such as swimming or stationary cycling. Maintain physical fitness, including: Strength. Flexibility. Contact a health care provider if: Your back pain does not improve after several weeks of treatment. Your symptoms get worse. Get help right away if: Your back pain is severe. You cannot stand or walk. You have difficulty controlling when you urinate or when you have a bowel movement. You feel nauseous or you vomit. Your feet or legs get very cold, turn pale, or look blue. You have numbness, tingling, weakness, or problems using your arms or legs. You develop any of the following: Shortness of breath. Dizziness. Pain in your legs. Weakness in your buttocks or legs. Summary Lumbosacral strain is an injury that causes pain in the lower back (lumbosacral spine). This injury usually happens from overstretching the muscles or ligaments along your spine. This condition may be caused by a direct hit to the lower back or by overstretching the lower back muscles. Symptoms usually improve within several weeks of treatment. This information is not intended to replace advice given to you by your health care provider. Make  sure you discuss any questions you have with your health care provider. Document Revised: 09/18/2021 Document Reviewed: 11/04/2018 Elsevier Patient Education  [place].  Nonspecific Chest Pain Chest pain can be caused by many different conditions. Some causes of chest pain can be life-threatening. These will require treatment right away. Serious causes of chest pain include: Heart attack. A tear in the body's main blood vessel. Redness and swelling (inflammation) around your heart. Blood clot in your lungs. Other causes of chest pain may not be so serious. These include: Heartburn. Anxiety or stress. Damage to bones or muscles in your chest. Lung infections. Chest pain can feel like: Pain or discomfort in your chest. Crushing, pressure, aching, or squeezing pain. Burning or tingling. Dull or sharp pain that is worse when you move, cough, or take a deep breath. Pain or discomfort that is also felt in your back, neck, jaw, shoulder, or arm, or pain that spreads to any of these areas. It is hard to know whether your pain is caused by something that is serious or something that is not so serious. So it is important to see your doctor right away if you have chest pain. Follow these instructions at home: Medicines Take over-the-counter and prescription medicines only as told by your doctor. If you were  prescribed an antibiotic medicine, take it as told by your doctor. Do not stop taking the antibiotic even if you start to feel better. Lifestyle  Rest as told by your doctor. Do not use any products that contain nicotine or tobacco, such as cigarettes, e-cigarettes, and chewing tobacco. If you need help quitting, ask your doctor. Do not drink alcohol. Make lifestyle changes as told by your doctor. These may include: Getting regular exercise. Ask your doctor what activities are safe for you. Eating a heart-healthy diet. A diet and nutrition specialist (dietitian) can help you to  learn healthy eating options. Staying at a healthy weight. Treating diabetes or high blood pressure, if needed. Lowering your stress. Activities such as yoga and relaxation techniques can help. General instructions Pay attention to any changes in your symptoms. Tell your doctor about them or any new symptoms. Avoid any activities that cause chest pain. Keep all follow-up visits as told by your doctor. This is important. You may need more testing if your chest pain does not go away. Contact a doctor if: Your chest pain does not go away. You feel depressed. You have a fever. Get help right away if: Your chest pain is worse. You have a cough that gets worse, or you cough up blood. You have very bad (severe) pain in your belly (abdomen). You pass out (faint). You have either of these for no clear reason: Sudden chest discomfort. Sudden discomfort in your arms, back, neck, or jaw. You have shortness of breath at any time. You suddenly start to sweat, or your skin gets clammy. You feel sick to your stomach (nauseous). You throw up (vomit). You suddenly feel lightheaded or dizzy. You feel very weak or tired. Your heart starts to beat fast, or it feels like it is skipping beats. These symptoms may be an emergency. Do not wait to see if the symptoms will go away. Get medical help right away. Call your local emergency services (911 in the [place] Do not drive yourself to the hospital. Summary Chest pain can be caused by many different conditions. The cause may be serious and need treatment right away. If you have chest pain, see your doctor right away. Follow your doctor's instructions for taking medicines and making lifestyle changes. Keep all follow-up visits as told by your doctor. This includes visits for any further testing if your chest pain does not go away. Be sure to know the signs that show that your condition has become worse. Get help right away if you have these symptoms. This  information is not intended to replace advice given to you by your health care provider. Make sure you discuss any questions you have with your health care provider. Document Revised: 08/25/2020 Document Reviewed: 08/25/2020 Elsevier Patient Education  [place].

## 2022-07-27 NOTE — Progress Notes (Signed)
Patient ID: Chelsea Burgess, female   DOB: [birth year], 27 y.o.   MRN: [id number]     Chelsea Burgess, is a 27 y.o. female  [id number]  [id number]  DOB - [birth year]  Chief Complaint  Patient presents with   Back Pain       Subjective:   Chelsea Burgess is a 27 y.o. female here today for chronic LBP that seems to be worsening.  Pain is really in the middle of her lower thoracic spine and lumbar/sacral area.  Xrays about 1 year ago unremarkable.  She made an appointment with a chiropractor.  First line treatments have not worked.  No radiating pain.  No paresthesias.  PT was not helpful.  She has also tried stretching and working out without relief.    She had chest pain a few nights ago that awakened her from sleep.  She called 911 and they did an EKG and checked her out and everything was normal.  They expressed that it was likely anxiety.  No further CP.  She does admit to starting school recently and having other changes in her life that may be causing her stress.   Dad with MI-died at 86-60.  No CP/pressure/sob/dizziness Mom MI at age 27. She would like to see a cardiologist  No problems updated.  ALLERGIES: No Known Allergies  PAST MEDICAL HISTORY: Past Medical History:  Diagnosis Date   Herpes    Medical history non-contributory     MEDICATIONS AT HOME: Prior to Admission medications   Medication Sig Start Date End Date Taking? Authorizing Provider  diclofenac (VOLTAREN) 75 MG EC tablet Take 1 tablet (75 mg total) by mouth 2 (two) times daily. Prn pain 07/27/22  Yes Argentina Donovan, PA-C  diclofenac Sodium (VOLTAREN) 1 % GEL Apply 2 g topically 4 (four) times daily. 07/27/22  Yes Argentina Donovan, PA-C  methocarbamol (ROBAXIN) 500 MG tablet Take 2 tablets (1,000 mg total) by mouth every 8 (eight) hours as needed for muscle spasms. 07/27/22  Yes Tashi Andujo, Dionne Bucy, PA-C  levonorgestrel (LILETTA, 52 MG,) 20.1 MCG/DAY IUD 1 each by Intrauterine route once.     [provider]  metroNIDAZOLE (METROGEL) 0.75 % vaginal gel Use 1 applicator daily for 7 days then 1-2 x weekly for 1 month Patient not taking: Reported on 07/27/2022 07/13/22   Derrek Monaco A, NP    ROS: Neg HEENT Neg resp Neg GI Neg GU Neg psych Neg neuro  Objective:   Vitals:   07/27/22 1037  BP: 112/81  Pulse: 79  SpO2: 96%  Weight: 133 lb 6.4 oz (60.5 kg)   Exam General appearance : Awake, alert, not in any distress. Speech Clear. Not toxic looking HEENT: Atraumatic and Normocephalic Neck: Supple, no JVD. No cervical lymphadenopathy.  Chest: Good air entry bilaterally, CTAB.  No rales/rhonchi/wheezing CVS: S1 S2 regular, no murmurs. No change with valsalva maneuvers or leaning forward or back Low back and lower thoracic back-no spiny TTP.  +lumbar spasm.  Neg SLR B.  DTR <1 B but =.   Extremities: B/L Lower Ext shows no edema, both legs are warm to touch Neurology: Awake alert, and oriented X 3, CN II-XII intact, Non focal Skin: No Rash  Data Review No results found for: "HGBA1C"  Assessment & Plan   1. Family history of heart attack - Ambulatory referral to Cardiology  2. Chronic bilateral low back pain without sciatica No red flags - diclofenac (VOLTAREN) 75 MG EC tablet; Take 1 tablet (75  mg total) by mouth 2 (two) times daily. Prn pain  Dispense: 60 tablet; Refill: 0 - Ambulatory referral to Orthopedic Surgery - diclofenac Sodium (VOLTAREN) 1 % GEL; Apply 2 g topically 4 (four) times daily.  Dispense: 100 g; Refill: 4 - methocarbamol (ROBAXIN) 500 MG tablet; Take 2 tablets (1,000 mg total) by mouth every 8 (eight) hours as needed for muscle spasms.  Dispense: 90 tablet; Refill: 0  3. Chest pain, unspecified type No CP currently.  Cleared via EMS when she called them.   - Ambulatory referral to Cardiology CP warnings discussed and patient verbalized understanding.     Return in about 3 months (around 10/25/2022), or if symptoms worsen or fail to  improve.  The patient was given clear instructions to go to ER or return to medical center if symptoms don't improve, worsen or new problems develop. The patient verbalized understanding. The patient was told to call to get lab results if they haven't heard anything in the next week.      Freeman Caldron, PA-C [facility] [place], [place]   07/27/2022, 10:59 AM

## 2022-08-03 ENCOUNTER — Ambulatory Visit: Payer: Medicaid Other | Attending: Cardiovascular Disease | Admitting: Cardiovascular Disease

## 2022-08-03 ENCOUNTER — Encounter: Payer: Self-pay | Admitting: Cardiovascular Disease

## 2022-08-03 VITALS — BP 104/68 | HR 67 | Ht 67.0 in | Wt 136.0 lb

## 2022-08-03 DIAGNOSIS — R079 Chest pain, unspecified: Secondary | ICD-10-CM

## 2022-08-03 DIAGNOSIS — Z8249 Family history of ischemic heart disease and other diseases of the circulatory system: Secondary | ICD-10-CM

## 2022-08-03 NOTE — Patient Instructions (Signed)
Medication Instructions:  Your physician recommends that you continue on your current medications as directed. Please refer to the Current Medication list given to you today.  *If you need a refill on your cardiac medications before your next appointment, please call your pharmacy*   Lab Work: Lipids, LP(a) today If you have labs (blood work) drawn today and your tests are completely normal, you will receive your results only by: [place] (if you have MyChart) OR A paper copy in the mail If you have any lab test that is abnormal or we need to change your treatment, we will call you to review the results.   Testing/Procedures: NONE   Follow-Up: At [facility], you and your health needs are our priority.  As part of our continuing mission to provide you with exceptional heart care, we have created designated Provider Care Teams.  These Care Teams include your primary Cardiologist (physician) and Advanced Practice Providers (APPs -  Physician Assistants and Nurse Practitioners) who all work together to provide you with the care you need, when you need it.  We recommend signing up for the patient portal called "MyChart".  Sign up information is provided on this After Visit Summary.  MyChart is used to connect with patients for Virtual Visits (Telemedicine).  Patients are able to view lab/test results, encounter notes, upcoming appointments, etc.  Non-urgent messages can be sent to your provider as well.   To learn more about what you can do with MyChart, go to [email or web address].    Your next appointment:   As Needed  Provider:   Mertie Moores, MD

## 2022-08-03 NOTE — Progress Notes (Signed)
Cardiology Office Note:    Date:  08/03/2022   ID:  Chelsea Burgess, DOB 04-10-1996, MRN [id number]  PCP:  Chelsea Burgess, [place] Providers Cardiologist:  Chelsea Burgess  Click to update primary MD,subspecialty MD or APP then REFRESH:1}    Referring MD: Chelsea Donovan, PA-C   No chief complaint on file.   History of Present Illness:    Chelsea Burgess is a 27 y.o. female with a hx of chest pains Has a family hx of CAD   Seen with her 79 yo son, Chelsea Burgess .   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  Rare ETOH .      Past Medical History:  Diagnosis Date   Back pain    Family history of heart attack    Herpes    Medical history non-contributory     Past Surgical History:  Procedure Laterality Date   NO PAST SURGERIES      Current Medications: Current Meds  Medication Sig   diclofenac (VOLTAREN) 75 MG EC tablet Take 1 tablet (75 mg total) by mouth 2 (two) times daily. Prn pain   diclofenac Sodium (VOLTAREN) 1 % GEL Apply 2 g topically 4 (four) times daily.   levonorgestrel (LILETTA, 52 MG,) 20.1 MCG/DAY IUD 1 each by Intrauterine route once.   metroNIDAZOLE (METROGEL) 0.75 % vaginal gel Use 1 applicator daily for 7 days then 1-2 x weekly for 1 month (Patient taking differently: as needed (yeast infection). Use 1 applicator daily for 7 days then 1-2 x weekly for 1 month)     Allergies:   Patient has no known allergies.   Social History   Socioeconomic History   Marital status: Significant Other    Spouse name: Not on file   Number of children: 1   Years of education: Not on file   Highest education level: Not on file  Occupational History   Not  on file  Tobacco Use   Smoking status: Never   Smokeless tobacco: Never  Vaping Use   Vaping Use: Never used  Substance and Sexual Activity   Alcohol use: Not Currently    Comment: occ   Drug use: Never   Sexual activity: Not Currently    Birth control/protection: I.U.D.  Other Topics Concern   Not on file  Social History Narrative   ** Merged History Encounter **       ** Merged History Encounter **       Social Determinants of Health   Financial Resource Strain: Low Risk  (07/04/2020)   Overall Financial Resource Strain (CARDIA)    Difficulty of Paying Living Expenses: Not very hard  Food Insecurity: No Food Insecurity (07/04/2020)   Hunger Vital Sign    Worried About Running Out of Food in the Last Year: Never true    Ran Out of Food in the Last Year: Never true  Transportation Needs: No Transportation Needs (07/04/2020)   PRAPARE - Transportation    Lack of Transportation (Medical): No    Lack of  Transportation (Non-Medical): No  Physical Activity: Insufficiently Active (07/04/2020)   Exercise Vital Sign    Days of Exercise per Week: 1 day    Minutes of Exercise per Session: 10 min  Stress: No Stress Concern Present (07/04/2020)   [place]    Feeling of Stress : Not at all  Social Connections: Moderately Integrated (07/04/2020)   Social Connection and Isolation Panel [NHANES]    Frequency of Communication with Friends and Family: More than three times a week    Frequency of Social Gatherings with Friends and Family: Twice a week    Attends Religious Services: More than 4 times per year    Active Member of [employer] or Organizations: No    Attends Music therapist: Never    Marital Status: Living with partner     Family History: The patient's family history includes Cancer in her maternal grandfather; Diabetes in her maternal grandmother; Heart attack in her father and mother; Stroke in her  maternal aunt and mother.  ROS:   Please see the history of present illness.     All other systems reviewed and are negative.  EKGs/Labs/Other Studies Reviewed:    The following studies were reviewed today:   EKG:    Feb. 9, 2024:  NSR with short PR.  No ST or T wave changes.   Recent Labs: 04/11/2022: BUN 12; Creatinine, Ser 0.70; Hemoglobin 14.0; Platelets 227; Potassium 4.0; Sodium 139  Recent Lipid Panel No results found for: "CHOL", "TRIG", "HDL", "CHOLHDL", "VLDL", "LDLCALC", "LDLDIRECT"   Risk Assessment/Calculations:                Physical Exam:    VS:  BP 104/68   Pulse 67   Ht 5' 7"$  (1.702 m)   Wt 136 lb (61.7 kg)   SpO2 99%   BMI 21.30 kg/m     Wt Readings from Last 3 Encounters:  08/03/22 136 lb (61.7 kg)  07/27/22 133 lb 6.4 oz (60.5 kg)  07/13/22 134 lb 8 oz (61 kg)     GEN:  Well nourished, well developed in no acute distress HEENT: Normal NECK: No JVD; No carotid bruits LYMPHATICS: No lymphadenopathy CARDIAC: RRR, no murmurs, rubs, gallops RESPIRATORY:  Clear to auscultation without rales, wheezing or rhonchi  ABDOMEN: Soft, non-tender, non-distended MUSCULOSKELETAL:  No edema; No deformity  SKIN: Warm and dry NEUROLOGIC:  Alert and oriented x 3 PSYCHIATRIC:  Normal affect   ASSESSMENT:    1. Chest pain of uncertain etiology   2. Family history of coronary artery disease    PLAN:    In order of problems listed above:  Chest pain : Patient has a family history of coronary artery disease.  She does not smoke.  She exercises regularly.  She had 1 episode of chest pain and called EMS.  She was evaluated at her house.  EKG was unremarkable.  She did not go to the hospital.  She later saw her primary medical doctor who referred her here for further evaluation.   Coronary exercises on a regular basis and has never had episodes of chest discomfort.  I think the likelihood of her having obstructive coronary artery disease at age 51 is very  low.  I do think it is worthwhile to draw lipid levels as well as a lipoprotein a.  This will help [place] risk assess her.  She may need to make some dietary changes if these levels are high.  I doubt that she will need any lipid-lowering medication at this time.  Will see her as needed.            Medication Adjustments/Labs and Tests Ordered: Current medicines are reviewed at length with the patient today.  Concerns regarding medicines are outlined above.  Orders Placed This Encounter  Procedures   Lipid panel   Lipoprotein A (LPA)   EKG 12-Lead   No orders of the defined types were placed in this encounter.   Patient Instructions  Medication Instructions:  Your physician recommends that you continue on your current medications as directed. Please refer to the Current Medication list given to you today.  *If you need a refill on your cardiac medications before your next appointment, please call your pharmacy*   Lab Work: Lipids, LP(a) today If you have labs (blood work) drawn today and your tests are completely normal, you will receive your results only by: [place] (if you have MyChart) OR A paper copy in the mail If you have any lab test that is abnormal or we need to change your treatment, we will call you to review the results.   Testing/Procedures: NONE   Follow-Up: At [facility], you and your health needs are our priority.  As part of our continuing mission to provide you with exceptional heart care, we have created designated Provider Care Teams.  These Care Teams include your primary Cardiologist (physician) and Advanced Practice Providers (APPs -  Physician Assistants and Nurse Practitioners) who all work together to provide you with the care you need, when you need it.  We recommend signing up for the patient portal called "MyChart".  Sign up information is provided on this After Visit Summary.  MyChart is used to connect with patients for Virtual  Visits (Telemedicine).  Patients are able to view lab/test results, encounter notes, upcoming appointments, etc.  Non-urgent messages can be sent to your provider as well.   To learn more about what you can do with MyChart, go to [email or web address].    Your next appointment:   As Needed  Provider:   Mertie Moores, MD      Signed, Mertie Moores, MD  08/03/2022 9:39 AM    [place]

## 2022-08-04 LAB — LIPID PANEL
Chol/HDL Ratio: 3 ratio (ref 0.0–4.4)
Cholesterol, Total: 128 mg/dL (ref 100–199)
HDL: 43 mg/dL (ref >39)
LDL Chol Calc (NIH): 71 mg/dL (ref 0–99)
Triglycerides: 66 mg/dL (ref 0–149)
VLDL Cholesterol Cal: 14 mg/dL (ref 5–40)

## 2022-08-04 LAB — LIPOPROTEIN A (LPA): Lipoprotein (a): 181.8 nmol/L — ABNORMAL HIGH (ref <75.0)

## 2022-08-10 ENCOUNTER — Ambulatory Visit: Payer: Medicaid Other | Admitting: Adult Health

## 2022-08-30 ENCOUNTER — Other Ambulatory Visit: Payer: Self-pay | Admitting: Physician Assistant

## 2022-08-30 ENCOUNTER — Other Ambulatory Visit: Payer: Self-pay | Admitting: Family Medicine

## 2022-08-30 DIAGNOSIS — M545 Low back pain, unspecified: Secondary | ICD-10-CM

## 2022-08-30 NOTE — Telephone Encounter (Signed)
Requested medication (s) are due for refill today: na   Requested medication (s) are on the active medication list: yes   Last refill:  08/30/22 #180 0 refills  Future visit scheduled: no   Notes to clinic:  Pharmacy comment: Alternative Requested:DRUG NOT COVERED.   Please advise     Requested Prescriptions  Pending Prescriptions Disp Refills   diclofenac (VOLTAREN) 75 MG EC tablet [Pharmacy Med Name: DICLOFENAC SOD EC 75 MG TAB] 180 tablet 0    Sig: TAKE 1 TABLET BY MOUTH 2 TIMES DAILY AS NEEDED FOR PAIN.     Analgesics:  NSAIDS Failed - 08/30/2022 12:40 PM      Failed - Manual Review: Labs are only required if the patient has taken medication for more than 8 weeks.      Passed - Cr in normal range and within 360 days    Creatinine, Ser  Date Value Ref Range Status  04/11/2022 0.70 0.44 - 1.00 mg/dL Final         Passed - HGB in normal range and within 360 days    Hemoglobin  Date Value Ref Range Status  04/11/2022 14.0 12.0 - 15.0 g/dL Final  07/04/2020 11.0 (L) 11.1 - 15.9 g/dL Final         Passed - PLT in normal range and within 360 days    Platelets  Date Value Ref Range Status  04/11/2022 227 150 - 400 K/uL Final  07/04/2020 217 150 - 450 x10E3/uL Final         Passed - HCT in normal range and within 360 days    HCT  Date Value Ref Range Status  04/11/2022 42.5 36.0 - 46.0 % Final   Hematocrit  Date Value Ref Range Status  07/04/2020 32.5 (L) 34.0 - 46.6 % Final         Passed - eGFR is 30 or above and within 360 days    GFR calc Af Amer  Date Value Ref Range Status  01/18/2019 >60 >60 mL/min Final   GFR, Estimated  Date Value Ref Range Status  04/11/2022 >60 >60 mL/min Final    Comment:    (NOTE) Calculated using the CKD-EPI Creatinine Equation (2021)          Passed - Patient is not pregnant      Passed - Valid encounter within last 12 months    Recent Outpatient Visits           1 month ago Family history of heart attack   [place], [place]   11 months ago Chronic bilateral thoracic back pain   [place] [facility], [place]   1 year ago Chronic bilateral low back pain without sciatica   [facility], [place], [place]

## 2022-08-30 NOTE — Telephone Encounter (Signed)
Requested Prescriptions  Pending Prescriptions Disp Refills   diclofenac (VOLTAREN) 75 MG EC tablet [Pharmacy Med Name: DICLOFENAC SOD EC 75 MG TAB] 180 tablet 0    Sig: TAKE 1 TABLET (75 MG TOTAL) BY MOUTH 2 (TWO) TIMES DAILY AS NEEDED FOR PAIN     Analgesics:  NSAIDS Failed - 08/30/2022  2:00 AM      Failed - Manual Review: Labs are only required if the patient has taken medication for more than 8 weeks.      Passed - Cr in normal range and within 360 days    Creatinine, Ser  Date Value Ref Range Status  04/11/2022 0.70 0.44 - 1.00 mg/dL Final         Passed - HGB in normal range and within 360 days    Hemoglobin  Date Value Ref Range Status  04/11/2022 14.0 12.0 - 15.0 g/dL Final  07/04/2020 11.0 (L) 11.1 - 15.9 g/dL Final         Passed - PLT in normal range and within 360 days    Platelets  Date Value Ref Range Status  04/11/2022 227 150 - 400 K/uL Final  07/04/2020 217 150 - 450 x10E3/uL Final         Passed - HCT in normal range and within 360 days    HCT  Date Value Ref Range Status  04/11/2022 42.5 36.0 - 46.0 % Final   Hematocrit  Date Value Ref Range Status  07/04/2020 32.5 (L) 34.0 - 46.6 % Final         Passed - eGFR is 30 or above and within 360 days    GFR calc Af Amer  Date Value Ref Range Status  01/18/2019 >60 >60 mL/min Final   GFR, Estimated  Date Value Ref Range Status  04/11/2022 >60 >60 mL/min Final    Comment:    (NOTE) Calculated using the CKD-EPI Creatinine Equation (2021)          Passed - Patient is not pregnant      Passed - Valid encounter within last 12 months    Recent Outpatient Visits           1 month ago Family history of heart attack   [place], [place]   11 months ago Chronic bilateral thoracic back pain   [place] [facility], [place]   1 year ago Chronic bilateral low back pain without sciatica   [facility], [place], [place]

## 2022-09-06 ENCOUNTER — Other Ambulatory Visit: Payer: Self-pay | Admitting: Family Medicine

## 2022-09-06 MED ORDER — NAPROXEN 500 MG PO TABS: 500.0000 mg | ORAL_TABLET | Freq: Two times a day (BID) | ORAL | 3 refills | Status: DC

## 2023-02-07 ENCOUNTER — Ambulatory Visit (INDEPENDENT_AMBULATORY_CARE_PROVIDER_SITE_OTHER): Payer: Medicaid Other | Admitting: Family Medicine

## 2023-02-07 ENCOUNTER — Other Ambulatory Visit (HOSPITAL_COMMUNITY)
Admission: RE | Admit: 2023-02-07 | Discharge: 2023-02-07 | Disposition: A | Discharge status: Home / Self Care | Payer: Medicaid Other | Source: Ambulatory Visit | Attending: Family Medicine | Admitting: Family Medicine

## 2023-02-07 ENCOUNTER — Encounter: Payer: Self-pay | Admitting: Family Medicine

## 2023-02-07 VITALS — BP 120/81 | HR 67 | Temp 98.1°F | Resp 16 | Ht 67.0 in | Wt 142.0 lb

## 2023-02-07 DIAGNOSIS — M546 Pain in thoracic spine: Secondary | ICD-10-CM | POA: Diagnosis present

## 2023-02-07 DIAGNOSIS — G8929 Other chronic pain: Secondary | ICD-10-CM

## 2023-02-07 DIAGNOSIS — Z202 Contact with and (suspected) exposure to infections with a predominantly sexual mode of transmission: Secondary | ICD-10-CM | POA: Insufficient documentation

## 2023-02-07 DIAGNOSIS — M545 Low back pain, unspecified: Secondary | ICD-10-CM

## 2023-02-07 MED ORDER — CYCLOBENZAPRINE HCL 10 MG PO TABS: 10.0000 mg | ORAL_TABLET | Freq: Every day | ORAL | 0 refills | Status: DC

## 2023-02-08 LAB — CERVICOVAGINAL ANCILLARY ONLY
Bacterial Vaginitis (gardnerella): POSITIVE — AB
Candida Glabrata: NEGATIVE
Candida Vaginitis: NEGATIVE
Chlamydia: NEGATIVE
Comment: NEGATIVE
Comment: NEGATIVE
Comment: NEGATIVE
Comment: NEGATIVE
Comment: NEGATIVE
Comment: NORMAL
Neisseria Gonorrhea: NEGATIVE
Trichomonas: NEGATIVE

## 2023-02-11 ENCOUNTER — Encounter: Payer: Self-pay | Admitting: Family Medicine

## 2023-02-11 ENCOUNTER — Other Ambulatory Visit: Payer: Self-pay | Admitting: Family Medicine

## 2023-02-11 MED ORDER — METRONIDAZOLE 500 MG PO TABS: 500.0000 mg | ORAL_TABLET | Freq: Two times a day (BID) | ORAL | 0 refills | Status: AC

## 2023-02-11 NOTE — Progress Notes (Signed)
Established Patient Office Visit  Subjective    Patient ID: Chelsea Burgess, female    DOB: Aug 10, 1995  Age: 27 y.o. MRN: [id number]  CC:  Chief Complaint  Patient presents with   Exposure to STD    back   Back Pain    HPI Chelsea Burgess presents with complaint of possible STD exposure ad also with complaint of persistent back pain.    Outpatient Encounter Medications as of 02/07/2023  Medication Sig   cyclobenzaprine (FLEXERIL) 10 MG tablet Take 1 tablet (10 mg total) by mouth at bedtime.   naproxen (NAPROSYN) 500 MG tablet Take 1 tablet (500 mg total) by mouth 2 (two) times daily with a meal. (Patient not taking: Reported on 02/07/2023)   diclofenac (VOLTAREN) 75 MG EC tablet TAKE 1 TABLET (75 MG TOTAL) BY MOUTH 2 (TWO) TIMES DAILY AS NEEDED FOR PAIN (Patient not taking: Reported on 02/07/2023)   diclofenac Sodium (VOLTAREN) 1 % GEL Apply 2 g topically 4 (four) times daily. (Patient not taking: Reported on 02/07/2023)   levonorgestrel (LILETTA, 52 MG,) 20.1 MCG/DAY IUD 1 each by Intrauterine route once. (Patient not taking: Reported on 02/07/2023)   metroNIDAZOLE (METROGEL) 0.75 % vaginal gel Use 1 applicator daily for 7 days then 1-2 x weekly for 1 month (Patient not taking: Reported on 02/07/2023)   No facility-administered encounter medications on file as of 02/07/2023.    Past Medical History:  Diagnosis Date   Back pain    Family history of heart attack    Herpes    Medical history non-contributory     Past Surgical History:  Procedure Laterality Date   NO PAST SURGERIES      Family History  Problem Relation Age of Onset   Diabetes Maternal Grandmother    Cancer Maternal Grandfather        colon   Heart attack Father    Stroke Mother    Heart attack Mother    Stroke Maternal Aunt     Social History   Socioeconomic History   Marital status: Significant Other    Spouse name: Not on file   Number of children: 1   Years of education: Not on file    Highest education level: Not on file  Occupational History   Not on file  Tobacco Use   Smoking status: Never   Smokeless tobacco: Never  Vaping Use   Vaping status: Never Used  Substance and Sexual Activity   Alcohol use: Not Currently    Comment: occ   Drug use: Never   Sexual activity: Not Currently    Birth control/protection: I.U.D.  Other Topics Concern   Not on file  Social History Narrative   ** Merged History Encounter **       ** Merged History Encounter **       Social Determinants of Health   Financial Resource Strain: Low Risk  (07/04/2020)   Overall Financial Resource Strain (CARDIA)    Difficulty of Paying Living Expenses: Not very hard  Food Insecurity: No Food Insecurity (07/04/2020)   Hunger Vital Sign    Worried About Running Out of Food in the Last Year: Never true    Ran Out of Food in the Last Year: Never true  Transportation Needs: No Transportation Needs (07/04/2020)   PRAPARE - Administrator, Civil Service (Medical): No    Lack of Transportation (Non-Medical): No  Physical Activity: Insufficiently Active (07/04/2020)   Exercise Vital Sign  Days of Exercise per Week: 1 day    Minutes of Exercise per Session: 10 min  Stress: No Stress Concern Present (07/04/2020)   [employer] of Occupational Health - Occupational Stress Questionnaire    Feeling of Stress : Not at all  Social Connections: Moderately Integrated (07/04/2020)   Social Connection and Isolation Panel [NHANES]    Frequency of Communication with Friends and Family: More than three times a week    Frequency of Social Gatherings with Friends and Family: Twice a week    Attends Religious Services: More than 4 times per year    Active Member of [employer] or Organizations: No    Attends Banker Meetings: Never    Marital Status: Living with partner  Intimate Partner Violence: Not At Risk (07/04/2020)   Humiliation, Afraid, Rape, and Kick questionnaire    Fear of  Current or Ex-Partner: No    Emotionally Abused: No    Physically Abused: No    Sexually Abused: No    Review of Systems  Musculoskeletal:  Positive for back pain.  All other systems reviewed and are negative.       Objective    BP 120/81   Pulse 67   Temp 98.1 F (36.7 C) (Oral)   Resp 16   Ht 5\' 7"  (1.702 m)   Wt 142 lb (64.4 kg)   SpO2 98%   BMI 22.24 kg/m   Physical Exam Vitals and nursing note reviewed.  Constitutional:      General: She is not in acute distress. Cardiovascular:     Rate and Rhythm: Normal rate and regular rhythm.  Pulmonary:     Effort: Pulmonary effort is normal.     Breath sounds: Normal breath sounds.  Abdominal:     Palpations: Abdomen is soft.     Tenderness: There is no abdominal tenderness.  Musculoskeletal:     Lumbar back: Tenderness present.  Neurological:     General: No focal deficit present.     Mental Status: She is alert and oriented to person, place, and time.         Assessment & Plan:   1. Exposure to STD Cultures pending.  - Cervicovaginal ancillary only  2. Chronic bilateral low back pain without sciatica Referral to ortho for further eval/mgt. Flexeril prescribed.  - Cervicovaginal ancillary only - Ambulatory referral to Orthopedics  3. Chronic bilateral thoracic back pain As above - Cervicovaginal ancillary only - Ambulatory referral to Orthopedics    Return if symptoms worsen or fail to improve.   Tommie Raymond, MD

## 2023-03-05 ENCOUNTER — Ambulatory Visit (INDEPENDENT_AMBULATORY_CARE_PROVIDER_SITE_OTHER): Payer: Medicaid Other | Admitting: Orthopaedic Surgery

## 2023-03-05 VITALS — BP 119/83 | HR 59

## 2023-03-05 DIAGNOSIS — G8929 Other chronic pain: Secondary | ICD-10-CM

## 2023-03-05 DIAGNOSIS — M545 Low back pain, unspecified: Secondary | ICD-10-CM | POA: Diagnosis not present

## 2023-03-05 NOTE — Progress Notes (Signed)
Office Visit Note   Patient: Chelsea Burgess           Date of Birth: 04/28/1996           MRN: [id number] Visit Date: 03/05/2023              Requested by: Georganna Skeans, MD [address] [place],  [place] [address] PCP: Georganna Skeans, MD   Assessment & Plan: Visit Diagnoses:  1. Chronic bilateral low back pain without sciatica     Plan: Last physical therapy was over a year ago will set him up for core strengthening home exercise program.  She has been doing some exercise she found on the Internet some of them tend to make her pain worse.  I will check her back in 6 weeks to check on her progress after therapy.  Follow-Up Instructions: No follow-ups on file.   Orders:  No orders of the defined types were placed in this encounter.  No orders of the defined types were placed in this encounter.     Procedures: No procedures performed   Clinical Data: No additional findings.   Subjective: Chief Complaint  Patient presents with   Lower Back - Follow-up, Pain    HPI 27 year old female mother of 2 with onset of back pain after the birth of her child which was in March 2021.  She has had back pain been through physical therapy finished March 2023 she is currently seeing a [facility].  She has been treated with prednisone, muscle relaxants, anti-inflammatories, home exercise program.  Pain is sometimes upper lumbar occasionally lower lumbar.  Sometimes she has some radicular symptoms.  She states 1 her husband massages her squeezes the area sometimes gives her some stomach discomfort.  No fever chills no bowel bladder symptoms.  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  No disc base narrowing no degenerative changes.  Review of Systems all other systems noncontributory to HPI.   Objective: Vital Signs: BP 119/83   Pulse (!) 59   Physical Exam Constitutional:      Appearance: She is well-developed.  HENT:     Head:  Normocephalic.     Right Ear: External ear normal.     Left Ear: External ear normal. There is no impacted cerumen.  Eyes:     Pupils: Pupils are equal, round, and reactive to light.  Neck:     Thyroid: No thyromegaly.     Trachea: No tracheal deviation.  Cardiovascular:     Rate and Rhythm: Normal rate.  Pulmonary:     Effort: Pulmonary effort is normal.  Abdominal:     Palpations: Abdomen is soft.  Musculoskeletal:     Cervical back: No rigidity.  Skin:    General: Skin is warm and dry.  Neurological:     Mental Status: She is alert and oriented to person, place, and time.  Psychiatric:        Behavior: Behavior normal.     Ortho Exam negative logroll the hips.  Station is intact mild tenderness lumbosacral junction lower lumbar region no tenderness over L5 spinous process SI joints are normal no sciatic notch tenderness trochanteric bursa is nontender forward flexion fingertips to her ankles.  Normal heel-toe walking.  Specialty Comments:  No specialty comments available.  Imaging: No results found.   PMFS History: Patient Active Problem List   Diagnosis Date Noted   BV (bacterial vaginosis) 07/13/2022   Pelvic pain 12/22/2021   Vaginal discharge  12/22/2021   Chronic bilateral low back pain without sciatica 08/22/2021   Chronic bilateral thoracic back pain 08/22/2021   Vaginal bleeding 01/02/2021   IUD check up 01/02/2021   Screen for STD (sexually transmitted disease) 01/02/2021   IUD (intrauterine device) in place 09/22/2020   Post term pregnancy over 40 weeks 09/21/2020   Uterine size-date discrepancy in third trimester 09/08/2020   History of herpes simplex infection 05/25/2020   Recurrent UTI (urinary tract infection) complicating pregnancy 03/21/2020   Chlamydia 03/21/2020   Marijuana use 03/16/2020   Rh negative status during pregnancy 03/14/2020   Encounter for supervision of normal pregnancy, antepartum 03/08/2020   Abnormal Pap smear of cervix  06/27/2018   Past Medical History:  Diagnosis Date   Back pain    Family history of heart attack    Herpes    Medical history non-contributory     Family History  Problem Relation Age of Onset   Diabetes Maternal Grandmother    Cancer Maternal Grandfather        colon   Heart attack Father    Stroke Mother    Heart attack Mother    Stroke Maternal Aunt     Past Surgical History:  Procedure Laterality Date   NO PAST SURGERIES     Social History   Occupational History   Not on file  Tobacco Use   Smoking status: Never   Smokeless tobacco: Never  Vaping Use   Vaping status: Never Used  Substance and Sexual Activity   Alcohol use: Not Currently    Comment: occ   Drug use: Never   Sexual activity: Not Currently    Birth control/protection: I.U.D.

## 2023-03-05 NOTE — Addendum Note (Signed)
Addended by: Hans Eden on: 03/05/2023 09:39 AM   Modules accepted: Orders

## 2023-03-23 NOTE — Therapy (Signed)
OUTPATIENT PHYSICAL THERAPY THORACOLUMBAR EVALUATION   Patient Name: Chelsea Burgess MRN: [id number] DOB:[birth year], 27 y.o., female Today's Date: 03/23/2023  END OF SESSION:   Past Medical History:  Diagnosis Date   Back pain    Family history of heart attack    Herpes    Medical history non-contributory    Past Surgical History:  Procedure Laterality Date   NO PAST SURGERIES     Patient Active Problem List   Diagnosis Date Noted   BV (bacterial vaginosis) 07/13/2022   Pelvic pain 12/22/2021   Vaginal discharge 12/22/2021   Chronic bilateral low back pain without sciatica 08/22/2021   Chronic bilateral thoracic back pain 08/22/2021   Vaginal bleeding 01/02/2021   IUD check up 01/02/2021   Screen for STD (sexually transmitted disease) 01/02/2021   IUD (intrauterine device) in place 09/22/2020   Post term pregnancy over 40 weeks 09/21/2020   Uterine size-date discrepancy in third trimester 09/08/2020   History of herpes simplex infection 05/25/2020   Recurrent UTI (urinary tract infection) complicating pregnancy 03/21/2020   Chlamydia 03/21/2020   Marijuana use 03/16/2020   Rh negative status during pregnancy 03/14/2020   Encounter for supervision of normal pregnancy, antepartum 03/08/2020   Abnormal Pap smear of cervix 06/27/2018    PCP: Georganna Skeans, MD   REFERRING PROVIDER: Eldred Manges, MD   REFERRING DIAG: Chronic bilateral low back pain without sciatica [M54.50, G89.29]   Rationale for Evaluation and Treatment: Rehabilitation  THERAPY DIAG:  No diagnosis found.  ONSET DATE: ***  SUBJECTIVE:                                                                                                                                                                                           SUBJECTIVE STATEMENT: ***HPI 27 year old female mother of 2 with onset of back pain after the birth of her child which was in March 2021.  She has had back pain been through  physical therapy finished March 2023 she is currently seeing a [facility].  She has been treated with prednisone, muscle relaxants, anti-inflammatories, home exercise program.  Pain is sometimes upper lumbar occasionally lower lumbar.  Sometimes she has some radicular symptoms.  She states 1 her husband massages her squeezes the area sometimes gives her some stomach discomfort.  No fever chills no bowel bladder symptoms.  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  No disc base narrowing no degenerative changes.   PERTINENT HISTORY:  *** 1. Chronic bilateral low back pain without sciatica       Plan: Last physical therapy was over a year ago will set him up  for core strengthening home exercise program.  She has been doing some exercise she found on the Internet some of them tend to make her pain worse.  I will check her back in 6 weeks to check on her progress after therapy.  PAIN:  Are you having pain? Yes: NPRS scale: ***/10 Pain location: *** Pain description: *** Aggravating factors: *** Relieving factors: ***  PRECAUTIONS: {Therapy precautions:24002}  RED FLAGS: {PT Red Flags:29287}   WEIGHT BEARING RESTRICTIONS: {Yes ***/No:24003}  FALLS:  Has patient fallen in last 6 months? {fallsyesno:27318}  LIVING ENVIRONMENT: Lives with: {OPRC lives with:25569::"lives with their family"} Lives in: {Lives in:25570} Stairs: {opstairs:27293} Has following equipment at home: {Assistive devices:23999}  OCCUPATION: ***  PLOF: {PLOF:24004}  PATIENT GOALS: ***  NEXT MD VISIT: ***  OBJECTIVE:   DIAGNOSTIC FINDINGS:  ***  PATIENT SURVEYS:  {rehab surveys:24030}  SCREENING FOR RED FLAGS: Bowel or bladder incontinence: {Yes/No:304960894} Spinal tumors: {Yes/No:304960894} Cauda equina syndrome: {Yes/No:304960894} Compression fracture: {Yes/No:304960894} Abdominal aneurysm: {Yes/No:304960894}  COGNITION: Overall cognitive status:  {cognition:24006}     SENSATION: {sensation:27233}  MUSCLE LENGTH: Hamstrings: Right *** deg; Left *** deg Thomas test: Right *** deg; Left *** deg  POSTURE: {posture:25561}  PALPATION: ***  LUMBAR ROM:   AROM eval  Flexion   Extension   Right lateral flexion   Left lateral flexion   Right rotation   Left rotation    (Blank rows = not tested)  LOWER EXTREMITY ROM:     {AROM/PROM:27142}  Right eval Left eval  Hip flexion    Hip extension    Hip abduction    Hip adduction    Hip internal rotation    Hip external rotation    Knee flexion    Knee extension    Ankle dorsiflexion    Ankle plantarflexion    Ankle inversion    Ankle eversion     (Blank rows = not tested)  LOWER EXTREMITY MMT:    MMT Right eval Left eval  Hip flexion    Hip extension    Hip abduction    Hip adduction    Hip internal rotation    Hip external rotation    Knee flexion    Knee extension    Ankle dorsiflexion    Ankle plantarflexion    Ankle inversion    Ankle eversion     (Blank rows = not tested)  LUMBAR SPECIAL TESTS:  {lumbar special test:25242}  FUNCTIONAL TESTS:  {Functional tests:24029}  GAIT: Distance walked: *** Assistive device utilized: {Assistive devices:23999} Level of assistance: {Levels of assistance:24026} Comments: ***  TODAY'S TREATMENT:                                                                                                                              DATE: ***    PATIENT EDUCATION:  Education details: *** Person educated: {Person educated:25204} Education method: {Education Method:25205} Education comprehension: {Education Comprehension:25206}  HOME EXERCISE PROGRAM: ***  ASSESSMENT:  CLINICAL IMPRESSION: Patient is a *** y.o. *** who was seen today for physical therapy evaluation and treatment for ***.   OBJECTIVE IMPAIRMENTS: {opptimpairments:25111}.   ACTIVITY LIMITATIONS: {activitylimitations:27494}  PARTICIPATION  LIMITATIONS: {participationrestrictions:25113}  PERSONAL FACTORS: {Personal factors:25162} are also affecting patient's functional outcome.   REHAB POTENTIAL: {rehabpotential:25112}  CLINICAL DECISION MAKING: {clinical decision making:25114}  EVALUATION COMPLEXITY: {Evaluation complexity:25115}   GOALS: Goals reviewed with patient? {yes/no:20286}  SHORT TERM GOALS: Target date: ***  *** Baseline: Goal status: INITIAL  2.  *** Baseline:  Goal status: INITIAL  3.  *** Baseline:  Goal status: INITIAL  4.  *** Baseline:  Goal status: INITIAL  5.  *** Baseline:  Goal status: INITIAL  6.  *** Baseline:  Goal status: INITIAL  LONG TERM GOALS: Target date: ***  *** Baseline:  Goal status: INITIAL  2.  *** Baseline:  Goal status: INITIAL  3.  *** Baseline:  Goal status: INITIAL  4.  *** Baseline:  Goal status: INITIAL  5.  *** Baseline:  Goal status: INITIAL  6.  *** Baseline:  Goal status: INITIAL  PLAN:  PT FREQUENCY: {rehab frequency:25116}  PT DURATION: {rehab duration:25117}  PLANNED INTERVENTIONS: {rehab planned interventions:25118::"Therapeutic exercises","Therapeutic activity","Neuromuscular re-education","Balance training","Gait training","Patient/Family education","Self Care","Joint mobilization"}.  PLAN FOR NEXT SESSION: Mauri Reading, PT 03/23/2023, 1:48 PM

## 2023-03-26 ENCOUNTER — Ambulatory Visit: Payer: Medicaid Other | Attending: Orthopaedic Surgery

## 2023-03-26 DIAGNOSIS — M545 Low back pain, unspecified: Secondary | ICD-10-CM | POA: Insufficient documentation

## 2023-03-26 DIAGNOSIS — M6281 Muscle weakness (generalized): Secondary | ICD-10-CM | POA: Diagnosis present

## 2023-03-26 DIAGNOSIS — M5459 Other low back pain: Secondary | ICD-10-CM | POA: Diagnosis present

## 2023-03-26 DIAGNOSIS — M546 Pain in thoracic spine: Secondary | ICD-10-CM | POA: Insufficient documentation

## 2023-03-26 DIAGNOSIS — G8929 Other chronic pain: Secondary | ICD-10-CM | POA: Diagnosis present

## 2023-04-04 ENCOUNTER — Ambulatory Visit: Payer: Medicaid Other | Admitting: Physical Therapy

## 2023-04-04 ENCOUNTER — Telehealth: Payer: Self-pay | Admitting: Physical Therapy

## 2023-04-04 NOTE — Telephone Encounter (Signed)
Called and informed patient of missed visit and provided reminder of next appt and attendance policy via VM. 

## 2023-04-09 ENCOUNTER — Ambulatory Visit: Payer: Medicaid Other

## 2023-04-11 ENCOUNTER — Ambulatory Visit: Payer: Medicaid Other

## 2023-04-11 DIAGNOSIS — M546 Pain in thoracic spine: Secondary | ICD-10-CM

## 2023-04-11 DIAGNOSIS — M5459 Other low back pain: Secondary | ICD-10-CM

## 2023-04-11 DIAGNOSIS — M6281 Muscle weakness (generalized): Secondary | ICD-10-CM

## 2023-04-11 DIAGNOSIS — M545 Low back pain, unspecified: Secondary | ICD-10-CM

## 2023-04-11 NOTE — Therapy (Signed)
OUTPATIENT PHYSICAL THERAPY TREATMENT NOTE   Patient Name: Chelsea Burgess MRN: [id number] DOB:February 25, 1996, 27 y.o., female Today's Date: 04/11/2023  END OF SESSION:  PT End of Session - 04/11/23 1011     Visit Number 2    Number of Visits 17    Date for PT Re-Evaluation 05/21/23    Authorization Type [employer] MCD    Authorization Time Period 06/25/22-06/25/23    Authorization - Visit Number 2    Authorization - Number of Visits 27    Progress Note Due on Visit 17    PT Start Time 1010   Pt arrived 10 mins late   PT Stop Time 1045    PT Time Calculation (min) 35 min    Activity Tolerance Patient tolerated treatment well    Behavior During Therapy [facility] for tasks assessed/performed              Past Medical History:  Diagnosis Date   Back pain    Family history of heart attack    Herpes    Medical history non-contributory    Past Surgical History:  Procedure Laterality Date   NO PAST SURGERIES     Patient Active Problem List   Diagnosis Date Noted   BV (bacterial vaginosis) 07/13/2022   Pelvic pain 12/22/2021   Vaginal discharge 12/22/2021   Chronic bilateral low back pain without sciatica 08/22/2021   Chronic bilateral thoracic back pain 08/22/2021   Vaginal bleeding 01/02/2021   IUD check up 01/02/2021   Screen for STD (sexually transmitted disease) 01/02/2021   IUD (intrauterine device) in place 09/22/2020   Post term pregnancy over 40 weeks 09/21/2020   Uterine size-date discrepancy in third trimester 09/08/2020   History of herpes simplex infection 05/25/2020   Recurrent UTI (urinary tract infection) complicating pregnancy 03/21/2020   Chlamydia 03/21/2020   Marijuana use 03/16/2020   Rh negative status during pregnancy 03/14/2020   Encounter for supervision of normal pregnancy, antepartum 03/08/2020   Abnormal Pap smear of cervix 06/27/2018    PCP: Georganna Skeans, MD   REFERRING PROVIDER: Eldred Manges, MD   REFERRING DIAG: Chronic bilateral low  back pain without sciatica [M54.50, G89.29]   Rationale for Evaluation and Treatment: Rehabilitation  THERAPY DIAG:  Pain in thoracic spine  Other low back pain  Chronic bilateral low back pain without sciatica  Muscle weakness (generalized)  ONSET DATE: March 2022  SUBJECTIVE:  SUBJECTIVE STATEMENT: Patient reports that her pain is improving, 5/10 today, has been compliant with her HEP.    PERTINENT HISTORY:     Onset of back pain related to pregnancy/natural delivery with epidural injection in 2022.   PAIN:  Are you having pain? Yes: NPRS scale: 5/10 Pain location: mid-to-low back  Pain description: aching Aggravating factors: core exercise, bending forward  Relieving factors: not identified   PRECAUTIONS: None  RED FLAGS: None   WEIGHT BEARING RESTRICTIONS: No  FALLS:  Has patient fallen in last 6 months? 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 Ophelia Charter)   OBJECTIVE:   DIAGNOSTIC FINDINGS:   08/22/21 DG Lumbar Spine Complete   IMPRESSION: 1. Negative radiographs of the lumbar spine. 2. Incidental note of non fusion posterior elements of L5, normal variant.  PATIENT SURVEYS:  FOTO 69 current, 72 predicted   COGNITION: Overall cognitive status: Within functional limits for tasks assessed     SENSATION: Not tested   POSTURE: rounded shoulders, forward head, and slouched posture with unsupported sitting   PALPATION: Moderate to severe tenderness to palpation to L2-5 CPAs.  Mild to moderate tenderness to  palpation from T10-T2 with radiating symptoms to stomach, reported as throbbing sensation.    LUMBAR ROM:   AROM Eval   Flexion WFL  Extension WFL  Right lateral flexion WFL  Left lateral flexion WFL  Right rotation WFL  Left rotation WFL   (Blank rows = not tested)  LOWER EXTREMITY MMT:    MMT Right eval Left eval  Hip flexion 4 4  Hip extension    Hip abduction 4 4  Hip adduction 4- 4-  Hip internal rotation    Hip external rotation    Knee flexion 4+ 4+  Knee extension 4 4+  Ankle dorsiflexion 5 5  Ankle plantarflexion    Ankle inversion    Ankle eversion      LUMBAR SPECIAL TESTS:  Not assessed      TODAY'S TREATMENT:                                                                                                                               [facility] Adult PT Treatment:                                                DATE: 04/11/23 Therapeutic Exercise: Standing hip abduction/extension YTB at ankles x10 ea BIL Omega knee flexion 25# 2x10 Omega knee extension 10# 2x10 PPT 5" hold x10 Supine 90/90 iso hold 2x30" Bridges 2x10 SLR with contralateral pilates ring  push x10 BIL   [facility] Adult PT Treatment:                                                DATE: 03/26/2023   Initial evaluation: see patient education and home exercise program as noted below     PATIENT EDUCATION:  Education details: reviewed initial home exercise program; discussion of POC, prognosis and goals for skilled PT   Person educated: Patient Education method: Explanation, Demonstration, and Handouts Education comprehension: verbalized understanding, returned demonstration, and needs further education  HOME EXERCISE PROGRAM: Access Code: [facility] URL: [email or web address] Date: 03/26/2023 Prepared by: Mauri Reading  Exercises - Static Prone on Elbows  - 1 x daily - 7 x weekly - 2-3 sets - 30-60 sec hold - Prone Press Up On Elbows  - 1 x daily - 7 x weekly - 2-3 sets - 10  reps - 3 sec hold - Standing Lumbar Extension with Counter  - 1 x daily - 7 x weekly - 2-3 sets - 10 reps - 3 sec hold    ASSESSMENT:  CLINICAL IMPRESSION: Patient presents to first follow up PT session reporting continued back pain, though lessened overall, and that she has been compliant with her HEP. She states that she has a lot of pain in her back with core exercises she does at home. Worked through troubleshooting these exercises with her today to increase core engagement and decrease pain in lower back. Patient was able to tolerate all prescribed exercises with no adverse effects. Patient continues to benefit from skilled PT services and should be progressed as able to improve functional independence.    OBJECTIVE IMPAIRMENTS: decreased activity tolerance, decreased endurance, decreased strength, postural dysfunction, and pain.   ACTIVITY LIMITATIONS: carrying, lifting, bending, sleeping, and caring for others  PARTICIPATION LIMITATIONS: cleaning, interpersonal relationship, community activity, and regular physical activity/exercise  PERSONAL FACTORS: Past/current experiences and Time since onset of injury/illness/exacerbation are also affecting patient's functional outcome.   REHAB POTENTIAL: Fair    CLINICAL DECISION MAKING: Stable/uncomplicated  EVALUATION COMPLEXITY: Low   GOALS: Goals reviewed with patient? Yes  SHORT TERM GOALS: Target date: 04/23/2023   Patient will be independent with initial home program for pain centralization/modulation.  Baseline: provided at eval  Goal status: INITIAL  2.  Patient will demonstrate improved postural awareness for at least 15 minutes while seated without need for cueing from PT.   Baseline: see objective measures  Goal status: INITIAL    LONG TERM GOALS: Target date: 05/21/2023   Patient will report improved overall functional ability with FOTO score of 75  Baseline: 69 Goal status: INITIAL  2.  Patient will  demonstrate improved LE strength with MMT score of 4+/5 or greater BIL.  Baseline:  MMT Right eval Left eval  Hip flexion 4 4  Hip abduction 4 4  Hip adduction 4- 4-  Knee flexion 4+ 4+  Knee extension 4 4+  Ankle dorsiflexion 5 5   Goal status: INITIAL  3.  Patient will report no more than 3-4/10 pain at worst with normal daily activities.  Baseline: 5-6/10 Goal status: INITIAL  4.  Patient will report ability to resume regular exercises, including core strengthening exercises without significant exacerbation of symptoms.  Baseline: unable to tolerate exercises requiring core mm activation.  Goal status: INITIAL    PLAN:  PT FREQUENCY: 1-2x/week  PT DURATION: 8 weeks  PLANNED INTERVENTIONS: Therapeutic exercises, Therapeutic activity, Neuromuscular re-education, Patient/Family education, Self Care, Joint mobilization, Joint manipulation, Dry Needling, Electrical stimulation, Cryotherapy, Moist heat, Taping, Traction, Manual therapy, and Re-evaluation.  PLAN FOR NEXT SESSION: core strength assessment, continue repeated and sustained mckenzie based lumbar extension activities for pain centralization and reduction, neutral core mm activation activities.    Berta Minor PTA 04/11/2023, 10:45 AM

## 2023-04-16 ENCOUNTER — Ambulatory Visit: Payer: Medicaid Other | Admitting: Orthopaedic Surgery

## 2023-04-16 ENCOUNTER — Ambulatory Visit: Payer: Medicaid Other

## 2023-04-16 DIAGNOSIS — M6281 Muscle weakness (generalized): Secondary | ICD-10-CM

## 2023-04-16 DIAGNOSIS — M545 Low back pain, unspecified: Secondary | ICD-10-CM

## 2023-04-16 DIAGNOSIS — M5459 Other low back pain: Secondary | ICD-10-CM

## 2023-04-16 DIAGNOSIS — M546 Pain in thoracic spine: Secondary | ICD-10-CM | POA: Diagnosis not present

## 2023-04-16 NOTE — Therapy (Signed)
OUTPATIENT PHYSICAL THERAPY TREATMENT NOTE   Patient Name: Chelsea Burgess MRN: [id number] DOB:14-May-1996, 27 y.o., female Today's Date: 04/16/2023  END OF SESSION:  PT End of Session - 04/16/23 1143     Visit Number 3    Number of Visits 17    Date for PT Re-Evaluation 05/21/23    Authorization Type [employer] MCD    Authorization Time Period 06/25/22-06/25/23    Authorization - Visit Number 3    Authorization - Number of Visits 27    Progress Note Due on Visit 17    PT Start Time 1143   Pt arrvied 13 mins late   PT Stop Time 1213    PT Time Calculation (min) 30 min    Activity Tolerance Patient tolerated treatment well    Behavior During Therapy [facility] for tasks assessed/performed             Past Medical History:  Diagnosis Date   Back pain    Family history of heart attack    Herpes    Medical history non-contributory    Past Surgical History:  Procedure Laterality Date   NO PAST SURGERIES     Patient Active Problem List   Diagnosis Date Noted   BV (bacterial vaginosis) 07/13/2022   Pelvic pain 12/22/2021   Vaginal discharge 12/22/2021   Chronic bilateral low back pain without sciatica 08/22/2021   Chronic bilateral thoracic back pain 08/22/2021   Vaginal bleeding 01/02/2021   IUD check up 01/02/2021   Screen for STD (sexually transmitted disease) 01/02/2021   IUD (intrauterine device) in place 09/22/2020   Post term pregnancy over 40 weeks 09/21/2020   Uterine size-date discrepancy in third trimester 09/08/2020   History of herpes simplex infection 05/25/2020   Recurrent UTI (urinary tract infection) complicating pregnancy 03/21/2020   Chlamydia 03/21/2020   Marijuana use 03/16/2020   Rh negative status during pregnancy 03/14/2020   Encounter for supervision of normal pregnancy, antepartum 03/08/2020   Abnormal Pap smear of cervix 06/27/2018    PCP: Georganna Skeans, MD   REFERRING PROVIDER: Eldred Manges, MD   REFERRING DIAG: Chronic bilateral low  back pain without sciatica [M54.50, G89.29]   Rationale for Evaluation and Treatment: Rehabilitation  THERAPY DIAG:  Pain in thoracic spine  Other low back pain  Chronic bilateral low back pain without sciatica  Muscle weakness (generalized)  ONSET DATE: March 2022  SUBJECTIVE:                                                                                                                                                                                           SUBJECTIVE  STATEMENT: Patient reports 3/10 lower back pain today.     PERTINENT HISTORY:     Onset of back pain related to pregnancy/natural delivery with epidural injection in 2022.   PAIN:  Are you having pain? Yes: NPRS scale: 3/10 Pain location: mid-to-low back  Pain description: aching Aggravating factors: core exercise, bending forward  Relieving factors: not identified   PRECAUTIONS: None  RED FLAGS: None   WEIGHT BEARING RESTRICTIONS: No  FALLS:  Has patient fallen in last 6 months? 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 Ophelia Charter)   OBJECTIVE:   DIAGNOSTIC FINDINGS:   08/22/21 DG Lumbar Spine Complete   IMPRESSION: 1. Negative radiographs of the lumbar spine. 2. Incidental note of non fusion posterior elements of L5, normal variant.  PATIENT SURVEYS:  FOTO 69 current, 72 predicted   COGNITION: Overall cognitive status: Within functional limits for tasks assessed     SENSATION: Not tested   POSTURE: rounded shoulders, forward head, and slouched posture with unsupported sitting   PALPATION: Moderate to severe tenderness to palpation to L2-5 CPAs.  Mild to moderate tenderness to palpation from T10-T2 with radiating  symptoms to stomach, reported as throbbing sensation.    LUMBAR ROM:   AROM Eval   Flexion WFL  Extension WFL  Right lateral flexion WFL  Left lateral flexion WFL  Right rotation WFL  Left rotation WFL   (Blank rows = not tested)  LOWER EXTREMITY MMT:    MMT Right eval Left eval  Hip flexion 4 4  Hip extension    Hip abduction 4 4  Hip adduction 4- 4-  Hip internal rotation    Hip external rotation    Knee flexion 4+ 4+  Knee extension 4 4+  Ankle dorsiflexion 5 5  Ankle plantarflexion    Ankle inversion    Ankle eversion      LUMBAR SPECIAL TESTS:  Not assessed      TODAY'S TREATMENT:    [facility] Adult PT Treatment:                                                DATE: 04/16/23 Therapeutic Exercise: PPT 5" hold x10 Supine 90/90 iso hold 2x30" Bridges 2x10 Supine hip adduction pilates ring 5" hold 2x10 SLR with contralateral pilates ring push x10 BIL Prone alternating UE/LE lift 2x10 BIL Planks x30" Childs pose x30" Prone press ups x10 QL stretch x30"                                                                                [facility] Adult PT Treatment:  DATE: 04/11/23 Therapeutic Exercise: Standing hip abduction/extension YTB at ankles x10 ea BIL Omega knee flexion 25# 2x10 Omega knee extension 10# 2x10 PPT 5" hold x10 Supine 90/90 iso hold 2x30" Bridges 2x10 SLR with contralateral pilates ring push x10 BIL   [facility] Adult PT Treatment:                                                DATE: 03/26/2023   Initial evaluation: see patient education and home exercise program as noted below     PATIENT EDUCATION:  Education details: reviewed initial home exercise program; discussion of POC, prognosis and goals for skilled PT   Person educated: Patient Education method: Explanation, Demonstration, and Handouts Education comprehension: verbalized understanding, returned demonstration, and needs further  education  HOME EXERCISE PROGRAM: Access Code: [facility] URL: [email or web address] Date: 03/26/2023 Prepared by: Mauri Reading  Exercises - Static Prone on Elbows  - 1 x daily - 7 x weekly - 2-3 sets - 30-60 sec hold - Prone Press Up On Elbows  - 1 x daily - 7 x weekly - 2-3 sets - 10 reps - 3 sec hold - Standing Lumbar Extension with Counter  - 1 x daily - 7 x weekly - 2-3 sets - 10 reps - 3 sec hold    ASSESSMENT:  CLINICAL IMPRESSION: Patient presents to PT 13 minutes late, truncating session, reporting continued lower back pain and that she has been compliant with her HEP. Session today focused on proximal hip and core strengthening. Patient was able to tolerate all prescribed exercises with no adverse effects. Patient continues to benefit from skilled PT services and should be progressed as able to improve functional independence.     OBJECTIVE IMPAIRMENTS: decreased activity tolerance, decreased endurance, decreased strength, postural dysfunction, and pain.   ACTIVITY LIMITATIONS: carrying, lifting, bending, sleeping, and caring for others  PARTICIPATION LIMITATIONS: cleaning, interpersonal relationship, community activity, and regular physical activity/exercise  PERSONAL FACTORS: Past/current experiences and Time since onset of injury/illness/exacerbation are also affecting patient's functional outcome.   REHAB POTENTIAL: Fair    CLINICAL DECISION MAKING: Stable/uncomplicated  EVALUATION COMPLEXITY: Low   GOALS: Goals reviewed with patient? Yes  SHORT TERM GOALS: Target date: 04/23/2023   Patient will be independent with initial home program for pain centralization/modulation.  Baseline: provided at eval  Goal status: INITIAL  2.  Patient will demonstrate improved postural awareness for at least 15 minutes while seated without need for cueing from PT.   Baseline: see objective measures  Goal status: INITIAL    LONG TERM GOALS: Target date:  05/21/2023   Patient will report improved overall functional ability with FOTO score of 75  Baseline: 69 Goal status: INITIAL  2.  Patient will demonstrate improved LE strength with MMT score of 4+/5 or greater BIL.  Baseline:  MMT Right eval Left eval  Hip flexion 4 4  Hip abduction 4 4  Hip adduction 4- 4-  Knee flexion 4+ 4+  Knee extension 4 4+  Ankle dorsiflexion 5 5   Goal status: INITIAL  3.  Patient will report no more than 3-4/10 pain at worst with normal daily activities.  Baseline: 5-6/10 Goal status: INITIAL  4.  Patient will report ability to resume regular exercises, including core strengthening exercises without significant exacerbation of symptoms.  Baseline: unable to tolerate exercises requiring core mm activation.  Goal status: INITIAL    PLAN:  PT FREQUENCY: 1-2x/week  PT DURATION: 8 weeks  PLANNED INTERVENTIONS: Therapeutic exercises, Therapeutic activity, Neuromuscular re-education, Patient/Family education, Self Care, Joint mobilization, Joint manipulation, Dry Needling, Electrical stimulation, Cryotherapy, Moist heat, Taping, Traction, Manual therapy, and Re-evaluation.  PLAN FOR NEXT SESSION: core strength assessment, continue repeated and sustained mckenzie based lumbar extension activities for pain centralization and reduction, neutral core mm activation activities.    Berta Minor PTA 04/16/2023, 12:15 PM

## 2023-04-18 ENCOUNTER — Ambulatory Visit: Payer: Medicaid Other

## 2023-04-23 ENCOUNTER — Ambulatory Visit: Payer: Medicaid Other

## 2023-04-25 ENCOUNTER — Ambulatory Visit: Payer: Medicaid Other

## 2023-04-25 DIAGNOSIS — G8929 Other chronic pain: Secondary | ICD-10-CM

## 2023-04-25 DIAGNOSIS — M6281 Muscle weakness (generalized): Secondary | ICD-10-CM

## 2023-04-25 DIAGNOSIS — M546 Pain in thoracic spine: Secondary | ICD-10-CM

## 2023-04-25 DIAGNOSIS — M5459 Other low back pain: Secondary | ICD-10-CM

## 2023-04-25 NOTE — Therapy (Addendum)
 OUTPATIENT PHYSICAL THERAPY TREATMENT NOTE   Patient Name: Chelsea Burgess MRN: [id number] DOB:[birth year], 27 y.o., female Today's Date: 04/25/2023   PHYSICAL THERAPY DISCHARGE SUMMARY  Visits from Start of Care: 4  Current functional level related to goals / functional outcomes: See most recent assessment.   Remaining deficits: See most recent assessment.   Education / Equipment: See most recent assessment.   Patient agrees to discharge. Patient goals were  Unknown, See most recent assessment. . Patient is being discharged due to not returning since the last visit.  Albesa Huguenin, PT, DPT 10/24/2023, 12:54 PM    END OF SESSION:  PT End of Session - 04/25/23 1006     Visit Number 4    Number of Visits 17    Date for PT Re-Evaluation 05/21/23    Authorization Type [employer] MCD    Authorization Time Period 06/25/22-06/25/23    Authorization - Visit Number 4    Authorization - Number of Visits 27    Progress Note Due on Visit 17    PT Start Time 1005    PT Stop Time 1043    PT Time Calculation (min) 38 min    Activity Tolerance Patient tolerated treatment well    Behavior During Therapy WFL for tasks assessed/performed              Past Medical History:  Diagnosis Date   Back pain    Family history of heart attack    Herpes    Medical history non-contributory    Past Surgical History:  Procedure Laterality Date   NO PAST SURGERIES     Patient Active Problem List   Diagnosis Date Noted   BV (bacterial vaginosis) 07/13/2022   Pelvic pain 12/22/2021   Vaginal discharge 12/22/2021   Chronic bilateral low back pain without sciatica 08/22/2021   Chronic bilateral thoracic back pain 08/22/2021   Vaginal bleeding 01/02/2021   IUD check up 01/02/2021   Screen for STD (sexually transmitted disease) 01/02/2021   IUD (intrauterine device) in place 09/22/2020   Post term pregnancy over 40 weeks 09/21/2020   Uterine size-date discrepancy in third trimester  09/08/2020   History of herpes simplex infection 05/25/2020   Recurrent UTI (urinary tract infection) complicating pregnancy 03/21/2020   Chlamydia 03/21/2020   Marijuana use 03/16/2020   Rh negative status during pregnancy 03/14/2020   Encounter for supervision of normal pregnancy, antepartum 03/08/2020   Abnormal Pap smear of cervix 06/27/2018    PCP: Abraham Abo, MD   REFERRING PROVIDER: Adah Acron, MD   REFERRING DIAG: Chronic bilateral low back pain without sciatica [M54.50, G89.29]   Rationale for Evaluation and Treatment: Rehabilitation  THERAPY DIAG:  Pain in thoracic spine  Other low back pain  Chronic bilateral low back pain without sciatica  Muscle weakness (generalized)  ONSET DATE: March 2022  SUBJECTIVE:  SUBJECTIVE STATEMENT: Patient reports that her pain has increased since yesterday, does not report anything in particular that caused the increase.    PERTINENT HISTORY:     Onset of back pain related to pregnancy/natural delivery with epidural injection in 2022.   PAIN:  Are you having pain? Yes: NPRS scale: 7/10 Pain location: mid-to-low back  Pain description: aching Aggravating factors: core exercise, bending forward  Relieving factors: not identified   PRECAUTIONS: None  RED FLAGS: None   WEIGHT BEARING RESTRICTIONS: No  FALLS:  Has patient fallen in last 6 months? 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 Murrel Arnt)   OBJECTIVE:   DIAGNOSTIC FINDINGS:   08/22/21 DG Lumbar Spine  Complete   IMPRESSION: 1. Negative radiographs of the lumbar spine. 2. Incidental note of non fusion posterior elements of L5, normal variant.  PATIENT SURVEYS:  FOTO 69 current, 72 predicted   COGNITION: Overall cognitive status: Within functional limits for tasks assessed     SENSATION: Not tested   POSTURE: rounded shoulders, forward head, and slouched posture with unsupported sitting   PALPATION: Moderate to severe tenderness to palpation to L2-5 CPAs.  Mild to moderate tenderness to palpation from T10-T2 with radiating symptoms to stomach, reported as throbbing sensation.    LUMBAR ROM:   AROM Eval   Flexion WFL  Extension WFL  Right lateral flexion WFL  Left lateral flexion WFL  Right rotation WFL  Left rotation WFL   (Blank rows = not tested)  LOWER EXTREMITY MMT:    MMT Right eval Left eval  Hip flexion 4 4  Hip extension    Hip abduction 4 4  Hip adduction 4- 4-  Hip internal rotation    Hip external rotation    Knee flexion 4+ 4+  Knee extension 4 4+  Ankle dorsiflexion 5 5  Ankle plantarflexion    Ankle inversion    Ankle eversion      LUMBAR SPECIAL TESTS:  Not assessed      TODAY'S TREATMENT:    [facility] Adult PT Treatment:                                                DATE: 04/25/23 Therapeutic Exercise: Nustep level 5 x 6 mins while gathering subjective PPT 5" hold x10 Supine 90/90 iso hold 2x30" Bridges with ball 2x10 Prone press ups x10 Thoracic ext over foam roller x10 Seated pball roll outs fwd/lat x10 ea Sidelying open books x10 BIL Manual: Prone thoracic manipulation performed by certified therapist Loral Roch, DPT.   [facility] Adult PT Treatment:                                                DATE: 04/16/23 Therapeutic Exercise: PPT 5" hold x10 Supine 90/90 iso hold 2x30" Bridges 2x10 Supine hip adduction pilates ring 5" hold 2x10 SLR with contralateral pilates ring push x10 BIL Prone alternating UE/LE lift 2x10  BIL Planks x30" Childs pose x30" Prone press ups  x10 QL stretch x30"                                                                                [facility] Adult PT Treatment:                                                DATE: 04/11/23 Therapeutic Exercise: Standing hip abduction/extension YTB at ankles x10 ea BIL Omega knee flexion 25# 2x10 Omega knee extension 10# 2x10 PPT 5" hold x10 Supine 90/90 iso hold 2x30" Bridges 2x10 SLR with contralateral pilates ring push x10 BIL    PATIENT EDUCATION:  Education details: reviewed initial home exercise program; discussion of POC, prognosis and goals for skilled PT   Person educated: Patient Education method: Explanation, Demonstration, and Handouts Education comprehension: verbalized understanding, returned demonstration, and needs further education  HOME EXERCISE PROGRAM: Access Code: [facility] URL: [email or web address] Date: 03/26/2023 Prepared by: Arlester Bence  Exercises - Static Prone on Elbows  - 1 x daily - 7 x weekly - 2-3 sets - 30-60 sec hold - Prone Press Up On Elbows  - 1 x daily - 7 x weekly - 2-3 sets - 10 reps - 3 sec hold - Standing Lumbar Extension with Counter  - 1 x daily - 7 x weekly - 2-3 sets - 10 reps - 3 sec hold    ASSESSMENT:  CLINICAL IMPRESSION: Patient presents to PT reporting increased pain in her thoracic spine today, stating the pain increased yesterday evening for no particular reason. Session today focused on gentle core and proximal hip strengthening as well as stretching to decrease pain and tightness. Prone thoracic manipulation performed by certified therapist Loral Roch, DPT, with patient reporting decreased tension afterwards. Patient was able to tolerate all prescribed exercises with no adverse effects. Patient continues to benefit from skilled PT services and should be progressed as able to improve functional independence.     OBJECTIVE IMPAIRMENTS: decreased activity  tolerance, decreased endurance, decreased strength, postural dysfunction, and pain.   ACTIVITY LIMITATIONS: carrying, lifting, bending, sleeping, and caring for others  PARTICIPATION LIMITATIONS: cleaning, interpersonal relationship, community activity, and regular physical activity/exercise  PERSONAL FACTORS: Past/current experiences and Time since onset of injury/illness/exacerbation are also affecting patient's functional outcome.   REHAB POTENTIAL: Fair    CLINICAL DECISION MAKING: Stable/uncomplicated  EVALUATION COMPLEXITY: Low   GOALS: Goals reviewed with patient? Yes  SHORT TERM GOALS: Target date: 04/23/2023   Patient will be independent with initial home program for pain centralization/modulation.  Baseline: provided at eval  Goal status: INITIAL  2.  Patient will demonstrate improved postural awareness for at least 15 minutes while seated without need for cueing from PT.   Baseline: see objective measures  Goal status: INITIAL    LONG TERM GOALS: Target date: 05/21/2023   Patient will report improved overall functional ability with FOTO score of 75  Baseline: 69 Goal status: INITIAL  2.  Patient will demonstrate improved LE strength with MMT score of 4+/5 or greater BIL.  Baseline:  MMT Right eval Left eval  Hip flexion 4 4  Hip abduction 4 4  Hip adduction 4- 4-  Knee flexion 4+ 4+  Knee extension 4 4+  Ankle dorsiflexion 5 5   Goal status: INITIAL  3.  Patient will report no more than 3-4/10 pain at worst with normal daily activities.  Baseline: 5-6/10 Goal status: INITIAL  4.  Patient will report ability to resume regular exercises, including core strengthening exercises without significant exacerbation of symptoms.  Baseline: unable to tolerate exercises requiring core mm activation.  Goal status: INITIAL    PLAN:  PT FREQUENCY: 1-2x/week  PT DURATION: 8 weeks  PLANNED INTERVENTIONS: Therapeutic exercises, Therapeutic activity,  Neuromuscular re-education, Patient/Family education, Self Care, Joint mobilization, Joint manipulation, Dry Needling, Electrical stimulation, Cryotherapy, Moist heat, Taping, Traction, Manual therapy, and Re-evaluation.  PLAN FOR NEXT SESSION: core strength assessment, continue repeated and sustained mckenzie based lumbar extension activities for pain centralization and reduction, neutral core mm activation activities.    Anna Kettering PTA 04/25/2023, 10:42 AM

## 2023-04-30 ENCOUNTER — Ambulatory Visit: Payer: Medicaid Other

## 2023-05-02 ENCOUNTER — Ambulatory Visit: Payer: Medicaid Other

## 2023-07-08 ENCOUNTER — Ambulatory Visit: Payer: Self-pay

## 2023-07-08 NOTE — Telephone Encounter (Signed)
 Patient called and she says that her OBGYN was able get her in for an appointment tomorrow, so she doesn't need to be seen at [facility].   Summary: STD testing request   Pt wants to be seen as soon as possible, please advise. No appts. Pt wants STD screening. Best contact: [phone]

## 2023-07-09 ENCOUNTER — Other Ambulatory Visit (INDEPENDENT_AMBULATORY_CARE_PROVIDER_SITE_OTHER): Payer: Medicaid Other | Admitting: *Deleted

## 2023-07-09 ENCOUNTER — Other Ambulatory Visit (HOSPITAL_COMMUNITY)
Admission: RE | Admit: 2023-07-09 | Discharge: 2023-07-09 | Disposition: A | Discharge status: Home / Self Care | Payer: Medicaid Other | Source: Ambulatory Visit | Attending: Obstetrics & Gynecology | Admitting: Obstetrics & Gynecology

## 2023-07-09 DIAGNOSIS — N898 Other specified noninflammatory disorders of vagina: Secondary | ICD-10-CM

## 2023-07-09 NOTE — Progress Notes (Signed)
   NURSE VISIT- VAGINITIS  SUBJECTIVE:  Chelsea Burgess is a 28 y.o. [id number] GYN patientfemale here for a vaginal swab for vaginitis screening.  She reports the following symptoms: discharge described as milky and local irritation for 5 days. Denies abnormal vaginal bleeding, significant pelvic pain, fever, or UTI symptoms.  OBJECTIVE:  There were no vitals taken for this visit.  Appears well, in no apparent distress  ASSESSMENT: Vaginal swab for vaginitis screening  PLAN: Self-collected vaginal probe for Gonorrhea, Chlamydia, Trichomonas, Bacterial Vaginosis, Yeast sent to lab Treatment: to be determined once results are received Follow-up as needed if symptoms persist/worsen, or new symptoms develop Patient requests gel if needed for BV and tablets instead of capsules as she has difficulty swallowing pills  Javell Blackburn  07/09/2023 11:28 AM

## 2023-07-10 LAB — CERVICOVAGINAL ANCILLARY ONLY
Bacterial Vaginitis (gardnerella): POSITIVE — AB
Candida Glabrata: NEGATIVE
Candida Vaginitis: POSITIVE — AB
Chlamydia: NEGATIVE
Comment: NEGATIVE
Comment: NEGATIVE
Comment: NEGATIVE
Comment: NEGATIVE
Comment: NEGATIVE
Comment: NORMAL
Neisseria Gonorrhea: NEGATIVE
Trichomonas: NEGATIVE

## 2023-07-11 ENCOUNTER — Other Ambulatory Visit: Payer: Self-pay | Admitting: Adult Health

## 2023-07-11 MED ORDER — METRONIDAZOLE 0.75 % VA GEL: VAGINAL | 1 refills | Status: DC

## 2023-07-11 MED ORDER — FLUCONAZOLE 150 MG PO TABS: ORAL_TABLET | ORAL | 1 refills | Status: DC

## 2023-07-11 NOTE — Progress Notes (Signed)
+  BV and yeast on vaginal swab, will rx metrogel and diflucan, no sex while taking

## 2023-08-24 IMAGING — DX DG LUMBAR SPINE COMPLETE 4+V
4 series · 4 of 4 positions shown · non-contrast
Comparison: None.

CLINICAL DATA: Chronic bilateral low back pain without sciatica.
Low back pain.

EXAM:
LUMBAR SPINE - COMPLETE 4+ VIEW

[lumbar spine ap]
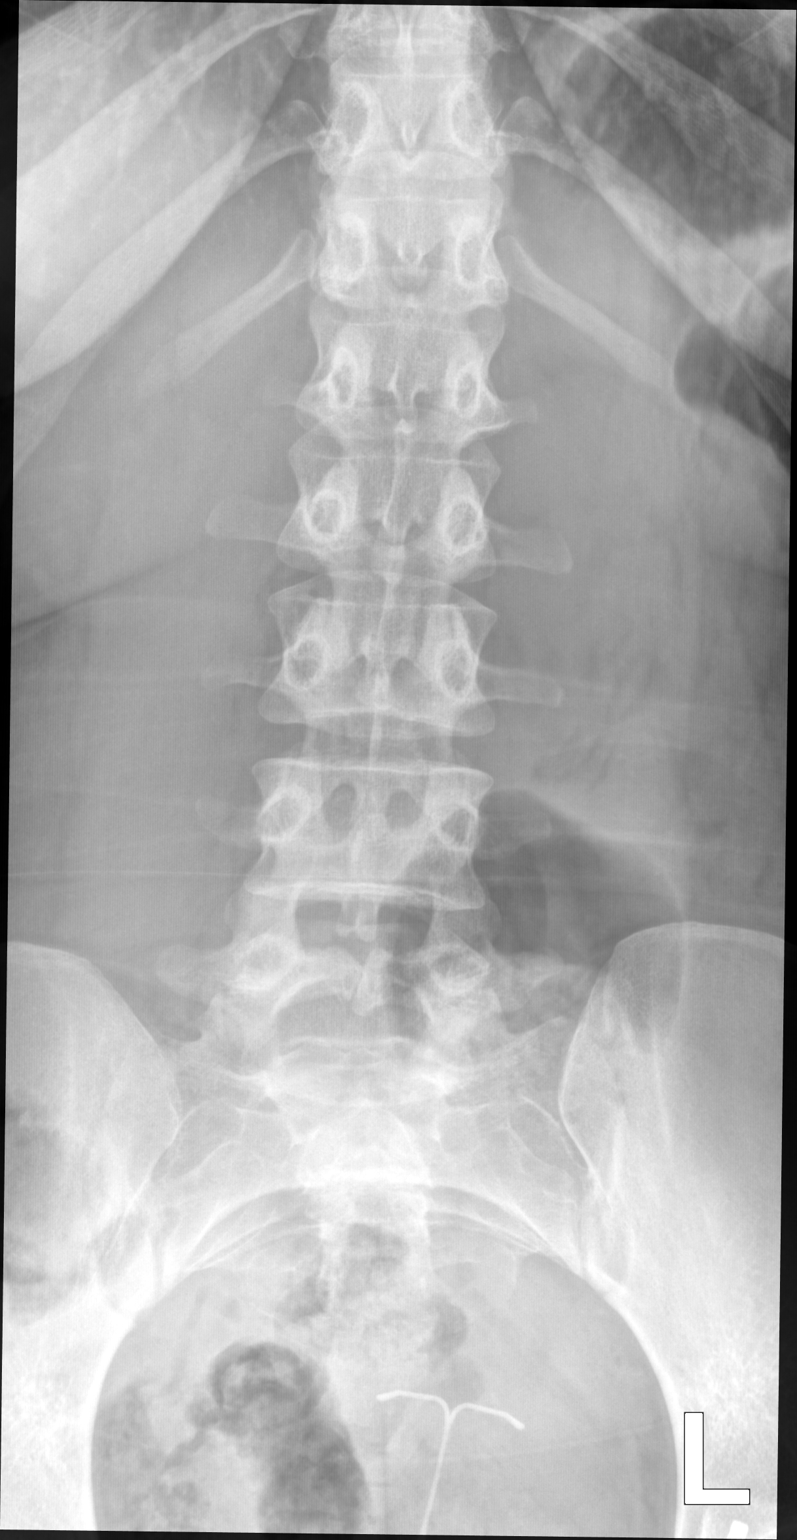

[lumbar spine oblique standing (1 of 2)]
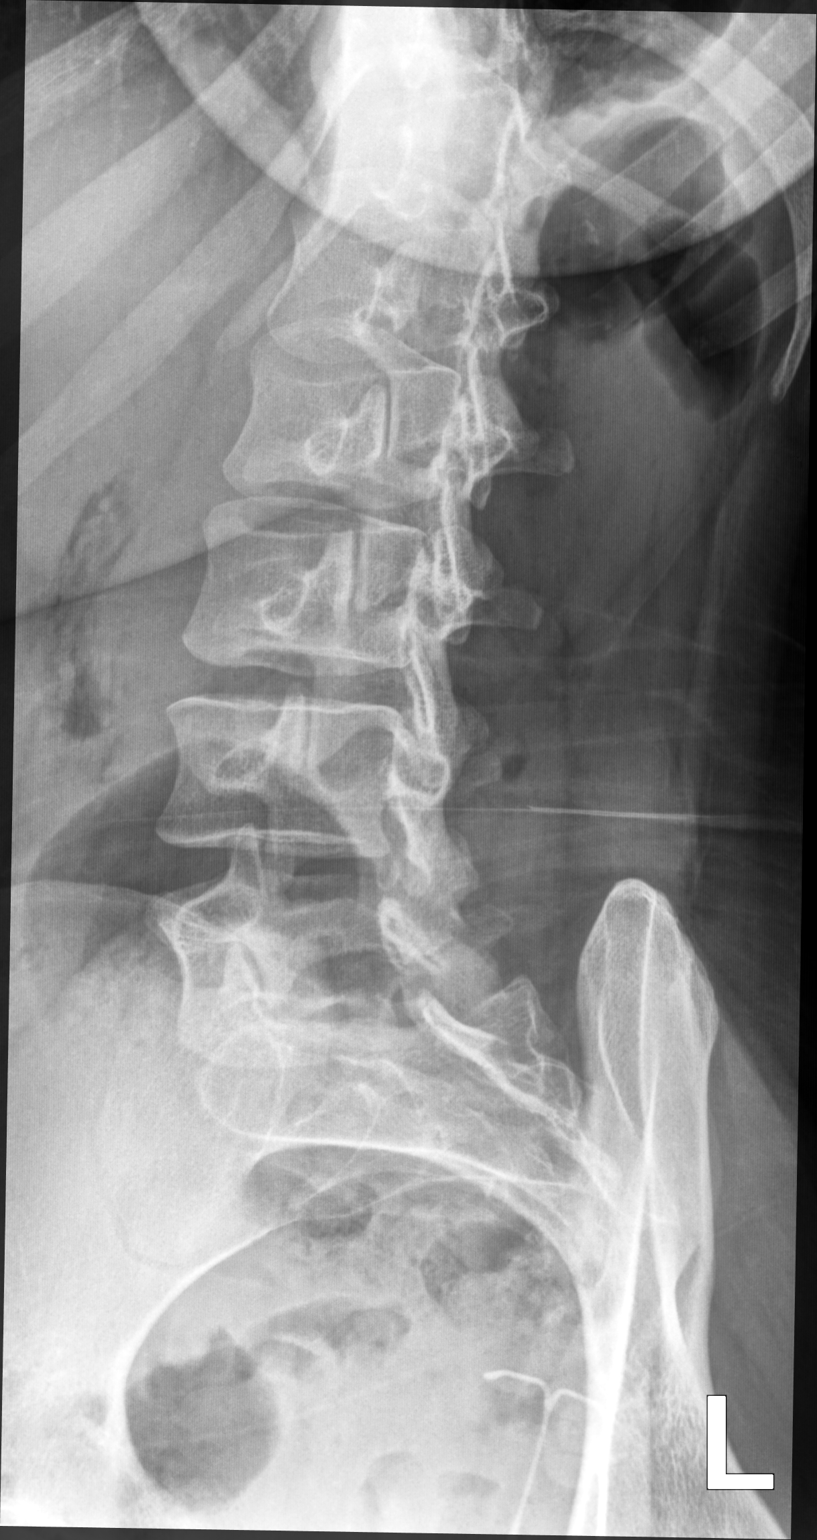

[lumbar spine oblique standing (2 of 2)]
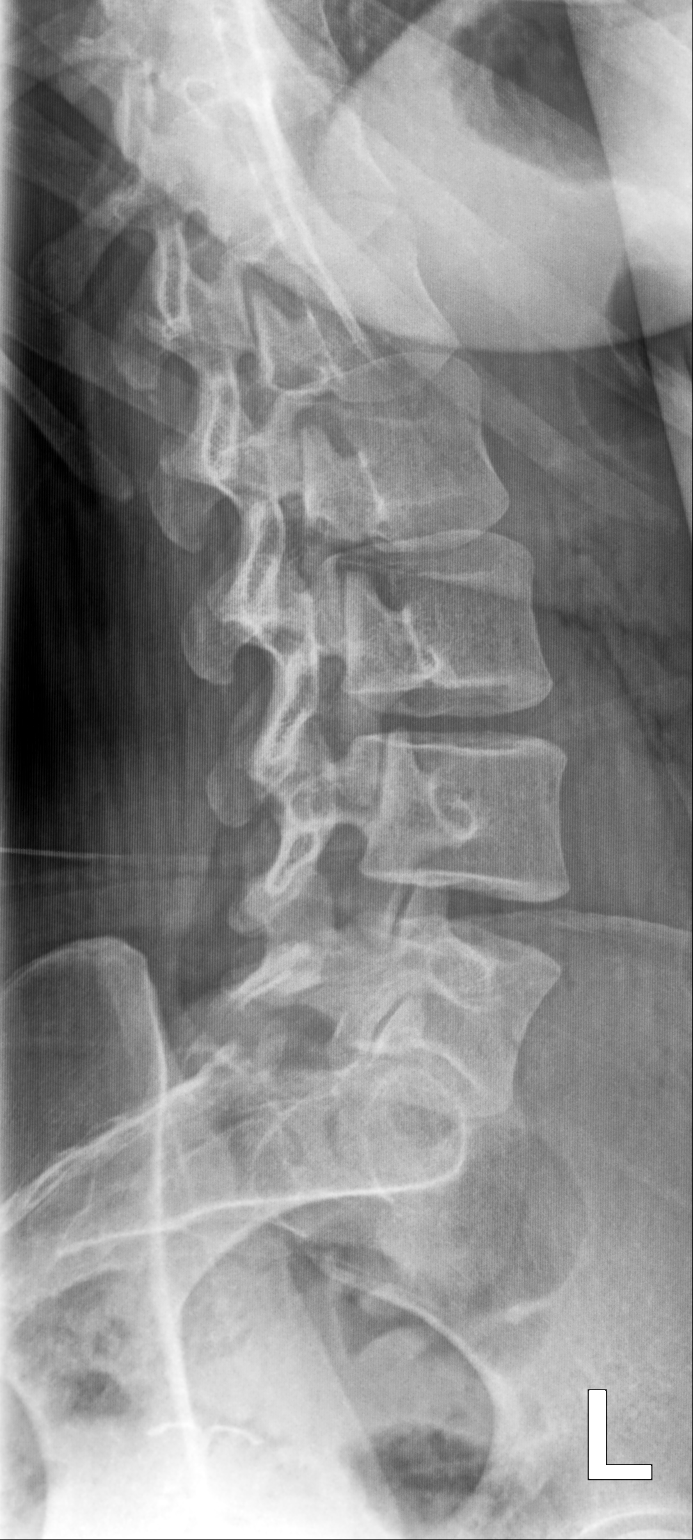

[lumbar spine lateral position lat]
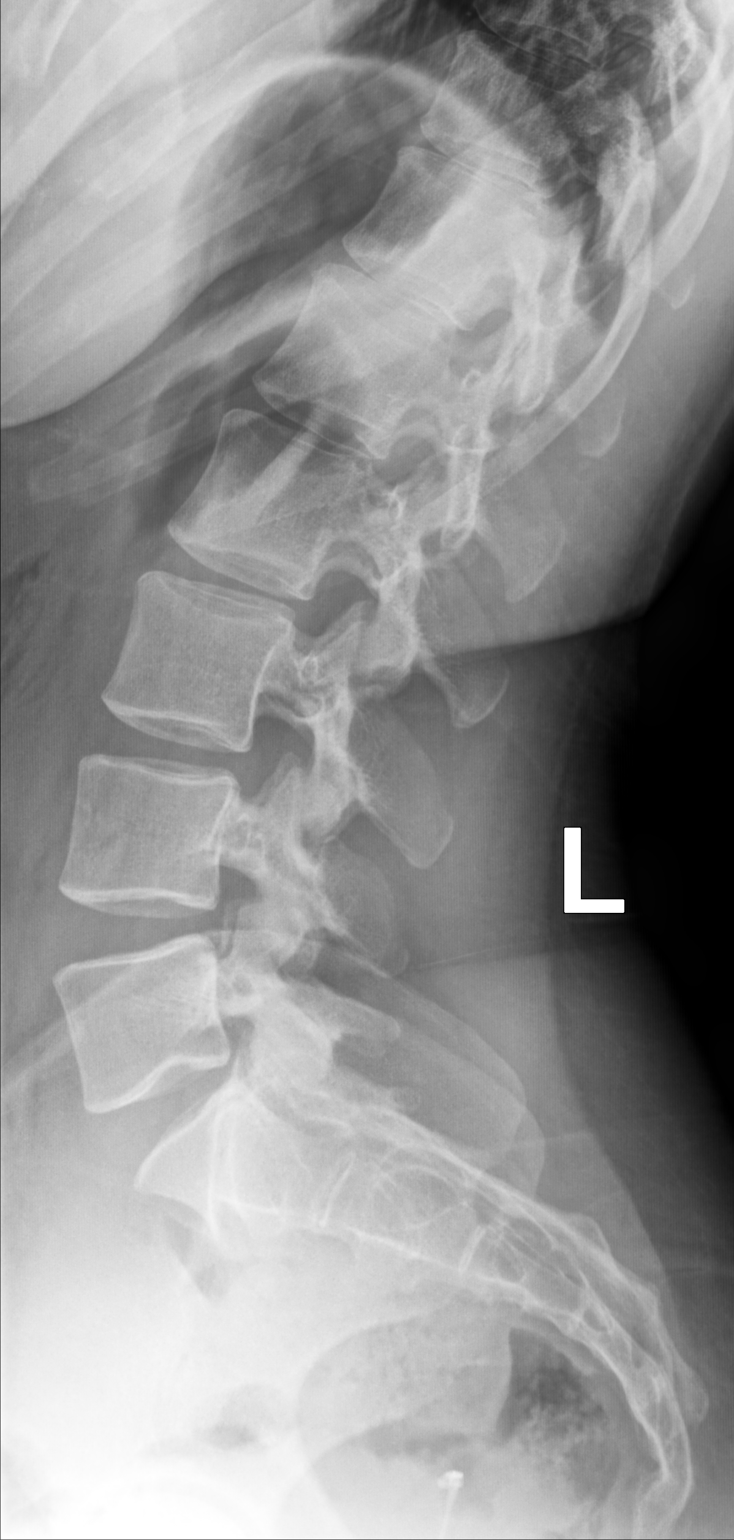

[4 of 4 positions shown; findings below may reference images not displayed]

FINDINGS: There are 5 non-rib-bearing lumbar vertebra. The alignment is
maintained. Vertebral body heights are normal. There is no
listhesis. The posterior elements are intact. Disc spaces are
preserved. No fracture, focal lesion or pars defect. Incidental note
of non fusion posterior elements of L5. Sacroiliac joints are
symmetric and normal. IUD in the pelvis.
IMPRESSION: 1. Negative radiographs of the lumbar spine.
2. Incidental note of non fusion posterior elements of L5, normal
variant.

## 2023-08-27 ENCOUNTER — Ambulatory Visit: Admission: EM | Admit: 2023-08-27 | Discharge: 2023-08-27 | Discharge status: Against Medical Advice

## 2023-08-28 ENCOUNTER — Ambulatory Visit
Admission: EM | Admit: 2023-08-28 | Discharge: 2023-08-28 | Disposition: A | Discharge status: Home / Self Care | Attending: Family Medicine | Admitting: Family Medicine

## 2023-08-28 ENCOUNTER — Other Ambulatory Visit: Payer: Self-pay

## 2023-08-28 ENCOUNTER — Encounter: Payer: Self-pay | Admitting: *Deleted

## 2023-08-28 ENCOUNTER — Ambulatory Visit: Admit: 2023-08-28 | Discharge: 2023-08-28 | Discharge status: Home / Self Care | Payer: Self-pay

## 2023-08-28 DIAGNOSIS — N898 Other specified noninflammatory disorders of vagina: Secondary | ICD-10-CM

## 2023-08-28 DIAGNOSIS — R350 Frequency of micturition: Secondary | ICD-10-CM

## 2023-08-28 LAB — POCT URINALYSIS DIP (MANUAL ENTRY)
Bilirubin, UA: NEGATIVE
Blood, UA: NEGATIVE
Glucose, UA: NEGATIVE mg/dL
Ketones, POC UA: NEGATIVE mg/dL
Leukocytes, UA: NEGATIVE
Nitrite, UA: NEGATIVE
Spec Grav, UA: 1.02 (ref 1.010–1.025)
Urobilinogen, UA: 0.2 U/dL
pH, UA: 8 (ref 5.0–8.0)

## 2023-08-28 NOTE — ED Triage Notes (Signed)
 Pt reports vaginal irritation "for a while". States she had medication (gel) for BV which she tried last Thursday but Sx did not resolve. Also reports urinary frequency x 1 week. Requesting swab for STI

## 2023-08-28 NOTE — Discharge Instructions (Signed)
We have sent testing for various causes of vaginal infections. We will notify you of any positive results once they are received. If required, we will prescribe any medications you might need.  Please refrain from all sexual activity for at least the next seven days.  

## 2023-08-28 NOTE — ED Provider Notes (Signed)
 [facility]   [id number] 08/28/23 Arrival Time: 1204  ASSESSMENT & PLAN:  1. Vaginal irritation   2. Urinary frequency    U/A without signs of infection.   Discharge Instructions      We have sent testing for various causes of vaginal infections. We will notify you of any positive results once they are received. If required, we will prescribe any medications you might need.  Please refrain from all sexual activity for at least the next seven days.     Without s/s of PID. Declines empiric treatment.  Labs Reviewed  POCT URINALYSIS DIP (MANUAL ENTRY) - Abnormal; Notable for the following components:      Result Value   Color, UA light yellow (*)    Clarity, UA cloudy (*)    Protein Ur, POC trace (*)    All other components within normal limits  CERVICOVAGINAL ANCILLARY ONLY   Reviewed expectations re: course of current medical issues. Questions answered. Outlined signs and symptoms indicating need for more acute intervention. Patient verbalized understanding. After Visit Summary given.   SUBJECTIVE:  Chelsea Burgess is a 28 y.o. female who presents with complaint of vaginal irritation/discharge. Finished Rx Flagyl last week for BV; no help. Does have new sexual partner; desires STI testing. Otherwise well. Denies fever/abd pain.  No LMP recorded. (Menstrual status: IUD).   OBJECTIVE:  Vitals:   08/28/23 1225  BP: 117/81  Pulse: 65  Resp: 16  Temp: 97.9 F (36.6 C)  TempSrc: Oral  SpO2: 99%     General appearance: alert, cooperative, appears stated age and no distress GU: deferred Skin: warm and dry Psychological: alert and cooperative; normal mood and affect.  Results for orders placed or performed during the hospital encounter of 08/28/23  POCT urinalysis dipstick   Collection Time: 08/28/23 12:35 PM  Result Value Ref Range   Color, UA light yellow (A) yellow   Clarity, UA cloudy (A) clear   Glucose, UA negative negative mg/dL    Bilirubin, UA negative negative   Ketones, POC UA negative negative mg/dL   Spec Grav, UA 4.098 1.191 - 1.025   Blood, UA negative negative   pH, UA 8.0 5.0 - 8.0   Protein Ur, POC trace (A) negative mg/dL   Urobilinogen, UA 0.2 0.2 or 1.0 E.U./dL   Nitrite, UA Negative Negative   Leukocytes, UA Negative Negative    Labs Reviewed  POCT URINALYSIS DIP (MANUAL ENTRY) - Abnormal; Notable for the following components:      Result Value   Color, UA light yellow (*)    Clarity, UA cloudy (*)    Protein Ur, POC trace (*)    All other components within normal limits  CERVICOVAGINAL ANCILLARY ONLY    No Known Allergies  Past Medical History:  Diagnosis Date   Back pain    Family history of heart attack    Herpes    Medical history non-contributory    Family History  Problem Relation Age of Onset   Diabetes Maternal Grandmother    Cancer Maternal Grandfather        colon   Heart attack Father    Stroke Mother    Heart attack Mother    Stroke Maternal Aunt    Social History   Socioeconomic History   Marital status: Significant Other    Spouse name: Not on file   Number of children: 1   Years of education: Not on file   Highest education level: Not on  file  Occupational History   Not on file  Tobacco Use   Smoking status: Never   Smokeless tobacco: Never  Vaping Use   Vaping status: Never Used  Substance and Sexual Activity   Alcohol use: Not Currently    Comment: rare   Drug use: Never   Sexual activity: Not Currently    Birth control/protection: I.U.D.  Other Topics Concern   Not on file  Social History Narrative   ** Merged History Encounter **       ** Merged History Encounter **       Social Drivers of Corporate investment banker Strain: Low Risk  (07/04/2020)   Overall Financial Resource Strain (CARDIA)    Difficulty of Paying Living Expenses: Not very hard  Food Insecurity: No Food Insecurity (07/04/2020)   Hunger Vital Sign    Worried About  Running Out of Food in the Last Year: Never true    Ran Out of Food in the Last Year: Never true  Transportation Needs: No Transportation Needs (07/04/2020)   PRAPARE - Administrator, Civil Service (Medical): No    Lack of Transportation (Non-Medical): No  Physical Activity: Insufficiently Active (07/04/2020)   Exercise Vital Sign    Days of Exercise per Week: 1 day    Minutes of Exercise per Session: 10 min  Stress: No Stress Concern Present (07/04/2020)   [employer] of Occupational Health - Occupational Stress Questionnaire    Feeling of Stress : Not at all  Social Connections: Moderately Integrated (07/04/2020)   Social Connection and Isolation Panel [NHANES]    Frequency of Communication with Friends and Family: More than three times a week    Frequency of Social Gatherings with Friends and Family: Twice a week    Attends Religious Services: More than 4 times per year    Active Member of [employer] or Organizations: No    Attends Banker Meetings: Never    Marital Status: Living with partner  Intimate Partner Violence: Not At Risk (07/04/2020)   Humiliation, Afraid, Rape, and Kick questionnaire    Fear of Current or Ex-Partner: No    Emotionally Abused: No    Physically Abused: No    Sexually Abused: No           Mardella Layman, MD 08/28/23 1304

## 2023-08-29 LAB — CERVICOVAGINAL ANCILLARY ONLY
Bacterial Vaginitis (gardnerella): NEGATIVE
Candida Glabrata: NEGATIVE
Candida Vaginitis: NEGATIVE
Chlamydia: NEGATIVE
Comment: NEGATIVE
Comment: NEGATIVE
Comment: NEGATIVE
Comment: NEGATIVE
Comment: NEGATIVE
Comment: NORMAL
Neisseria Gonorrhea: NEGATIVE
Trichomonas: NEGATIVE

## 2023-10-18 ENCOUNTER — Ambulatory Visit (INDEPENDENT_AMBULATORY_CARE_PROVIDER_SITE_OTHER): Admitting: Family Medicine

## 2023-10-18 ENCOUNTER — Encounter: Payer: Self-pay | Admitting: Family Medicine

## 2023-10-18 VITALS — BP 126/85 | HR 72 | Wt 149.8 lb

## 2023-10-18 DIAGNOSIS — R41840 Attention and concentration deficit: Secondary | ICD-10-CM | POA: Diagnosis not present

## 2023-10-18 DIAGNOSIS — F411 Generalized anxiety disorder: Secondary | ICD-10-CM

## 2023-10-22 ENCOUNTER — Encounter: Payer: Self-pay | Admitting: Family Medicine

## 2023-10-22 NOTE — Progress Notes (Signed)
 Established Patient Office Visit  Subjective    Patient ID: Chelsea Burgess, female    DOB: August 16, 1995  Age: 28 y.o. MRN: [id number]  CC:  Chief Complaint  Patient presents with   Anxiety    Problems with focusing on task, gets anxiety with school    HPI Chelsea Burgess presents with complaint of anxiety and attention deficits were she is unable to focus on tasks and school. Patient reports that she has increased social stressors in several areas.   Outpatient Encounter Medications as of 10/18/2023  Medication Sig   levonorgestrel  (LILETTA , 52 MG,) 20.1 MCG/DAY IUD 1 each by Intrauterine route once.   metroNIDAZOLE  (METROGEL ) 0.75 % vaginal gel Use 1 applicator daily for 7 days at bedtime and now sex or alcohol (Patient not taking: Reported on 10/18/2023)   No facility-administered encounter medications on file as of 10/18/2023.    Past Medical History:  Diagnosis Date   Back pain    Family history of heart attack    Herpes    Medical history non-contributory     Past Surgical History:  Procedure Laterality Date   NO PAST SURGERIES      Family History  Problem Relation Age of Onset   Diabetes Maternal Grandmother    Cancer Maternal Grandfather        colon   Heart attack Father    Stroke Mother    Heart attack Mother    Stroke Maternal Aunt     Social History   Socioeconomic History   Marital status: Significant Other    Spouse name: Not on file   Number of children: 1   Years of education: Not on file   Highest education level: Not on file  Occupational History   Not on file  Tobacco Use   Smoking status: Never   Smokeless tobacco: Never  Vaping Use   Vaping status: Never Used  Substance and Sexual Activity   Alcohol use: Not Currently    Comment: rare   Drug use: Never   Sexual activity: Not Currently    Birth control/protection: I.U.D.  Other Topics Concern   Not on file  Social History Narrative   ** Merged History Encounter **        ** Merged History Encounter **       Social Drivers of Corporate investment banker Strain: Low Risk  (07/04/2020)   Overall Financial Resource Strain (CARDIA)    Difficulty of Paying Living Expenses: Not very hard  Food Insecurity: No Food Insecurity (07/04/2020)   Hunger Vital Sign    Worried About Running Out of Food in the Last Year: Never true    Ran Out of Food in the Last Year: Never true  Transportation Needs: No Transportation Needs (07/04/2020)   PRAPARE - Administrator, Civil Service (Medical): No    Lack of Transportation (Non-Medical): No  Physical Activity: Insufficiently Active (07/04/2020)   Exercise Vital Sign    Days of Exercise per Week: 1 day    Minutes of Exercise per Session: 10 min  Stress: No Stress Concern Present (07/04/2020)   [employer] of Occupational Health - Occupational Stress Questionnaire    Feeling of Stress : Not at all  Social Connections: Moderately Integrated (07/04/2020)   Social Connection and Isolation Panel [NHANES]    Frequency of Communication with Friends and Family: More than three times a week    Frequency of Social Gatherings with Friends and Family: Twice  a week    Attends Religious Services: More than 4 times per year    Active Member of Clubs or Organizations: No    Attends Banker Meetings: Never    Marital Status: Living with partner  Intimate Partner Violence: Not At Risk (07/04/2020)   Humiliation, Afraid, Rape, and Kick questionnaire    Fear of Current or Ex-Partner: No    Emotionally Abused: No    Physically Abused: No    Sexually Abused: No    Review of Systems  All other systems reviewed and are negative.       Objective    BP 126/85 (BP Location: Right Arm, Patient Position: Sitting, Cuff Size: Normal)   Pulse 72   Wt 149 lb 12.8 oz (67.9 kg)   SpO2 98%   BMI 23.46 kg/m   Physical Exam Vitals and nursing note reviewed.  Constitutional:      General: She is not in acute  distress. Cardiovascular:     Rate and Rhythm: Normal rate and regular rhythm.  Pulmonary:     Effort: Pulmonary effort is normal.     Breath sounds: Normal breath sounds.  Abdominal:     Palpations: Abdomen is soft.     Tenderness: There is no abdominal tenderness.  Neurological:     General: No focal deficit present.     Mental Status: She is alert and oriented to person, place, and time.  Psychiatric:        Mood and Affect: Mood is anxious.        Thought Content: Thought content normal.         Assessment & Plan:   Attention deficit -     Ambulatory referral to Psychology  Anxiety state     No follow-ups on file.   Arlo Lama, MD

## 2024-03-17 ENCOUNTER — Ambulatory Visit

## 2024-03-17 ENCOUNTER — Other Ambulatory Visit (HOSPITAL_COMMUNITY)
Admission: RE | Admit: 2024-03-17 | Discharge: 2024-03-17 | Disposition: A | Discharge status: Home / Self Care | Source: Ambulatory Visit | Attending: Obstetrics & Gynecology | Admitting: Obstetrics & Gynecology

## 2024-03-17 DIAGNOSIS — Z113 Encounter for screening for infections with a predominantly sexual mode of transmission: Secondary | ICD-10-CM | POA: Diagnosis present

## 2024-03-17 DIAGNOSIS — R809 Proteinuria, unspecified: Secondary | ICD-10-CM

## 2024-03-17 LAB — POCT URINALYSIS DIPSTICK OB
Blood, UA: NEGATIVE
Glucose, UA: NEGATIVE
Ketones, UA: NEGATIVE
Leukocytes, UA: NEGATIVE
Nitrite, UA: NEGATIVE

## 2024-03-17 NOTE — Progress Notes (Signed)
   NURSE VISIT- STD  SUBJECTIVE:  Chelsea Burgess is a 28 y.o. [id number] GYN patientfemale here for a vaginal swab for STD screen.  She reports the following symptoms: none for 0 days. Denies abnormal vaginal bleeding, significant pelvic pain, fever, or UTI symptoms. Patients states she did just get out of a relationship. Denies symptoms but feels like something is off  OBJECTIVE:  There were no vitals taken for this visit.  Appears well, in no apparent distress  ASSESSMENT: Vaginal swab for STD screen POC Urine dip trace leukocytes and proteins   PLAN: Self-collected vaginal probe for Gonorrhea, Chlamydia, Trichomonas sent to lab Urine sent per patient request.  Treatment: to be determined once results are received Follow-up as needed if symptoms persist/worsen, or new symptoms develop  Aleck FORBES Blase  03/17/2024 9:22 AM

## 2024-03-18 ENCOUNTER — Ambulatory Visit: Payer: Self-pay | Admitting: Adult Health

## 2024-03-18 LAB — CERVICOVAGINAL ANCILLARY ONLY
Chlamydia: NEGATIVE
Comment: NEGATIVE
Comment: NEGATIVE
Comment: NORMAL
Neisseria Gonorrhea: NEGATIVE
Trichomonas: NEGATIVE

## 2024-03-19 LAB — URINE CULTURE

## 2024-03-21 ENCOUNTER — Other Ambulatory Visit: Payer: Self-pay

## 2024-03-21 ENCOUNTER — Emergency Department (HOSPITAL_BASED_OUTPATIENT_CLINIC_OR_DEPARTMENT_OTHER)
Admission: EM | Admit: 2024-03-21 | Discharge: 2024-03-22 | Disposition: A | Discharge status: Home / Self Care | Attending: Emergency Medicine | Admitting: Emergency Medicine

## 2024-03-21 DIAGNOSIS — N730 Acute parametritis and pelvic cellulitis: Secondary | ICD-10-CM | POA: Diagnosis not present

## 2024-03-21 DIAGNOSIS — R1030 Lower abdominal pain, unspecified: Secondary | ICD-10-CM | POA: Diagnosis present

## 2024-03-21 DIAGNOSIS — R102 Pelvic and perineal pain: Secondary | ICD-10-CM

## 2024-03-21 LAB — URINALYSIS, ROUTINE W REFLEX MICROSCOPIC
Bilirubin Urine: NEGATIVE
Glucose, UA: NEGATIVE mg/dL
Hgb urine dipstick: NEGATIVE
Leukocytes,Ua: NEGATIVE
Nitrite: NEGATIVE
Protein, ur: NEGATIVE mg/dL
Specific Gravity, Urine: 1.028 (ref 1.005–1.030)
pH: 6.5 (ref 5.0–8.0)

## 2024-03-21 LAB — COMPREHENSIVE METABOLIC PANEL WITH GFR
ALT: 11 U/L (ref 0–44)
AST: 25 U/L (ref 15–41)
Albumin: 4.7 g/dL (ref 3.5–5.0)
Alkaline Phosphatase: 94 U/L (ref 38–126)
Anion gap: 13 (ref 5–15)
BUN: 13 mg/dL (ref 6–20)
CO2: 23 mmol/L (ref 22–32)
Calcium: 10.1 mg/dL (ref 8.9–10.3)
Chloride: 102 mmol/L (ref 98–111)
Creatinine, Ser: 0.8 mg/dL (ref 0.44–1.00)
GFR, Estimated: >60 mL/min (ref >60)
Glucose, Bld: 92 mg/dL (ref 70–99)
Potassium: 4.2 mmol/L (ref 3.5–5.1)
Sodium: 138 mmol/L (ref 135–145)
Total Bilirubin: 0.4 mg/dL (ref 0.0–1.2)
Total Protein: 7.8 g/dL (ref 6.5–8.1)

## 2024-03-21 LAB — WET PREP, GENITAL
Clue Cells Wet Prep HPF POC: NONE SEEN
Sperm: NONE SEEN
Trich, Wet Prep: NONE SEEN
WBC, Wet Prep HPF POC: <10 (ref <10)
Yeast Wet Prep HPF POC: NONE SEEN

## 2024-03-21 LAB — CBC
HCT: 41.7 % (ref 36.0–46.0)
Hemoglobin: 13.6 g/dL (ref 12.0–15.0)
MCH: 29.1 pg (ref 26.0–34.0)
MCHC: 32.6 g/dL (ref 30.0–36.0)
MCV: 89.3 fL (ref 80.0–100.0)
Platelets: 270 K/uL (ref 150–400)
RBC: 4.67 MIL/uL (ref 3.87–5.11)
RDW: 13.3 % (ref 11.5–15.5)
WBC: 9.6 K/uL (ref 4.0–10.5)
nRBC: 0 % (ref 0.0–0.2)

## 2024-03-21 LAB — LIPASE, BLOOD: Lipase: 26 U/L (ref 11–51)

## 2024-03-21 LAB — PREGNANCY, URINE: Preg Test, Ur: NEGATIVE

## 2024-03-21 MED ORDER — SODIUM CHLORIDE 0.9 % IV SOLN
1.0000 g | Freq: Once | INTRAVENOUS | Status: AC
  Administered 2024-03-21: 1 g via INTRAVENOUS
  Filled 2024-03-21: qty 10

## 2024-03-21 MED ORDER — METRONIDAZOLE 500 MG PO TABS: 500.0000 mg | ORAL_TABLET | Freq: Two times a day (BID) | ORAL | 0 refills | Status: AC

## 2024-03-21 MED ORDER — METRONIDAZOLE 500 MG PO TABS
500.0000 mg | ORAL_TABLET | ORAL | Status: AC
  Administered 2024-03-21: 500 mg via ORAL
  Filled 2024-03-21: qty 1

## 2024-03-21 MED ORDER — DOXYCYCLINE HYCLATE 100 MG PO TABS
100.0000 mg | ORAL_TABLET | Freq: Once | ORAL | Status: AC
  Administered 2024-03-21: 100 mg via ORAL
  Filled 2024-03-21: qty 1

## 2024-03-21 MED ORDER — DOXYCYCLINE HYCLATE 100 MG PO CAPS: 100.0000 mg | ORAL_CAPSULE | Freq: Two times a day (BID) | ORAL | 0 refills | Status: AC

## 2024-03-21 NOTE — ED Triage Notes (Signed)
 Pt POV reporting lower abd pain and vaginal irritation past few days, seen for STD screening 9/23, neg. Denies n/v.

## 2024-03-21 NOTE — ED Provider Notes (Signed)
 [place] EMERGENCY DEPARTMENT AT [facility] Provider Note   CSN: [id number] Arrival date & time: 03/21/24  1946     Patient presents with: No chief complaint on file.   Chelsea Burgess is a 28 y.o. female.  {Add pertinent medical, surgical, social history, OB history to HPI:860} 28 year old female history of chlamydial infection who presents emergency department with vaginal irritation and lower abdominal pain.  Patient reports that over the past week she has developed some pelvic discomfort.  Says it is mild and feels like an irritation on the inside of her vagina and pelvis.  No fevers or chills.  No vaginal discharge.  No dysuria or frequency.  Is sexually active.  Went to urgent care on 9/23 and had a negative gonorrhea, chlamydia, and trichomonas test.  Pain is persisted so decided to come into the emergency department for evaluation       Prior to Admission medications   Medication Sig Start Date End Date Taking? Authorizing Provider  levonorgestrel  (LILETTA , 52 MG,) 20.1 MCG/DAY IUD 1 each by Intrauterine route once.    [provider]  metroNIDAZOLE  (METROGEL ) 0.75 % vaginal gel Use 1 applicator daily for 7 days at bedtime and now sex or alcohol Patient not taking: Reported on 10/18/2023 07/11/23   Signa Delon LABOR, NP    Allergies: Patient has no known allergies.    Review of Systems  Updated Vital Signs BP (!) 133/99   Pulse 77   Temp 98.5 F (36.9 C) (Oral)   Resp 16   Ht 5' 7 (1.702 m)   Wt 63.5 kg   SpO2 100%   BMI 21.93 kg/m   Physical Exam Abdominal:     General: There is no distension.     Palpations: There is no mass.     Tenderness: There is no abdominal tenderness. There is no guarding.  Genitourinary:    Comments: Chaperoned by RN Daphne.  External genitalia unremarkable. Nor rashes or lesions noted.  Speculum exam with yellow/white vaginal discharge.  Vaginal wall mucosa is unremarkable.  Cervix visualized and  is unremarkable (closed in appearance without any protruding material).  Bimanual exam without adnexal tenderness or any masses appreciated. + CMT     (all labs ordered are listed, but only abnormal results are displayed) Labs Reviewed  URINALYSIS, ROUTINE W REFLEX MICROSCOPIC - Abnormal; Notable for the following components:      Result Value   Ketones, ur TRACE (*)    All other components within normal limits  CBC  PREGNANCY, URINE  COMPREHENSIVE METABOLIC PANEL WITH GFR  LIPASE, BLOOD    EKG: None  Radiology: No results found.  {Document cardiac monitor, telemetry assessment procedure when appropriate:32947} Procedures   Medications Ordered in the ED - No data to display    {Click here for ABCD2, HEART and other calculators REFRESH Note before signing:1}                              Medical Decision Making Amount and/or Complexity of Data Reviewed Labs: ordered.  Risk Prescription drug management.   ***  {Document critical care time when appropriate  Document review of labs and clinical decision tools ie CHADS2VASC2, etc  Document your independent review of radiology images and any outside records  Document your discussion with family members, caretakers and with consultants  Document social determinants of health affecting pt's care  Document your decision making why or why not  admission, treatments were needed:32947:::1}   Final diagnoses:  None    ED Discharge Orders     None

## 2024-03-21 NOTE — Discharge Instructions (Signed)
 You were seen for your pelvic pain in the emergency department.  It is likely from pelvic inflammatory disease.  At home, please take the antibiotics who prescribed you (doxycycline  and Flagyl ).    Follow-up with your primary doctor or OB/GYN in 2-3 days regarding your visit.    Return immediately to the emergency department if you experience any of the following: Fever, worsening pain, or any other concerning symptoms.    Thank you for visiting our Emergency Department. It was a pleasure taking care of you today.

## 2024-03-23 LAB — GC/CHLAMYDIA PROBE AMP (~~LOC~~) NOT AT ARMC
Chlamydia: NEGATIVE
Comment: NEGATIVE
Comment: NORMAL
Neisseria Gonorrhea: NEGATIVE

## 2024-04-07 ENCOUNTER — Ambulatory Visit: Admitting: Adult Health

## 2024-04-09 ENCOUNTER — Encounter: Payer: Self-pay | Admitting: Adult Health

## 2024-04-09 ENCOUNTER — Other Ambulatory Visit (HOSPITAL_COMMUNITY)
Admission: RE | Admit: 2024-04-09 | Discharge: 2024-04-09 | Disposition: A | Discharge status: Home / Self Care | Source: Ambulatory Visit | Attending: Adult Health | Admitting: Adult Health

## 2024-04-09 ENCOUNTER — Ambulatory Visit (INDEPENDENT_AMBULATORY_CARE_PROVIDER_SITE_OTHER): Admitting: Adult Health

## 2024-04-09 VITALS — BP 119/86 | HR 79 | Ht 67.0 in | Wt 148.0 lb

## 2024-04-09 DIAGNOSIS — R10813 Right lower quadrant abdominal tenderness: Secondary | ICD-10-CM | POA: Diagnosis not present

## 2024-04-09 DIAGNOSIS — R102 Pelvic and perineal pain unspecified side: Secondary | ICD-10-CM

## 2024-04-09 DIAGNOSIS — Z8742 Personal history of other diseases of the female genital tract: Secondary | ICD-10-CM

## 2024-04-09 DIAGNOSIS — Z124 Encounter for screening for malignant neoplasm of cervix: Secondary | ICD-10-CM | POA: Insufficient documentation

## 2024-04-09 DIAGNOSIS — Z975 Presence of (intrauterine) contraceptive device: Secondary | ICD-10-CM | POA: Diagnosis not present

## 2024-04-09 NOTE — Progress Notes (Signed)
  Subjective:     Patient ID: Chelsea Burgess, female   DOB: 06-08-1996, 28 y.o.   MRN: [id number]  HPI Chelsea Burgess is a 28 year old black female, with SO, [id number] in for follow up on being seen in ER at [facility] 03/21/24  For pelvic pain and was treated for PID with rocephin  1 gm IV and doxycycline  and flagyl . She says she has had pain for 2-3 weeks now, still has some. Has low back pain since daughter was born and has had PT. She needs a pap too.  PCP is Dr Tanda Review of Systems +pelvic pain for 2-3 weeks Low back pain since having daughter Denies problems with urination or bowel movements No itching or burning now No sex in over a month Reviewed past medical,surgical, social and family history. Reviewed medications and allergies.     Objective:   Physical Exam BP 119/86 (BP Location: Right Arm, Patient Position: Sitting, Cuff Size: Normal)   Pulse 79   Ht 5' 7 (1.702 m)   Wt 148 lb (67.1 kg)   BMI 23.18 kg/m     Skin warm and dry.Pelvic: external genitalia is normal in appearance no lesions, vagina: white discharge without odor,urethra has no lesions or masses noted, cervix is everted at on, +IUD strings at os, no CMT,Pap with HR HPV genotyping performed,  uterus: normal size, shape and contour, non tender, no masses felt, adnexa: no masses, mild RLQ tenderness noted. Bladder is non tender and no masses felt.  Upstream - 04/09/24 1141       Pregnancy Intention Screening   Does the patient want to become pregnant in the next year? No    Does the patient's partner want to become pregnant in the next year? No    Would the patient like to discuss contraceptive options today? No      Contraception Wrap Up   Current Method IUD or IUS    End Method IUD or IUS    Contraception Counseling Provided Yes         Examination chaperoned by Clarita Salt LPN  Assessment:     1. Pelvic pain (Primary) +pain for 2-3 weeks Will get US  04/22/24 in office to assess uterus and ovaries   - US  PELVIC COMPLETE WITH TRANSVAGINAL; Future  2. Right lower quadrant abdominal tenderness without rebound tenderness +RLQ tenderness on Exam  Will get US  in office to assess uterus and ovaries and talk when results back  - US  PELVIC COMPLETE WITH TRANSVAGINAL; Future  3. IUD (intrauterine device) in place +strings at os   4. Routine Papanicolaou smear Pap sent Pap in 3 years if negative  - Cytology - PAP( [facility])  5. History of PID Treated in ER 03/21/24 - US  PELVIC COMPLETE WITH TRANSVAGINAL; Future     Plan:     Return in 13 days for pelvic US  in office

## 2024-04-14 ENCOUNTER — Ambulatory Visit: Payer: Self-pay | Admitting: Adult Health

## 2024-04-14 LAB — CYTOLOGY - PAP
Comment: NEGATIVE
Diagnosis: NEGATIVE
High risk HPV: NEGATIVE

## 2024-04-14 MED ORDER — FLUCONAZOLE 150 MG PO TABS: ORAL_TABLET | ORAL | 1 refills | Status: AC

## 2024-04-22 ENCOUNTER — Ambulatory Visit (INDEPENDENT_AMBULATORY_CARE_PROVIDER_SITE_OTHER)

## 2024-04-22 ENCOUNTER — Other Ambulatory Visit: Payer: Self-pay | Admitting: Adult Health

## 2024-04-22 DIAGNOSIS — Z8742 Personal history of other diseases of the female genital tract: Secondary | ICD-10-CM | POA: Diagnosis not present

## 2024-04-22 DIAGNOSIS — R102 Pelvic and perineal pain unspecified side: Secondary | ICD-10-CM

## 2024-04-22 DIAGNOSIS — Z975 Presence of (intrauterine) contraceptive device: Secondary | ICD-10-CM | POA: Diagnosis not present

## 2024-04-22 DIAGNOSIS — R10813 Right lower quadrant abdominal tenderness: Secondary | ICD-10-CM

## 2024-04-22 DIAGNOSIS — Z124 Encounter for screening for malignant neoplasm of cervix: Secondary | ICD-10-CM

## 2024-04-22 NOTE — Progress Notes (Signed)
 PELVIC US  TA/TV: homogeneous retroflexed uterus,WNL,IUD is centrally located within the endometrium,EEC 2.4 mm,normal ovaries,ovaries appear mobile,no pain during ultrasound  Chaperone Maurilio

## 2024-04-24 ENCOUNTER — Ambulatory Visit: Payer: Self-pay | Admitting: Adult Health

## 2024-05-28 ENCOUNTER — Ambulatory Visit: Admitting: Adult Health
